# Patient Record
Sex: Male | Born: 1965 | Race: Black or African American | Hispanic: No | Marital: Married | Smoking: Never smoker
Health system: Southern US, Community
[De-identification: ages and names within clinical notes are randomized; demographics above are authoritative.]

## PROBLEM LIST (undated history)

## (undated) DIAGNOSIS — I10 Essential (primary) hypertension: Secondary | ICD-10-CM

## (undated) DIAGNOSIS — L700 Acne vulgaris: Secondary | ICD-10-CM

## (undated) DIAGNOSIS — G4730 Sleep apnea, unspecified: Secondary | ICD-10-CM

## (undated) DIAGNOSIS — K589 Irritable bowel syndrome without diarrhea: Secondary | ICD-10-CM

## (undated) DIAGNOSIS — K219 Gastro-esophageal reflux disease without esophagitis: Secondary | ICD-10-CM

## (undated) DIAGNOSIS — T7840XA Allergy, unspecified, initial encounter: Secondary | ICD-10-CM

## (undated) DIAGNOSIS — J069 Acute upper respiratory infection, unspecified: Secondary | ICD-10-CM

## (undated) DIAGNOSIS — J111 Influenza due to unidentified influenza virus with other respiratory manifestations: Secondary | ICD-10-CM

## (undated) HISTORY — PX: APPENDECTOMY: SHX54

## (undated) HISTORY — DX: Acute upper respiratory infection, unspecified: J06.9

## (undated) HISTORY — DX: Essential (primary) hypertension: I10

## (undated) HISTORY — DX: Irritable bowel syndrome, unspecified: K58.9

## (undated) HISTORY — PX: COLONOSCOPY: SHX174

## (undated) HISTORY — DX: Gastro-esophageal reflux disease without esophagitis: K21.9

## (undated) HISTORY — PX: OTHER SURGICAL HISTORY: SHX169

## (undated) HISTORY — DX: Sleep apnea, unspecified: G47.30

## (undated) HISTORY — DX: Allergy, unspecified, initial encounter: T78.40XA

## (undated) HISTORY — DX: Acne vulgaris: L70.0

## (undated) HISTORY — DX: Influenza due to unidentified influenza virus with other respiratory manifestations: J11.1

---

## 1999-02-16 ENCOUNTER — Encounter: Payer: Self-pay | Admitting: Emergency Medicine

## 1999-02-16 ENCOUNTER — Emergency Department (HOSPITAL_COMMUNITY): Admission: EM | Admit: 1999-02-16 | Discharge: 1999-02-16 | Payer: Self-pay | Admitting: Emergency Medicine

## 1999-07-26 ENCOUNTER — Emergency Department (HOSPITAL_COMMUNITY): Admission: EM | Admit: 1999-07-26 | Discharge: 1999-07-26 | Payer: Self-pay | Admitting: *Deleted

## 1999-11-12 ENCOUNTER — Encounter: Payer: Self-pay | Admitting: Gastroenterology

## 1999-11-12 ENCOUNTER — Emergency Department (HOSPITAL_COMMUNITY): Admission: RE | Admit: 1999-11-12 | Discharge: 1999-11-12 | Payer: Self-pay | Admitting: Gastroenterology

## 1999-12-10 ENCOUNTER — Inpatient Hospital Stay (HOSPITAL_COMMUNITY): Admission: RE | Admit: 1999-12-10 | Discharge: 1999-12-11 | Payer: Self-pay | Admitting: Surgery

## 1999-12-10 ENCOUNTER — Encounter (INDEPENDENT_AMBULATORY_CARE_PROVIDER_SITE_OTHER): Payer: Self-pay

## 2002-02-22 ENCOUNTER — Emergency Department (HOSPITAL_COMMUNITY): Admission: EM | Admit: 2002-02-22 | Discharge: 2002-02-22 | Payer: Self-pay | Admitting: Emergency Medicine

## 2003-07-11 ENCOUNTER — Ambulatory Visit (HOSPITAL_COMMUNITY): Admission: RE | Admit: 2003-07-11 | Discharge: 2003-07-11 | Payer: Self-pay | Admitting: Surgery

## 2003-07-11 ENCOUNTER — Encounter (INDEPENDENT_AMBULATORY_CARE_PROVIDER_SITE_OTHER): Payer: Self-pay | Admitting: *Deleted

## 2004-07-29 ENCOUNTER — Ambulatory Visit: Payer: Self-pay | Admitting: Internal Medicine

## 2004-09-22 ENCOUNTER — Ambulatory Visit: Payer: Self-pay | Admitting: Internal Medicine

## 2005-04-19 ENCOUNTER — Emergency Department (HOSPITAL_COMMUNITY): Admission: EM | Admit: 2005-04-19 | Discharge: 2005-04-19 | Payer: Self-pay | Admitting: Family Medicine

## 2005-05-11 ENCOUNTER — Ambulatory Visit: Payer: Self-pay | Admitting: Internal Medicine

## 2005-09-02 ENCOUNTER — Ambulatory Visit: Payer: Self-pay | Admitting: Internal Medicine

## 2005-09-06 ENCOUNTER — Ambulatory Visit: Payer: Self-pay | Admitting: Internal Medicine

## 2005-09-10 ENCOUNTER — Ambulatory Visit: Payer: Self-pay | Admitting: Internal Medicine

## 2006-05-06 ENCOUNTER — Ambulatory Visit: Payer: Self-pay | Admitting: Internal Medicine

## 2006-06-24 ENCOUNTER — Encounter: Payer: Self-pay | Admitting: Internal Medicine

## 2006-09-06 ENCOUNTER — Ambulatory Visit: Payer: Self-pay | Admitting: Internal Medicine

## 2006-09-15 ENCOUNTER — Ambulatory Visit: Payer: Self-pay | Admitting: Internal Medicine

## 2007-02-08 ENCOUNTER — Encounter: Payer: Self-pay | Admitting: *Deleted

## 2007-02-08 DIAGNOSIS — K219 Gastro-esophageal reflux disease without esophagitis: Secondary | ICD-10-CM

## 2007-02-08 DIAGNOSIS — K589 Irritable bowel syndrome without diarrhea: Secondary | ICD-10-CM | POA: Insufficient documentation

## 2007-06-13 ENCOUNTER — Ambulatory Visit: Payer: Self-pay | Admitting: Internal Medicine

## 2007-06-13 DIAGNOSIS — I10 Essential (primary) hypertension: Secondary | ICD-10-CM

## 2007-07-28 ENCOUNTER — Encounter: Payer: Self-pay | Admitting: Internal Medicine

## 2008-01-30 ENCOUNTER — Ambulatory Visit: Payer: Self-pay | Admitting: Internal Medicine

## 2008-01-30 DIAGNOSIS — L708 Other acne: Secondary | ICD-10-CM

## 2008-01-30 DIAGNOSIS — J069 Acute upper respiratory infection, unspecified: Secondary | ICD-10-CM | POA: Insufficient documentation

## 2008-02-05 ENCOUNTER — Encounter: Payer: Self-pay | Admitting: Internal Medicine

## 2008-02-07 ENCOUNTER — Telehealth: Payer: Self-pay | Admitting: Internal Medicine

## 2008-06-07 ENCOUNTER — Encounter: Payer: Self-pay | Admitting: Internal Medicine

## 2008-07-25 ENCOUNTER — Ambulatory Visit: Payer: Self-pay | Admitting: Internal Medicine

## 2008-07-25 LAB — CONVERTED CEMR LAB
ALT: 14 units/L (ref 0–53)
AST: 19 units/L (ref 0–37)
Albumin: 3.9 g/dL (ref 3.5–5.2)
Alkaline Phosphatase: 57 units/L (ref 39–117)
BUN: 12 mg/dL (ref 6–23)
Basophils Absolute: 0 10*3/uL (ref 0.0–0.1)
Basophils Relative: 0.5 % (ref 0.0–3.0)
Bilirubin Urine: NEGATIVE
Bilirubin, Direct: 0.1 mg/dL (ref 0.0–0.3)
CO2: 29 meq/L (ref 19–32)
Calcium: 9.3 mg/dL (ref 8.4–10.5)
Chloride: 107 meq/L (ref 96–112)
Cholesterol: 163 mg/dL (ref 0–200)
Creatinine, Ser: 1.2 mg/dL (ref 0.4–1.5)
Crystals: NEGATIVE
Eosinophils Absolute: 0.1 10*3/uL (ref 0.0–0.7)
Eosinophils Relative: 2.8 % (ref 0.0–5.0)
GFR calc Af Amer: 85 mL/min
GFR calc non Af Amer: 71 mL/min
Glucose, Bld: 105 mg/dL — ABNORMAL HIGH (ref 70–99)
HCT: 44.2 % (ref 39.0–52.0)
HDL: 37.1 mg/dL — ABNORMAL LOW (ref >39.0)
Hemoglobin, Urine: NEGATIVE
Hemoglobin: 15.2 g/dL (ref 13.0–17.0)
Ketones, ur: NEGATIVE mg/dL
LDL Cholesterol: 109 mg/dL — ABNORMAL HIGH (ref 0–99)
Lymphocytes Relative: 35.4 % (ref 12.0–46.0)
MCHC: 34.4 g/dL (ref 30.0–36.0)
MCV: 88.3 fL (ref 78.0–100.0)
Monocytes Absolute: 0.6 10*3/uL (ref 0.1–1.0)
Monocytes Relative: 13.7 % — ABNORMAL HIGH (ref 3.0–12.0)
Neutro Abs: 1.9 10*3/uL (ref 1.4–7.7)
Neutrophils Relative %: 47.6 % (ref 43.0–77.0)
Nitrite: NEGATIVE
Platelets: 267 10*3/uL (ref 150–400)
Potassium: 3.5 meq/L (ref 3.5–5.1)
RBC: 5 M/uL (ref 4.22–5.81)
RDW: 12.8 % (ref 11.5–14.6)
Sodium: 142 meq/L (ref 135–145)
Specific Gravity, Urine: 1.025 (ref 1.000–1.035)
Squamous Epithelial / HPF: NEGATIVE /lpf
TSH: 1.64 microintl units/mL (ref 0.35–5.50)
Total Bilirubin: 0.8 mg/dL (ref 0.3–1.2)
Total CHOL/HDL Ratio: 4.4
Total Protein, Urine: NEGATIVE mg/dL
Total Protein: 7.2 g/dL (ref 6.0–8.3)
Triglycerides: 86 mg/dL (ref 0–149)
Urine Glucose: NEGATIVE mg/dL
Urobilinogen, UA: 0.2 (ref 0.0–1.0)
VLDL: 17 mg/dL (ref 0–40)
WBC: 4.1 10*3/uL — ABNORMAL LOW (ref 4.5–10.5)
pH: 5.5 (ref 5.0–8.0)

## 2008-08-20 ENCOUNTER — Ambulatory Visit: Payer: Self-pay | Admitting: Internal Medicine

## 2008-08-28 ENCOUNTER — Encounter: Payer: Self-pay | Admitting: Internal Medicine

## 2008-08-29 ENCOUNTER — Telehealth: Payer: Self-pay | Admitting: Internal Medicine

## 2008-09-19 ENCOUNTER — Telehealth: Payer: Self-pay | Admitting: Internal Medicine

## 2008-10-01 ENCOUNTER — Encounter: Payer: Self-pay | Admitting: Internal Medicine

## 2008-10-02 ENCOUNTER — Telehealth: Payer: Self-pay | Admitting: Internal Medicine

## 2008-10-27 ENCOUNTER — Emergency Department (HOSPITAL_COMMUNITY): Admission: EM | Admit: 2008-10-27 | Discharge: 2008-10-27 | Payer: Self-pay | Admitting: Family Medicine

## 2008-10-27 ENCOUNTER — Emergency Department (HOSPITAL_COMMUNITY): Admission: EM | Admit: 2008-10-27 | Discharge: 2008-10-27 | Payer: Self-pay | Admitting: Emergency Medicine

## 2009-04-22 ENCOUNTER — Telehealth: Payer: Self-pay | Admitting: Internal Medicine

## 2009-04-25 ENCOUNTER — Ambulatory Visit: Payer: Self-pay | Admitting: Internal Medicine

## 2009-06-24 ENCOUNTER — Telehealth (INDEPENDENT_AMBULATORY_CARE_PROVIDER_SITE_OTHER): Payer: Self-pay | Admitting: *Deleted

## 2009-06-24 ENCOUNTER — Ambulatory Visit: Payer: Self-pay | Admitting: Internal Medicine

## 2009-06-24 DIAGNOSIS — M25519 Pain in unspecified shoulder: Secondary | ICD-10-CM

## 2009-06-25 ENCOUNTER — Telehealth: Payer: Self-pay | Admitting: Internal Medicine

## 2009-07-09 ENCOUNTER — Telehealth: Payer: Self-pay | Admitting: Internal Medicine

## 2009-08-15 ENCOUNTER — Encounter: Payer: Self-pay | Admitting: Internal Medicine

## 2009-08-18 ENCOUNTER — Telehealth: Payer: Self-pay | Admitting: Internal Medicine

## 2009-08-26 ENCOUNTER — Ambulatory Visit: Payer: Self-pay | Admitting: Internal Medicine

## 2009-08-26 DIAGNOSIS — E559 Vitamin D deficiency, unspecified: Secondary | ICD-10-CM

## 2009-08-26 LAB — CONVERTED CEMR LAB
BUN: 12 mg/dL (ref 6–23)
CO2: 28 meq/L (ref 19–32)
Calcium: 9.2 mg/dL (ref 8.4–10.5)
Chloride: 104 meq/L (ref 96–112)
Creatinine, Ser: 1 mg/dL (ref 0.4–1.5)
GFR calc non Af Amer: 104.39 mL/min (ref >60)
Glucose, Bld: 94 mg/dL (ref 70–99)
Potassium: 3.9 meq/L (ref 3.5–5.1)
Sodium: 138 meq/L (ref 135–145)

## 2009-08-27 DIAGNOSIS — G4733 Obstructive sleep apnea (adult) (pediatric): Secondary | ICD-10-CM

## 2009-08-27 DIAGNOSIS — N529 Male erectile dysfunction, unspecified: Secondary | ICD-10-CM

## 2009-11-14 ENCOUNTER — Telehealth: Payer: Self-pay | Admitting: Internal Medicine

## 2010-01-30 ENCOUNTER — Telehealth: Payer: Self-pay | Admitting: Internal Medicine

## 2010-05-08 ENCOUNTER — Telehealth: Payer: Self-pay | Admitting: Internal Medicine

## 2010-06-23 NOTE — Progress Notes (Signed)
  Phone Note Refill Request Message from:  Fax from Pharmacy  Refills Requested: Medication #1:  BYSTOLIC 5 MG TABS 1 by mouth once daily   Last Refilled: 12/06/2009 Initial call taken by: Ami Bullins CMA,  January 30, 2010 11:35 AM  Follow-up for Phone Call        ok for as needed refills Follow-up by: Jacques Navy MD,  January 30, 2010 1:07 PM    Prescriptions: BYSTOLIC 5 MG TABS (NEBIVOLOL HCL) 1 by mouth once daily  #30 x 7   Entered by:   Ami Bullins CMA   Authorized by:   Jacques Navy MD   Signed by:   Bill Salinas CMA on 01/30/2010   Method used:   [facility] [address] [id number]       [address]       [place], [place]  [address]       Ph: [phone]       Fax: [phone]   RxID:   [id number]

## 2010-06-23 NOTE — Progress Notes (Signed)
Summary: BP med  Phone Note Call from Patient   Summary of Call: I spoke with patient in regards to his BP meds. Patient misunderstood MD yesterday and thought that he should d/c his Benicar. I advised the patient that office visit note says to add Bystolic to daily regimen and report back with BP readings. Patient is now aware to take both Benicar and Bystolic. Initial call taken by: Lucious Groves,  June 25, 2009 9:01 AM

## 2010-06-23 NOTE — Progress Notes (Signed)
Summary: RX request  Phone Note Call from Patient Call back at Home Phone [phone] Call back at [phone]   Summary of Call: Patient called for prescriptions of Bystolic and Nasonex. Patient aware we will send to pharmacy. Please advise regarding Nasonex. Initial call taken by: Lucious Groves,  July 09, 2009 2:11 PM  Follow-up for Phone Call        I don't find nasonex on med list nor mentioned in my last office note. When was this started? Does he carry the diagnosis of allergic rhinnitis. OK to send in bystolic now and will need more information re: nasonex Follow-up by: Jacques Navy MD,  July 09, 2009 3:31 PM  Additional Follow-up for Phone Call Additional follow up Details #1::        When I spoke with the patient earlier he insisted that he was placed on this med previously but did not need it for an extended period of time. Left message on machine to call back to office. Additional Follow-up by: Lucious Groves,  July 09, 2009 4:09 PM    Additional Follow-up for Phone Call Additional follow up Details #2::    1.Spoke with pt: He is c/o problems with urine flow. Trouble starting and sometimes flow stops & starts. These issues started after taking bystolic. Are they a side effect?  2. Pt c/o seasonal allergies and uses flonase during spring. Ok for rx?   .......................Marland KitchenLamar Sprinkles, CMA  July 11, 2009 1:37 PM   Additional Follow-up for Phone Call Additional follow up Details #3:: Details for Additional Follow-up Action Taken: 1. No symptoms of bystolic 2. OK for flonase  tried calling - left message on cell phone Additional Follow-up by: Jacques Navy MD,  July 11, 2009 6:34 PM  New/Updated Medications: FLONASE 50 MCG/ACT SUSP (FLUTICASONE PROPIONATE) 2 sprays to each nostril daily Prescriptions: FLONASE 50 MCG/ACT SUSP (FLUTICASONE PROPIONATE) 2 sprays to each nostril daily  #1 x 12   Entered and Authorized by:   Jacques Navy MD  Signed by:   Jacques Navy MD on 07/11/2009   Method used:   [facility]        [facility] [address] [phone][facility] ([facility]ail[facility]       [address]       [place], [place]  [address]       Ph: [phone]       Fax: [phone]   RxID:   [id number] BYSTOLIC 5 MG TABS (NEBIVOLOL HCL) 1 by mouth once daily  #30 x 3   Entered by:   Lucious Groves   Authorized by:   Jacques Navy MD   Signed by:   Lucious Groves on 07/09/2009   Method used:   [facility]        [facility] [facility] [address] [phone][facility] (retail[facility]       [address]       [place], [place]  [address]       Ph: [phone]       Fax: [phone]   RxID:   [id number]

## 2010-06-23 NOTE — Letter (Signed)
Summary: [facility]  [facility]   Imported By: Maryln Gottron 08/22/2009 09:56:27  _____________________________________________________________________  External Attachment:    Type:   Image     Comment:   External Document

## 2010-06-23 NOTE — Assessment & Plan Note (Signed)
Summary: PHYSICAL--STC   Vital Signs:  Patient profile:   45 year old male Height:      69 inches Weight:      209.75 pounds BMI:     31.09 O2 Sat:      94 % on Room air Temp:     97.0 degrees F oral Pulse rate:   77 / minute Pulse rhythm:   regular BP sitting:   128 / 84 Cuff size:   large  Vitals Entered By: Brenton Grills (August 26, 2009 1:19 PM)  O2 Flow:  Room air CC: pt here for cpx/last eye exam was in 2010/aj  Vision Screening:      Vision Comments: last eye exam was in 2010   Primary Care Provider:  Katricia Prehn  CC:  pt here for cpx/last eye exam was in 2010/aj.  History of Present Illness: Patient presents for routine medical follow-up. He has seen Dr. Patsi Sears - he is followed for BPH and ED. He has been trained to do PEP cavernous injection. His BP has been variable but controlled today. He has had left rotator cuff surgery but is still having pain and limitation in ROM. He continues to do physical therapy.   He has had a sleep study. Question if he needs CPAP.    Current Medications (verified): 1)  Excedrin Extra Strength 250-250-65 Mg  Tabs (Aspirin-Acetaminophen-Caffeine) .... As Needed 2)  Claritin-D 12 Hour 5-120 Mg Xr12h-Tab (Loratadine-Pseudoephedrine) .... Take 1 Tablet By Mouth Once A Day 3)  Benicar 20 Mg Tabs (Olmesartan Medoxomil) .[name].. 1 Once Daily 4)  Vicodin 5-500 Mg Tabs (Hydrocodone-Acetaminophen) .... As Needed 5)  Oxycodone-Acetaminophen 5-325 Mg Tabs (Oxycodone-Acetaminophen) .[name].. 1 To 2 Tabs Q 4 Hrs As Needed 6)  Methocarbamol 500 Mg Tabs (Methocarbamol) .[name].. 1 Tab By Mouth Every 8 Hours 7)  Bystolic 5 Mg Tabs (Nebivolol Hcl) .[name].. 1 By Mouth Once Daily 8)  Maxalt 10 Mg Tabs (Rizatriptan Benzoate) .[name].. 1 By Mouth At On-Set of Headach 9)  Flonase 50 Mcg/act Susp (Fluticasone Propionate) .... 2 Sprays To Each Nostril Daily 10)  Vitamin D2 1.5 Mg (50000 Units) .... Take 1 Cap Weekly  Allergies (verified): No Known Drug Allergies  Past  History:  Past Medical History: Last updated: 08/20/2008 CYSTIC ACNE (ICD-706.1) URI (ICD-465.9) UNSPECIFIED ESSENTIAL HYPERTENSION (ICD-401.9) IBS (ICD-564.1) GERD (ICD-530.81)  Past Surgical History: Last updated: 06/24/2009 Appendectomy Arthroscopic surgery left shoulder for bone spurs Jan '11  Family History: Last updated: 01/30/2008 father - 72; HTN mother - deceased @54 : breast cancer Sister - HTN Neg- colon or prostate cancer; DM, CAD  Social History: Last updated: 01/30/2008 HSG, Adah Perl STate - [employer] business married -'94 1 daughter - '2002 work: Corporate treasurer Engineer, production)  Risk Factors: Alcohol Use: 0 (08/20/2008) Diet: follows a pretty good diet (08/20/2008) Exercise: yes (08/20/2008)  Risk Factors: Smoking Status: quit (08/20/2008)  Review of Systems  The patient denies anorexia, fever, weight loss, weight gain, hoarseness, chest pain, syncope, dyspnea on exertion, prolonged cough, hemoptysis, abdominal pain, hematochezia, incontinence, muscle weakness, suspicious skin lesions, difficulty walking, unusual weight change, enlarged lymph nodes, and angioedema.    Physical Exam  General:  Well-developed,well-nourished,in no acute distress; alert,appropriate and cooperative throughout examination. Atheletic Head:  Normocephalic and atraumatic without obvious abnormalities. No apparent alopecia or balding. Eyes:  No corneal or conjunctival inflammation noted. EOMI. Perrla. Funduscopic exam benign, without hemorrhages, exudates or papilledema. Vision grossly normal. Ears:  R ear normal and L ear normal.   Nose:  no external  deformity and no external erythema.   Mouth:  Oral mucosa and oropharynx without lesions or exudates.  Teeth in good repair. Neck:  supple and full ROM.   Chest Wall:  no deformities and no tenderness.   Lungs:  Normal respiratory effort, chest expands symmetrically. Lungs are clear to auscultation, no crackles or wheezes. Heart:   Normal rate and regular rhythm. S1 and S2 normal without gallop, murmur, click, rub or other extra sounds. Abdomen:  soft, non-tender, normal bowel sounds, no guarding, and no rigidity.   Msk:  decreased ROM left shoulder otherwise normal exam Pulses:  2+ radial Extremities:  No clubbing, cyanosis, edema, or deformity noted with normal full range of motion of all joints.   Neurologic:  alert & oriented X3, cranial nerves II-XII intact, strength normal in all extremities, sensation intact to light touch, gait normal, and DTRs symmetrical and normal.   Skin:  turgor normal, color normal, no rashes, no petechiae, and no ulcerations.   Cervical Nodes:  No lymphadenopathy noted Psych:  Oriented X3, memory intact for recent and remote, normally interactive, and good eye contact.     Impression & Recommendations:  Problem # 1:  SHOULDER PAIN, LEFT (ICD-719.41) On-going problem with residual reduced function. He is still unable to return to any activity that requires normal strength or ROM. He does continue with PT.  His updated medication list for this problem includes:    Excedrin Extra Strength 250-250-65 Mg Tabs (Aspirin-acetaminophen-caffeine) .[name]... As needed    Vicodin 5-500 Mg Tabs (Hydrocodone-acetaminophen) .[name]... As needed    Oxycodone-acetaminophen 5-325 Mg Tabs (Oxycodone-acetaminophen) .[name]... 1 to 2 tabs q 4 hrs as needed    Methocarbamol 500 Mg Tabs (Methocarbamol) .[name]... 1 tab by mouth every 8 hours  Problem # 2:  UNSPECIFIED ESSENTIAL HYPERTENSION (ICD-401.9)  His updated medication list for this problem includes:    Benicar 20 Mg Tabs (Olmesartan medoxomil) .[name]... 1 once daily    Bystolic 5 Mg Tabs (Nebivolol hcl) .[name]... 1 by mouth once daily  Orders: TLB-BMP (Basic Metabolic Panel-BMET) (80048-METABOL)  BP today: 128/84 Prior BP: 148/94 (06/24/2009)  Good control on present medications.  Problem # 3:  IBS (ICD-564.1) Stable with no active compliants, on no  medications.  Problem # 4:  ERECTILE DYSFUNCTION, ORGANIC (ICD-607.84) Dr. Cyndie Mull is managing this problem. Patient has been taught to self-administer PEP cavernous injections. He did have a PSA checked which was in normal range.  Problem # 5:  SLEEP APNEA, OBSTRUCTIVE (ICD-327.23) Previous sleep study done at [facility] received and reviewed. He does have mild-moderate OSA with 16 events/hr. He never followed up with Dr. Marylou Flesher for possible treatment.  Plan - sleep consult with either Dr. Marylou Flesher or [facility] for possible CPAP trial  Problem # 6:  VITAMIN D DEFICIENCY (ICD-268.9) Level checked at Urology = <10.  Plan - ergocalciferol 50,000 weekly as prescribed by Dr. Patsi Sears x 16 weeks  Problem # 7:  Preventive Health Care (ICD-V70.0) Normal limited exam. Normal GU exam including prostate and normal PSA. Current for general health care. He will be resuming stationary bike and does work on [employer].  IN summary - a very nice man who is generally medically stable. He will return as needed. He will let us know if he needs assistance scheduling sleep consult follow-up.  Complete Medication List: 1)  Excedrin Extra Strength 250-250-65 Mg Tabs (Aspirin-acetaminophen-caffeine) .... As needed 2)  Claritin-d 12 Hour 5-120 Mg Xr12h-tab (Loratadine-pseudoephedrine) .... Take 1 tablet by mouth once  a day 3)  Benicar 20 Mg Tabs (Olmesartan medoxomil) .[name].. 1 once daily 4)  Vicodin 5-500 Mg Tabs (Hydrocodone-acetaminophen) .... As needed 5)  Oxycodone-acetaminophen 5-325 Mg Tabs (Oxycodone-acetaminophen) .[name].. 1 to 2 tabs q 4 hrs as needed 6)  Methocarbamol 500 Mg Tabs (Methocarbamol) .[name].. 1 tab by mouth every 8 hours 7)  Bystolic 5 Mg Tabs (Nebivolol hcl) .[name].. 1 by mouth once daily 8)  Maxalt 10 Mg Tabs (Rizatriptan benzoate) .[name].. 1 by mouth at on-set of headach 9)  Flonase 50 Mcg/act Susp (Fluticasone propionate) .... 2 sprays to each nostril daily 10)  Vitamin D2 1.5  Mg (50000 Units)  .... Take 1 cap weekly  Patient: Corey Oliver Note: All result statuses are Final unless otherwise noted.  Tests: (1) BMP (METABOL)   Sodium                    138 mEq/L                   135-145   Potassium                 3.9 mEq/L                   3.5-5.1   Chloride                  104 mEq/L                   96-112   Carbon Dioxide            28 mEq/L                    19-32   Glucose                   94 mg/dL                    09-81   BUN                       12 mg/dL                    1-91   Creatinine                1.0 mg/dL                   4.7-8.2   Calcium                   9.2 mg/dL                   9.5-62.1   GFR                       104.39 mL/min               >60Prescriptions: MAXALT 10 MG TABS (RIZATRIPTAN BENZOATE) 1 by mouth at on-set of headach  #12 x 12   Entered and Authorized by:   Jacques Navy MD   Signed by:   Jacques Navy MD on 08/26/2009   Method used:   [facility]        [facility]  [address] [phone][facility] ([facility]ail)       [address]       [place], [place]  [address]  Ph: [phone]       Fax: [phone]   RxID:   [id number]

## 2010-06-23 NOTE — Assessment & Plan Note (Signed)
Summary: xray   Vital Signs:  Patient profile:   45 year old male Height:      69 inches Weight:      208 pounds BMI:     30.83 O2 Sat:      97 % on Room air Temp:     97.9 degrees F oral Pulse rate:   94 / minute BP sitting:   148 / 94  (left arm) Cuff size:   large  Vitals Entered By: Bill Salinas CMA (June 24, 2009 11:18 AM)  O2 Flow:  Room air CC: pt here for evaluation of elevated BP, he states his BP has been up since shoulder surg 2 weeks ago. Pt has also been having migraines with one instance of vomitting/ ab   Primary Care Provider:  Kristell Wooding  CC:  pt here for evaluation of elevated BP and he states his BP has been up since shoulder surg 2 weeks ago. Pt has also been having migraines with one instance of vomitting/ ab.  History of Present Illness: Recently had arthroscopic left shoulder surgery. At that time he had breathing trouble and elevated blood pressure. He has had several severe headaches: with nausea and emesis. He stopped taking excedrin as a possible driver of blood pressure.   Patient does share that he had a positive sleep study but never followed-up for CPAP assessment. Patient will get release.  He  does have a h/o migraine but reports that he did not have a good response to imitrex.    Current Medications (verified): 1)  Excedrin Extra Strength 250-250-65 Mg  Tabs (Aspirin-Acetaminophen-Caffeine) .... As Needed 2)  Claritin-D 12 Hour 5-120 Mg Xr12h-Tab (Loratadine-Pseudoephedrine) .... Take 1 Tablet By Mouth Once A Day 3)  Benicar 20 Mg Tabs (Olmesartan Medoxomil) .[name].. 1 Once Daily 4)  Vicodin 5-500 Mg Tabs (Hydrocodone-Acetaminophen) .... As Needed 5)  Oxycodone-Acetaminophen 5-325 Mg Tabs (Oxycodone-Acetaminophen) .[name].. 1 To 2 Tabs Q 4 Hrs As Needed 6)  Methocarbamol 500 Mg Tabs (Methocarbamol) .[name].. 1 Tab By Mouth Every 8 Hours  Allergies (verified): No Known Drug Allergies  Past History:  Past Medical History: Last updated:  08/20/2008 CYSTIC ACNE (ICD-706.1) URI (ICD-465.9) UNSPECIFIED ESSENTIAL HYPERTENSION (ICD-401.9) IBS (ICD-564.1) GERD (ICD-530.81)  Past Surgical History: Appendectomy Arthroscopic surgery left shoulder for bone spurs Jan '11  Review of Systems       The patient complains of weight gain and headaches.  The patient denies anorexia, fever, decreased hearing, hoarseness, chest pain, syncope, dyspnea on exertion, prolonged cough, abdominal pain, severe indigestion/heartburn, muscle weakness, suspicious skin lesions, difficulty walking, unusual weight change, and enlarged lymph nodes.    Physical Exam  General:  WNWD atheletic appearing AA male in no distress Head:  normocephalic and atraumatic.   Eyes:  pupils equal and pupils round.   Neck:  supple and full ROM.   Lungs:  normal respiratory effort and normal breath sounds.   Heart:  normal rate and regular rhythm.   Msk:  decdreased ROM left shoulder Pulses:  2+ radial Neurologic:  alert & oriented X3, cranial nerves II-XII intact, and gait normal.   Skin:  turgor normal, color normal, and no rashes.   Cervical Nodes:  no anterior cervical adenopathy and no posterior cervical adenopathy.   Psych:  Oriented X3, memory intact for recent and remote, normally interactive, and good eye contact.     Impression & Recommendations:  Problem # 1:  UNSPECIFIED ESSENTIAL HYPERTENSION (ICD-401.9)  His updated medication list for this problem includes:  Benicar 20 Mg Tabs (Olmesartan medoxomil) .[name]... 1 once daily    Bystolic 5 Mg Tabs (Nebivolol hcl) .[name]... 1 by mouth once daily  BP today: 148/94 Prior BP: 138/98 (04/25/2009)  Labs Reviewed: K+: 3.5 (07/25/2008) Creat: : 1.2 (07/25/2008)   Chol: 163 (07/25/2008)   HDL: 37.1 (07/25/2008)   LDL: 109 (07/25/2008)   TG: 86 (07/25/2008)  Suboptimal control  Plan - add bystolic 5mg  once daily to regimen. Report back BP readings  Problem # 2:  SHOULDER PAIN, LEFT (ICD-719.41) s/p  surgery. To start PT Monday Feb 7th  His updated medication list for this problem includes:    Excedrin Extra Strength 250-250-65 Mg Tabs (Aspirin-acetaminophen-caffeine) .[name]... As needed    Vicodin 5-500 Mg Tabs (Hydrocodone-acetaminophen) .[name]... As needed    Oxycodone-acetaminophen 5-325 Mg Tabs (Oxycodone-acetaminophen) .[name]... 1 to 2 tabs q 4 hrs as needed    Methocarbamol 500 Mg Tabs (Methocarbamol) .[name]... 1 tab by mouth every 8 hours  Complete Medication List: 1)  Excedrin Extra Strength 250-250-65 Mg Tabs (Aspirin-acetaminophen-caffeine) .... As needed 2)  Claritin-d 12 Hour 5-120 Mg Xr12h-tab (Loratadine-pseudoephedrine) .... Take 1 tablet by mouth once a day 3)  Benicar 20 Mg Tabs (Olmesartan medoxomil) .[name].. 1 once daily 4)  Vicodin 5-500 Mg Tabs (Hydrocodone-acetaminophen) .... As needed 5)  Oxycodone-acetaminophen 5-325 Mg Tabs (Oxycodone-acetaminophen) .[name].. 1 to 2 tabs q 4 hrs as needed 6)  Methocarbamol 500 Mg Tabs (Methocarbamol) .[name].. 1 tab by mouth every 8 hours 7)  Bystolic 5 Mg Tabs (Nebivolol hcl) .[name].. 1 by mouth once daily 8)  Maxalt 10 Mg Tabs (Rizatriptan benzoate) .[name].. 1 by mouth at on-set of headach

## 2010-06-23 NOTE — Progress Notes (Signed)
Summary: Ibuprofen refill  Phone Note Call from Patient Call back at Home Phone [phone] Call back at [phone]   Summary of Call: Patient requests refill of Ibuprofen 800mg . This is currently not on med list. Please advise. Initial call taken by: Lucious Groves,  November 14, 2009 3:43 PM  Follow-up for Phone Call        OK for ibuprofen 800mg  #100, sig 1 by mouth three times a day, refill as needed  Follow-up by: Jacques Navy MD,  November 14, 2009 4:10 PM  Additional Follow-up for Phone Call Additional follow up Details #1::        Did not reach patient at home, tried cell, left message on voicemail notifying. Additional Follow-up by: Lucious Groves,  November 14, 2009 4:14 PM    New/Updated Medications: IBUPROFEN 800 MG TABS (IBUPROFEN) 1 by mouth three times a day Prescriptions: IBUPROFEN 800 MG TABS (IBUPROFEN) 1 by mouth three times a day  #100 x 11   Entered by:   Lucious Groves   Authorized by:   Jacques Navy MD   Signed by:   Lucious Groves on 11/14/2009   Method used:   [facility]        [facility]  [address] [phone]       [address]       [place], [place]  [address]       Ph: [phone]       Fax: [phone]   RxID:   [id number]

## 2010-06-23 NOTE — Progress Notes (Signed)
Summary: BP  Phone Note Call from Patient   Summary of Call: Pt took his last Bp pills today. Says at urologist he was told that bp was "high". He has machine at home and will bring in or call office with readings. He does not want to fill rx's if they will possibly be changed. Provided pt with 3 wks of samples.  Initial call taken by: Lamar Sprinkles, CMA,  August 18, 2009 5:06 PM  Follow-up for Phone Call        Patient on bystolic 5, benicar 20 and had been doing OK. Will look to see home readings and make appropriate adjustments Follow-up by: Jacques Navy MD,  August 18, 2009 6:22 PM

## 2010-06-23 NOTE — Progress Notes (Signed)
Summary: Medical release completed  Medical release completed and faxed to [facility] at [phone]. Dena Chavis  June 24, 2009 12:11 PM

## 2010-06-25 NOTE — Progress Notes (Signed)
Summary: Referral ?  Phone Note Call from Patient Call back at Home Phone [phone]   Summary of Call: Pt has talked to MD regarding blood in his stool. He wants to know if MD thinks he should have colonscopy.  Initial call taken by: Lamar Sprinkles, CMA,  May 08, 2010 4:54 PM  Follow-up for Phone Call        patient seen for hematochezia Apr 25, 2009. No report in subsequent two visits of recurrent blood in the stool.  Recent blood in stool? If yes can schedule diagnostic colonoscopy. Follow-up by: Jacques Navy MD,  May 08, 2010 5:55 PM  Additional Follow-up for Phone Call Additional follow up Details #1::        no answer...............Marland KitchenLamar Sprinkles, CMA  May 11, 2010 5:31 PM   Spoke w/pt. He had one episode of bright red blood during BM which was hard dry stool. No further bleeding. Patient is requesting rx for suppositiores & to know if MD thinks he needs colonoscopy?  Additional Follow-up by: Lamar Sprinkles, CMA,  May 12, 2010 12:46 PM    Additional Follow-up for Phone Call Additional follow up Details #2::    bright red blood on the stool after bowel movement is usually hemorrhoid or tear. If he wants colonoscopy this can meet criteria for diagnositc study. Will refer to GI if he wishes to proceed.  Anusol HC 25mg  suppositories #12, 1 pr two times a day x 6, refill x 2 Follow-up by: Jacques Navy MD,  May 12, 2010 1:10 PM  Additional Follow-up for Phone Call Additional follow up Details #3:: Details for Additional Follow-up Action Taken: Pt informed, he will wait on colon & try med Additional Follow-up by: Lamar Sprinkles, CMA,  May 12, 2010 3:02 PM  New/Updated Medications: ANUSOL-HC 25 MG SUPP (HYDROCORTISONE ACETATE) once per rectum two times a day x 6 days Prescriptions: ANUSOL-HC 25 MG SUPP (HYDROCORTISONE ACETATE) once per rectum two times a day x 6 days  #12 x 2   Entered by:   Lamar Sprinkles, CMA   Authorized by:   Jacques Navy MD   Signed by:   Lamar Sprinkles, CMA on 05/12/2010   Method used:   [facility]        [facility]  [address] [phone][facility] ([facility]ail[facility]       [address]       [place], [place]  [address]       Ph: [phone]       Fax: [phone]   RxID:   [id number]

## 2010-08-03 ENCOUNTER — Telehealth: Payer: Self-pay | Admitting: Internal Medicine

## 2010-08-07 ENCOUNTER — Telehealth: Payer: Self-pay | Admitting: Internal Medicine

## 2010-08-11 NOTE — Progress Notes (Signed)
  Phone Note Refill Request Message from:  Fax from Pharmacy on August 07, 2010 8:54 AM  Refills Requested: Medication #1:  FLONASE 50 MCG/ACT SUSP 2 sprays to each nostril daily Initial call taken by: Ami Bullins CMA,  August 07, 2010 8:54 AM    Prescriptions: FLONASE 50 MCG/ACT SUSP (FLUTICASONE PROPIONATE) 2 sprays to each nostril daily  #1 x 12   Entered by:   Ami Bullins CMA   Authorized by:   Jacques Navy MD   Signed by:   Bill Salinas CMA on 08/07/2010   Method used:   [facility]        [facility]  [address] [id number]       [address]       [place], [place]  [address]       Ph: [phone]       Fax: [phone]   RxID:   [id number]

## 2010-08-11 NOTE — Progress Notes (Signed)
Summary: Rx refill  Phone Note Call from Patient Call back at Home Phone [phone]   Caller: [phone] or [phone] Call For: Jacques Navy MD Summary of Call: Pt sched appt for his yearly Cpx in May. Pt is running out of Bp meds. Can pt get a refill [facility] to [facility] to hold him over to appt please? Initial call taken by: Verdell Face,  August 03, 2010 2:35 PM    Prescriptions: BENICAR 20 MG TABS (OLMESARTAN MEDOXOMIL) 1 once daily  #30 x 1   Entered by:   Margaret Pyle, CMA   Authorized by:   Jacques Navy MD   Signed by:   Margaret Pyle, CMA on 08/03/2010   Method used:   Electronically to        [facility]  [address] [phone]* (retail)       [address]       [place], [place]  [address]       Ph: [phone]       Fax: [phone]   RxID:   [id number] BYSTOLIC 5 MG TABS (NEBIVOLOL HCL) 1 by mouth once daily  #30 x 1   Entered by:   Margaret Pyle, CMA   Authorized by:   Jacques Navy MD   Signed by:   Margaret Pyle, CMA on 08/03/2010   Method used:   [facility]ically [facility]        [facility]  [address] [phone]* (retail)       [address]       [place], [place]  [address]       Ph: [phone]       Fax: [phone]   RxID:   [id number]

## 2010-09-15 ENCOUNTER — Other Ambulatory Visit (INDEPENDENT_AMBULATORY_CARE_PROVIDER_SITE_OTHER): Payer: 59

## 2010-09-15 DIAGNOSIS — Z0389 Encounter for observation for other suspected diseases and conditions ruled out: Secondary | ICD-10-CM

## 2010-09-15 DIAGNOSIS — Z0000 Encounter for general adult medical examination without abnormal findings: Secondary | ICD-10-CM

## 2010-09-15 LAB — CBC WITH DIFFERENTIAL/PLATELET
Basophils Relative: 1 % (ref 0.0–3.0)
HCT: 44.1 % (ref 39.0–52.0)
Hemoglobin: 15.1 g/dL (ref 13.0–17.0)
Lymphocytes Relative: 31.5 % (ref 12.0–46.0)
Lymphs Abs: 1.5 10*3/uL (ref 0.7–4.0)
MCHC: 34.2 g/dL (ref 30.0–36.0)
Monocytes Relative: 10.1 % (ref 3.0–12.0)
Neutro Abs: 2.7 10*3/uL (ref 1.4–7.7)
RBC: 4.94 Mil/uL (ref 4.22–5.81)
RDW: 13.3 % (ref 11.5–14.6)

## 2010-09-15 LAB — HEPATIC FUNCTION PANEL
ALT: 15 U/L (ref 0–53)
AST: 22 U/L (ref 0–37)
Albumin: 4.2 g/dL (ref 3.5–5.2)
Alkaline Phosphatase: 60 U/L (ref 39–117)

## 2010-09-15 LAB — BASIC METABOLIC PANEL
BUN: 16 mg/dL (ref 6–23)
Chloride: 106 mEq/L (ref 96–112)
GFR: 95.06 mL/min (ref >60.00)
Glucose, Bld: 96 mg/dL (ref 70–99)
Potassium: 4.4 mEq/L (ref 3.5–5.1)
Sodium: 140 mEq/L (ref 135–145)

## 2010-09-15 LAB — URINALYSIS
Bilirubin Urine: NEGATIVE
Hgb urine dipstick: NEGATIVE
Ketones, ur: NEGATIVE
Total Protein, Urine: NEGATIVE
Urine Glucose: NEGATIVE
pH: 6 (ref 5.0–8.0)

## 2010-09-15 LAB — LIPID PANEL
Cholesterol: 180 mg/dL (ref 0–200)
VLDL: 8.4 mg/dL (ref 0.0–40.0)

## 2010-09-15 LAB — PSA: PSA: 1.21 ng/mL (ref 0.10–4.00)

## 2010-09-23 ENCOUNTER — Ambulatory Visit (INDEPENDENT_AMBULATORY_CARE_PROVIDER_SITE_OTHER): Payer: 59 | Admitting: Internal Medicine

## 2010-09-23 DIAGNOSIS — Z0000 Encounter for general adult medical examination without abnormal findings: Secondary | ICD-10-CM

## 2010-09-23 DIAGNOSIS — N529 Male erectile dysfunction, unspecified: Secondary | ICD-10-CM

## 2010-09-23 DIAGNOSIS — I10 Essential (primary) hypertension: Secondary | ICD-10-CM

## 2010-09-23 MED ORDER — NEBIVOLOL HCL 5 MG PO TABS: 5.0000 mg | ORAL_TABLET | Freq: Every day | ORAL | Status: DC

## 2010-09-23 MED ORDER — TESTOSTERONE 5 MG/24HR TD PT24: 1.0000 | MEDICATED_PATCH | Freq: Every day | TRANSDERMAL | Status: DC

## 2010-09-23 MED ORDER — RIZATRIPTAN BENZOATE 10 MG PO TABS: 10.0000 mg | ORAL_TABLET | ORAL | Status: DC | PRN

## 2010-09-23 NOTE — Progress Notes (Signed)
Subjective:    Patient ID: Corey Oliver, male    DOB: 1965-10-28, 45 y.o.   MRN: [id number]  HPI Corey Oliver presents for general medical follow-up. He has a h/o left shoulder injury for which he had arthroscopic rotator cuff repair. He did return to work in July but had to go back out of work in March. He is back into physical therapy. Surgery is not being proposed at this point. He has declined steroid injections. His limitation is that he can not hold his arm up or lift more than 10 lbs.   He does compliatn that he is low on energy in general. He has been taking his medications without side effect but he is not at goal in regard to control. He has been seen by Dr. Patsi Sears in the past: erectile dysfunction. He had a low Vitamin D level, he had low normal range testosterone. Corey Oliver is concerned that his decreased energy level is related to hypogonadism.  Past Medical History  Diagnosis Date  . Cystic acne   . URI (upper respiratory infection)   . Hypertension   . IBS (irritable bowel syndrome)   . GERD (gastroesophageal reflux disease)    Past Surgical History  Procedure Date  . Appendectomy   . Arthroscopic surgery left shoulder     for bone spurs jan '11   No family history on file. History   Social History  . Marital Status: Married    Spouse Name: N/A    Number of Children: 1  . Years of Education: 16   Occupational History  .  [employer]    also has several group homes   Social History Main Topics  . Smoking status: Never Smoker   . Smokeless tobacco: Not on file  . Alcohol Use: No  . Drug Use: No  . Sexually Active: Yes -- Male partner(s)   Other Topics Concern  . Not on file   Social History Narrative   HSG, [employer]- BA business. Married '94. 1 daughter-'2002.  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        Review of Systems Review of Systems  Constitutional:  Negative for fever, chills,  activity change and unexpected weight change.  HENT:  Negative for hearing loss, ear pain, congestion, neck stiffness and postnasal drip.   Eyes: Negative for pain, discharge and visual disturbance.  Respiratory: Negative for chest tightness and wheezing.   Cardiovascular: Negative for chest pain and palpitations.       [No decreased exercise tolerance Gastrointestinal: [No change in bowel habit. No bloating or gas. No reflux or indigestion Genitourinary: Negative for urgency, frequency, flank pain and difficulty urinating.  Musculoskeletal: Negative for myalgias, back pain, arthralgias and gait problem.  Neurological: Negative for dizziness, tremors, weakness and headaches.  Hematological: Negative for adenopathy.  Psychiatric/Behavioral: Negative for behavioral problems and dysphoric mood.       Objective:   Physical Exam Constitutional: He is oriented to person, place, and time. He appears well-developed and well-nourished.       Healthy appearing AA male in no acute distress  HENT:  Head: Normocephalic and atraumatic.  Right Ear: External ear normal. EAC/TM nl. MIld amount of cerumen Left Ear: External ear normal.  EAC/TM nl. Mild amount of cerumen with obstructive of  Canal Nose: Nose normal.  Mouth/Throat: Oropharynx is clear and moist.  Eyes: Conjunctivae and EOM are normal. Pupils are equal, round, and reactive to light. Right  eye exhibits no discharge. Left eye exhibits no discharge. No scleral icterus.  Neck: Normal range of motion. Neck supple. No JVD present. No tracheal deviation present. No thyromegaly present.  Cardiovascular: Normal rate, regular rhythm and normal heart sounds.  Exam reveals no gallop and no friction rub.   No murmur heard.      Quiet precordium. 2+ radial and DP pulses  Pulmonary/Chest: Effort normal. No respiratory distress. He has no wheezes. He has no rales. He exhibits no tenderness.       No chest wall deformity  Abdominal: Soft. Bowel sounds are  normal. He exhibits no distension. There is no tenderness. There is no rebound and no guarding.       No heptosplenomegaly  Genitourinary: Rectal exam - Normal sphincter tone, prostate is mildly enlarged, 3+, with no nodules or other changes. Musculoskeletal: Normal range of motion. He exhibits no edema and no tenderness.       Small and large joints without redness, synovial thickening or deformity. Full range of motion preserved about all small, median and large joints.  Lymphadenopathy:    He has no cervical adenopathy.  Neurological: He is alert and oriented to person, place, and time. He has normal reflexes. No cranial nerve deficit. Coordination normal.  Skin: Skin is warm and dry. No rash noted. No erythema.  Psychiatric: He has a normal mood and affect. His behavior is normal. Thought content normal.    Lab Results  Component Value Date   WBC 4.9 09/15/2010   HGB 15.1 09/15/2010   HCT 44.1 09/15/2010   PLT 290.0 09/15/2010   CHOL 180 09/15/2010   TRIG 42.0 09/15/2010   HDL 46.20 09/15/2010   ALT 15 09/15/2010   AST 22 09/15/2010   NA 140 09/15/2010   K 4.4 09/15/2010   CL 106 09/15/2010   CREATININE 1.1 09/15/2010   BUN 16 09/15/2010   CO2 28 09/15/2010   TSH 1.96 09/15/2010   PSA 1.21 09/15/2010   Lab Results  Component Value Date   LDLCALC 125* 09/15/2010           Assessment & Plan:  1. Hypogonadism - reviewed Dr. Imelda Pillow correspondence - he had low normal range testosterone in the past.   Plan - trial of androderm patch 5 mg applied daily  2. Hypertension - suboptimal control. He reports that the reading in the office is similar to home readings.  Plan - continue bystolic           Change from benicar to Azor (benicar 20/amlodipine 5) once a day           Rx for BP cuff           Report back BP readings in 10 days - if controlled - will call in Rx.  3. Left shoulder injury/impairment - patient with persistent limitations in ROM.  Plan - per orthopedics  4.  Health maintenance - history indicates a benign course medically. Physical exam is normal. Lab results are in normal limits. Normal prostate exam and PSA. Immunizations are up to date. Mild cerumen accumulation is irrigated to clear.  In summary- a pleasant gentleman who is medically stable. He will return as needed or in 1 year.

## 2010-09-24 ENCOUNTER — Encounter: Payer: Self-pay | Admitting: Internal Medicine

## 2010-10-06 ENCOUNTER — Telehealth: Payer: Self-pay | Admitting: *Deleted

## 2010-10-06 NOTE — Telephone Encounter (Signed)
1. Pt req RX for Azor, was given samples by MD 2. Pt was given rx for Androderm Patch but would like rx for Testosterone cream.

## 2010-10-06 NOTE — Telephone Encounter (Signed)
1. Ok for azor 20/5 # 30 refill prn. Does he have some BP readings to share?  2. Topical testosterone gel: two products Androgel apply 50mg = = 4 pump actuations daily or or 1 5g pkt  Either 1 dispense or 30 5g pkts                                                                   testim apply 50 mg = 1 5g tube daily 30 5g tubes.

## 2010-10-07 MED ORDER — TESTOSTERONE 50 MG/5GM (1%) TD GEL: 5.0000 g | Freq: Every day | TRANSDERMAL | Status: DC

## 2010-10-07 MED ORDER — AMLODIPINE-OLMESARTAN 5-20 MG PO TABS: 1.0000 | ORAL_TABLET | Freq: Every day | ORAL | Status: DC

## 2010-10-07 NOTE — Telephone Encounter (Signed)
Spoke with patient who states that he has misplaced record of BP readings but he know that the "bottom number is lower". Per patient, he will start over and document his readings over again

## 2010-10-07 NOTE — Telephone Encounter (Signed)
Left mess to call office back.   

## 2010-10-09 NOTE — Op Note (Signed)
[facility]  Patient:    Corey Oliver, Corey Oliver                        MRN: [id number] Proc. Date: 12/10/99 Adm. Date:  [id number] Disc. Date: [id number] Attending:  Charlton Haws CC:         Ulyess Mort, M.D. [facility]                           Operative Report  CCS#:  [id number]  PREOPERATIVE DIAGNOSIS:  Chronic appendicitis.  POSTOPERATIVE DIAGNOSIS:  Chronic appendicitis.  OPERATION PERFORMED:  Laparoscopic appendectomy.  SURGEON:  Dr. Jamey Ripa.  ASSISTANT:  Dr. Earlene Plater.  ANESTHESIA:  General endotracheal.  CLINICAL HISTORY:  This patient is a 45 year old who has presented numerous times to the emergency room with one overnight observation for right lower abdominal pain, and on a recent evaluation was found to have what looked like a thickened appendix on CT scan. He was, however, at that point in time asymptomatic and elective laparoscopic appendectomy was scheduled for chronic recurrent appendicitis.  DESCRIPTION OF PROCEDURE:  The patient was brought to the operating room and after satisfactory general endotracheal anesthesia had been obtained, the abdomen was prepped and draped. I used 0.25% Marcaine at each incision site. An umbilical incision was made, the fascia opened. The Hasson was introduced and the abdomen insufflated to 15. The patient was placed in Trendelenburg position. A 5 mm trocar was placed in the right upper quadrant and an 11 in the left lower quadrant, both under direct vision.  The appendix was visualized and was thickened and looked to have some chronic inflammatory changes but not acute appendicitis. There are no other gross abnormalities in the pelvic area or the colon. The appendix was grasped and some adhesions taken down laterally. I had the appendix fairly well freed up and could see the base of the appendix. I made a small window in the mesentery and inserted the EndoGIA. The appendix was then much more freed up and  there was still a small bit of mesoappendix which I made a window through and clipped. The appendix was then divided with the EndoGIA at the base. It was brought out through the umbilical port. The umbilical port was replaced and we irrigated it to make sure everything was dry and that appeared intact.  The camera was placed in the umbilical port and we watched the left lower quadrant port come out and there was no bleeding. The right upper quadrant port was then removed and it was dried. The abdomen was deflated through the umbilical port and the pursestring tied down.  The skin incision was closed with 4-0 monocryl subcuticular Steri-Strips. Sterile dressings were applied. The patient tolerated the procedure well. There were no obvious complications. All counts were correct. DD:  12/10/99 TD:  12/11/99 Job: [id number]

## 2010-10-09 NOTE — Op Note (Signed)
Corey Oliver, Corey Oliver                           ACCOUNT NO.:  [id number]   MEDICAL RECORD NO.:  [id number]                   PATIENT TYPE:  AMB   LOCATION:  DAY                                  FACILITY:  [facility]   PHYSICIAN:  Currie Paris, M.D.           DATE OF BIRTH:  12/14/1965   DATE OF PROCEDURE:  07/11/2003  DATE OF DISCHARGE:                                 OPERATIVE REPORT   PREOPERATIVE DIAGNOSIS:  Lipoma, chin.   POSTOPERATIVE DIAGNOSIS:  Lipoma, chin.   OPERATION:  Excision lipoma of the chin (2 cm).   SURGEON:  Currie Paris, M.D.   ANESTHESIA:  MAC.   CLINICAL HISTORY:  This patient has a mass on the chin.  He had originally  seen Dr. Earlene Plater and scheduled to have that removed, but Dr. Earlene Plater was unable  to work his schedule, so the patient saw me for rescheduling and excision.   DESCRIPTION OF PROCEDURE:  The patient was seen in the holding area, had no  further questions.  He was taken to the operating room and given a little  bit of IV sedation.  The area was prepped with Betadine and anesthetized  with 0.5% Marcaine with epinephrine mixed equally with 1% plain Xylocaine.  A small incision was made over the mass which was on the tip of the chin.  The patient had already shaved his beard.  The mass was clearly a lipoma and  using some gentle hemostat dissection, I was able to free this up from the  surrounding tissues.  A few bleeding points were coagulated.   Once this was out, and it appeared to come out intact, I checked for  hemostasis and when everything was dry, closed in layers with 4-0 Vicryl  followed by 4-0 Monocryl subcuticular and Dermabond.   The patient tolerated the procedure well.                                               Currie Paris, M.D.    CJS/MEDQ  D:  07/11/2003  T:  07/11/2003  Job:  [id number]

## 2010-11-21 ENCOUNTER — Other Ambulatory Visit: Payer: Self-pay | Admitting: Internal Medicine

## 2010-11-23 NOTE — Telephone Encounter (Signed)
Please Advise refills 

## 2010-11-24 NOTE — Telephone Encounter (Signed)
Ok for refill prn 

## 2011-04-06 ENCOUNTER — Telehealth: Payer: Self-pay

## 2011-04-06 NOTE — Telephone Encounter (Signed)
Patient called to set up appt

## 2011-04-20 ENCOUNTER — Ambulatory Visit (INDEPENDENT_AMBULATORY_CARE_PROVIDER_SITE_OTHER): Payer: 59 | Admitting: Internal Medicine

## 2011-04-20 DIAGNOSIS — K921 Melena: Secondary | ICD-10-CM

## 2011-04-20 DIAGNOSIS — I10 Essential (primary) hypertension: Secondary | ICD-10-CM

## 2011-04-20 MED ORDER — HYDROCHLOROTHIAZIDE 25 MG PO TABS: 25.0000 mg | ORAL_TABLET | Freq: Every day | ORAL | Status: DC

## 2011-04-20 MED ORDER — HYDROCORTISONE ACETATE 25 MG RE SUPP: 25.0000 mg | Freq: Two times a day (BID) | RECTAL | Status: DC

## 2011-04-20 NOTE — Patient Instructions (Signed)
Bright red blood at times with bowel movement most likely hemrrhoidal. Plan - use anusol HC suppository twice a day for 6 days. If this doesn't 'clear up the problem will need to return for Anoscopy to look for the cause of bleeding.   Blood pressure - running a little high with systolic pressures in the 140's. Plan - continue Azor, Bystolic and add hydrochlorothiazide 25 mg daily.

## 2011-04-21 NOTE — Progress Notes (Signed)
  Subjective:    Patient ID: Corey Oliver, male    DOB: 1966-03-02, 45 y.o.   MRN: [id number]  HPI Mr.Ziomek presents to report that the is having a small amount of bright red blood on the tissue and in th ebowel associated with BM. This is intermittent, painless and similar to previous episodes related to hemorrhoids.  His blood pressure has been consistently elevated into the mid 140's to 150's on bystolic and azor. He has been asymptomatic.  He continues to have shoulder pain but has been able to return to work.   I have reviewed the patient's medical history in detail and updated the computerized patient record.    Review of Systems System review is negative for any constitutional, cardiac, pulmonary, GI or neuro symptoms or complaints other than as described in the HPI.     Objective:   Physical Exam Vital reveiwed - BP mildly elevated at 144/86 Gen'l - WNWD athletic appearing AA man in no distress HEENT C&S clear Pulm - normal respirations Cor 2+ radial pulse, RRR       Assessment & Plan:  Hematochezia - limited and most likely related to internal hemorrhoids  Plan - Anusol HC 25mg  supp           If he has persistent trouble will need anoscopy

## 2011-04-21 NOTE — Assessment & Plan Note (Signed)
BP Readings from Last 3 Encounters:  04/20/11 144/86  09/23/10 142/96  08/26/09 128/84   Plan- will continue Azor and bystolic at present dosing          Will add HCTZ 25mg  qd

## 2011-06-11 ENCOUNTER — Telehealth: Payer: Self-pay | Admitting: *Deleted

## 2011-06-11 DIAGNOSIS — K625 Hemorrhage of anus and rectum: Secondary | ICD-10-CM

## 2011-06-11 NOTE — Telephone Encounter (Signed)
Pt called stating this his BP has been around 100/70 & 100/60. Pt states that he feels great but would like your opinion if you think that is too low. Please advise.

## 2011-06-14 NOTE — Telephone Encounter (Signed)
BP's do seem a bit low and we had jsut aded HCTZ. Plan - stop HCTZ and monitor BP

## 2011-06-14 NOTE — Telephone Encounter (Signed)
Left msg to return call

## 2011-06-15 NOTE — Telephone Encounter (Signed)
Blood pressure control: resume HCTZ; stop Azor; start benicar 20 mg once a day (may have samples if we have them)  Please up-date med rec.  For continue rectal bleeding will need GI consult - order entered.

## 2011-06-15 NOTE — Telephone Encounter (Signed)
Pt. States he stoped his HCTZ & his BP was 138/90 & 140/92. Pt also states he used suppositories & is continuing to have rectal bleeding off & on. Please advise.

## 2011-06-16 ENCOUNTER — Other Ambulatory Visit: Payer: Self-pay | Admitting: *Deleted

## 2011-06-16 NOTE — Telephone Encounter (Signed)
Notified pt to resume HCTZ, stop Azor, & start benicar 20mg .  Updated meds & an order has been entered for a GI consult.

## 2011-07-05 ENCOUNTER — Ambulatory Visit (INDEPENDENT_AMBULATORY_CARE_PROVIDER_SITE_OTHER): Payer: 59 | Admitting: Gastroenterology

## 2011-07-05 ENCOUNTER — Encounter: Payer: Self-pay | Admitting: Gastroenterology

## 2011-07-05 ENCOUNTER — Ambulatory Visit: Payer: 59 | Admitting: Gastroenterology

## 2011-07-05 VITALS — BP 146/82 | HR 72 | Ht 66.0 in | Wt 212.0 lb

## 2011-07-05 DIAGNOSIS — K625 Hemorrhage of anus and rectum: Secondary | ICD-10-CM

## 2011-07-05 MED ORDER — PEG-KCL-NACL-NASULF-NA ASC-C 100 G PO SOLR: 1.0000 | ORAL | Status: DC

## 2011-07-05 NOTE — Progress Notes (Signed)
HPI: This is a  very pleasant 46 year old man whom I am meeting for the first time today.  He sees intermittent red rectal bleeding on TP and dripping into the toillette.  He first noticed it about 6 months ago.  He does not have to push or strain a lot.  Overall his weight is down a bit (intentionally).  No FH of Colon cancer.  His mother had ovarian cancer.  No real bowel changes lately.  Goes pretty regularly, about every day.  He has no anal discomfort.   Review of systems: Pertinent positive and negative review of systems were noted in the above HPI section. Complete review of systems was performed and was otherwise normal.    Past Medical History  Diagnosis Date  . Cystic acne   . URI (upper respiratory infection)   . Hypertension   . IBS (irritable bowel syndrome)   . GERD (gastroesophageal reflux disease)   . Hemorrhoids     Past Surgical History  Procedure Date  . Appendectomy   . Arthroscopic surgery left shoulder     for bone spurs jan '11    Current Outpatient Prescriptions  Medication Sig Dispense Refill  . amLODipine-olmesartan (AZOR) 5-20 MG per tablet Take 1 tablet by mouth daily.  90 tablet  3  . fluticasone (FLONASE) 50 MCG/ACT nasal spray 2 sprays by Nasal route daily.        . hydrochlorothiazide (HYDRODIURIL) 25 MG tablet Take 1 tablet (25 mg total) by mouth daily.  30 tablet  11  . HYDROcodone-acetaminophen (VICODIN) 5-500 MG per tablet Take 1 tablet by mouth every 6 (six) hours as needed.        [name] ibuprofen (ADVIL,MOTRIN) 800 MG tablet Take by mouth as needed.       . nebivolol (BYSTOLIC) 5 MG tablet Take 1 tablet (5 mg total) by mouth daily.  90 tablet  3  . oxyCODONE-acetaminophen (PERCOCET) 5-325 MG per tablet Take 1 tablet by mouth every 4 (four) hours as needed.        . rizatriptan (MAXALT) 10 MG tablet Take 1 tablet (10 mg total) by mouth as needed for migraine. May repeat in 2 hours if needed  10 tablet  5  . hydrocortisone (ANUSOL-HC) 25 MG  suppository Place 1 suppository (25 mg total) rectally every 12 (twelve) hours.  12 suppository  1    Allergies as of 07/05/2011  . (No Known Allergies)    Family History  Problem Relation Age of Onset  . Colon cancer Neg Hx     History   Social History  . Marital Status: Married    Spouse Name: N/A    Number of Children: 1  . Years of Education: 16   Occupational History  .  [employer]    also has several group homes   Social History Main Topics  . Smoking status: Never Smoker   . Smokeless tobacco: Never Used  . Alcohol Use: No  . Drug Use: No  . Sexually Active: Yes -- Male partner(s)   Other Topics Concern  . Not on file   Social History Narrative   HS[employer], [employer]- BA business. Married '94. 1 daughter-'2002.  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        Physical Exam: BP 146/82  Pulse 72  Ht 5\' 6"  (1.676 m)  Wt 212 lb (96.163 kg)  BMI 34.22 kg/m2 Constitutional: generally well-appearing Psychiatric: alert and oriented x3 Eyes:  extraocular movements intact Mouth: oral pharynx moist, no lesions Neck: supple no lymphadenopathy Cardiovascular: heart regular rate and rhythm Lungs: clear to auscultation bilaterally Abdomen: soft, nontender, nondistended, no obvious ascites, no peritoneal signs, normal bowel sounds Extremities: no lower extremity edema bilaterally Skin: no lesions on visible extremities Rectal examination deferred for upcoming colonoscopy   Assessment and plan: 46 y.o. male with  minor, intermittent rectal bleeding  We will proceed with colonoscopy at his soonest convenience. I suspect he has minor anorectal process such as hemorrhoids internal or external.  rectal exam was deferred for full colonoscopy upcoming.

## 2011-07-05 NOTE — Patient Instructions (Signed)
You will be set up for a colonoscopy. A copy of this information will be made available to Dr. Debby Bud.

## 2011-07-12 ENCOUNTER — Ambulatory Visit (AMBULATORY_SURGERY_CENTER): Payer: 59 | Admitting: Gastroenterology

## 2011-07-12 ENCOUNTER — Encounter: Payer: Self-pay | Admitting: Gastroenterology

## 2011-07-12 VITALS — BP 101/73 | HR 78 | Temp 97.5°F | Resp 16 | Ht 66.0 in | Wt 212.0 lb

## 2011-07-12 DIAGNOSIS — D126 Benign neoplasm of colon, unspecified: Secondary | ICD-10-CM

## 2011-07-12 DIAGNOSIS — K644 Residual hemorrhoidal skin tags: Secondary | ICD-10-CM

## 2011-07-12 DIAGNOSIS — K625 Hemorrhage of anus and rectum: Secondary | ICD-10-CM

## 2011-07-12 MED ORDER — SODIUM CHLORIDE 0.9 % IV SOLN: 500.0000 mL | INTRAVENOUS | Status: DC

## 2011-07-12 NOTE — Op Note (Signed)
[facility] [address] [place], [place]  [address]  COLONOSCOPY PROCEDURE REPORT  PATIENT:  Corey Oliver, Corey Oliver  MR#:  [id number] BIRTHDATE:  1965/09/12, 45 yrs. old  GENDER:  male ENDOSCOPIST:  Rachael Fee, MD REF. BY:  Rosalyn Gess. Norins, M.D. PROCEDURE DATE:  07/12/2011 PROCEDURE:  Colonoscopy [id number] ASA CLASS:  Class II INDICATIONS:  minor rectal bleeding MEDICATIONS:   Fentanyl 50 mcg IV, There was residual sedation effect present from prior procedure., Versed 6 mg IV  DESCRIPTION OF PROCEDURE:   After the risks benefits and alternatives of the procedure were thoroughly explained, informed consent was obtained.  Digital rectal exam was performed and revealed no rectal masses.   The LB 180AL [id number] endoscope was introduced through the anus and advanced to the cecum, which was identified by both the appendix and ileocecal valve, without limitations.  The quality of the prep was good..  The instrument was then slowly withdrawn as the colon was fully examined. <<PROCEDUREIMAGES>> FINDINGS:  A diminutive polyp was found in the descending colon. This was removed with cold snare and sent to pathology (jar 1) (see image3).  External Hemorrhoids were found.  This was otherwise a normal examination of the colon (see image1, image2, and image4).   Retroflexed views in the rectum revealed no abnormalities. COMPLICATIONS:  None  ENDOSCOPIC IMPRESSION: 1) Diminutive polyp in the descending colon; this was removed and sent to pathology 2) External hemorrhoids; these are likely the source of your minor rectal bleeding 3) Otherwise normal examination  RECOMMENDATIONS: 1) If the polyp(s) removed today are proven to be adenomatous (pre-cancerous) polyps, you will need a repeat colonoscopy in 5 years. Otherwise you should continue to follow colorectal cancer screening guidelines for "routine risk" patients with colonoscopy in 10 years. 2) You will receive a letter within 1-2  weeks with the results of your biopsy as well as final recommendations. Please call my office if you have not received a letter after 3 weeks. 3) Try OTC topical treatments such as Preparation H as needed for hemorrhoid bleeding.  ______________________________ Rachael Fee, MD  n. eSIGNED:   Rachael Fee at 07/12/2011 03:53 PM  Crosby Oyster, [phone]

## 2011-07-12 NOTE — Progress Notes (Signed)
The pt was very anxious about being put to sleep.  I explained what conscious sedation was and what he could expect.  It did seems to relax the pt some. Maw  The pt tolerated the colonoscopy very well. Maw

## 2011-07-12 NOTE — Patient Instructions (Addendum)
YOU HAD AN ENDOSCOPIC PROCEDURE TODAY AT THE [facility]: Refer to the procedure report that was given to you for any specific questions about what was found during the examination.  If the procedure report does not answer your questions, please call your gastroenterologist to clarify.  If you requested that your care partner not be given the details of your procedure findings, then the procedure report has been included in a sealed envelope for you to review at your convenience later.  YOU SHOULD EXPECT: Some feelings of bloating in the abdomen. Passage of more gas than usual.  Walking can help get rid of the air that was put into your GI tract during the procedure and reduce the bloating. If you had a lower endoscopy (such as a colonoscopy or flexible sigmoidoscopy) you may notice spotting of blood in your stool or on the toilet paper. If you underwent a bowel prep for your procedure, then you may not have a normal bowel movement for a few days.  DIET: Your first meal following the procedure should be a light meal and then it is ok to progress to your normal diet.  A half-sandwich or bowl of soup is an example of a good first meal.  Heavy or fried foods are harder to digest and may make you feel nauseous or bloated.  Likewise meals heavy in dairy and vegetables can cause extra gas to form and this can also increase the bloating.  Drink plenty of fluids but you should avoid alcoholic beverages for 24 hours.  ACTIVITY: Your care partner should take you home directly after the procedure.  You should plan to take it easy, moving slowly for the rest of the day.  You can resume normal activity the day after the procedure however you should NOT DRIVE or use heavy machinery for 24 hours (because of the sedation medicines used during the test).    SYMPTOMS TO REPORT IMMEDIATELY: A gastroenterologist can be reached at any hour.  During normal business hours, 8:30 AM to 5:00 PM Monday through Friday,  call [phone].  After hours and on weekends, please call the GI answering service at [phone] who will take a message and have the physician on call contact you.   Following lower endoscopy (colonoscopy or flexible sigmoidoscopy):  Excessive amounts of blood in the stool  Significant tenderness or worsening of abdominal pains  Swelling of the abdomen that is new, acute  Fever of 100F or higher    FOLLOW UP: If any biopsies were taken you will be contacted by phone or by letter within the next 1-3 weeks.  Call your gastroenterologist if you have not heard about the biopsies in 3 weeks.  Our staff will call the home number listed on your records the next business day following your procedure to check on you and address any questions or concerns that you may have at that time regarding the information given to you following your procedure. This is a courtesy call and so if there is no answer at the home number and we have not heard from you through the emergency physician on call, we will assume that you have returned to your regular daily activities without incident.  SIGNATURES/CONFIDENTIALITY: You and/or your care partner have signed paperwork which will be entered into your electronic medical record.  These signatures attest to the fact that that the information above on your After Visit Summary has been reviewed and is understood.  Full responsibility of the confidentiality   of this discharge information lies with you and/or your care-partner.     

## 2011-07-12 NOTE — Progress Notes (Signed)
Patient did not experience any of the following events: a burn prior to discharge; a fall within the facility; wrong site/side/patient/procedure/implant event; or a hospital transfer or hospital admission upon discharge from the facility. (G8907) Patient did not have preoperative order for IV antibiotic SSI prophylaxis. (G8918)  

## 2011-07-13 ENCOUNTER — Telehealth: Payer: Self-pay | Admitting: *Deleted

## 2011-07-13 NOTE — Telephone Encounter (Signed)
  Follow up Call-  Call back number 07/12/2011  Post procedure Call Back phone  # [phone]  Permission to leave phone message Yes     Patient questions:  Do you have a fever, pain , or abdominal swelling? no Pain Score  0 *  Have you tolerated food without any problems? yes  Have you been able to return to your normal activities? yes  Do you have any questions about your discharge instructions: Diet   no Medications  no Follow up visit  no  Do you have questions or concerns about your Care? no  Actions: * If pain score is 4 or above: No action needed, pain <4.

## 2011-07-20 ENCOUNTER — Encounter: Payer: Self-pay | Admitting: Gastroenterology

## 2011-10-28 ENCOUNTER — Telehealth: Payer: Self-pay

## 2011-10-28 NOTE — Telephone Encounter (Signed)
Pt called requesting Rx for Aciphex DR 20 mg 1 po qd to treat GERD. Pt says that he has tried his brother's tablets and it worked very well. Please advise, okay to send?

## 2011-10-28 NOTE — Telephone Encounter (Signed)
Ok for aciphex DR 20 mg once a day. May not get by insurance

## 2011-10-29 MED ORDER — RABEPRAZOLE SODIUM 20 MG PO TBEC: 20.0000 mg | DELAYED_RELEASE_TABLET | Freq: Every day | ORAL | Status: DC

## 2011-10-29 NOTE — Telephone Encounter (Signed)
Pt advised via secure VM 

## 2011-12-25 ENCOUNTER — Other Ambulatory Visit: Payer: Self-pay | Admitting: Internal Medicine

## 2012-01-20 ENCOUNTER — Telehealth: Payer: Self-pay

## 2012-01-20 MED ORDER — VARDENAFIL HCL 20 MG PO TABS: 20.0000 mg | ORAL_TABLET | Freq: Every day | ORAL | Status: DC | PRN

## 2012-01-20 NOTE — Telephone Encounter (Signed)
Rx changed, pt advised of same.

## 2012-01-20 NOTE — Telephone Encounter (Signed)
Pt called requesting to change from Cialis to Levitra. Pt is requesting Rx to [facility], please advise.

## 2012-01-20 NOTE — Telephone Encounter (Signed)
Ok for levitra 20 mg 1/2 or 1 prn #6 refill prn

## 2012-02-29 ENCOUNTER — Other Ambulatory Visit (INDEPENDENT_AMBULATORY_CARE_PROVIDER_SITE_OTHER): Payer: 59

## 2012-02-29 ENCOUNTER — Encounter: Payer: Self-pay | Admitting: Internal Medicine

## 2012-02-29 ENCOUNTER — Ambulatory Visit (INDEPENDENT_AMBULATORY_CARE_PROVIDER_SITE_OTHER): Payer: 59 | Admitting: Internal Medicine

## 2012-02-29 VITALS — BP 122/90 | HR 75 | Temp 97.9°F | Resp 16 | Wt 215.0 lb

## 2012-02-29 DIAGNOSIS — N529 Male erectile dysfunction, unspecified: Secondary | ICD-10-CM

## 2012-02-29 DIAGNOSIS — Z0000 Encounter for general adult medical examination without abnormal findings: Secondary | ICD-10-CM | POA: Insufficient documentation

## 2012-02-29 DIAGNOSIS — M25519 Pain in unspecified shoulder: Secondary | ICD-10-CM

## 2012-02-29 DIAGNOSIS — I10 Essential (primary) hypertension: Secondary | ICD-10-CM

## 2012-02-29 LAB — COMPREHENSIVE METABOLIC PANEL
AST: 18 U/L (ref 0–37)
BUN: 13 mg/dL (ref 6–23)
Calcium: 9.7 mg/dL (ref 8.4–10.5)
Chloride: 103 mEq/L (ref 96–112)
Creatinine, Ser: 1.3 mg/dL (ref 0.4–1.5)
GFR: 78.34 mL/min (ref >60.00)
Total Bilirubin: 0.9 mg/dL (ref 0.3–1.2)

## 2012-02-29 MED ORDER — HYDROCODONE-ACETAMINOPHEN 5-500 MG PO TABS: 1.0000 | ORAL_TABLET | Freq: Four times a day (QID) | ORAL | Status: DC | PRN

## 2012-02-29 MED ORDER — IBUPROFEN 800 MG PO TABS: 800.0000 mg | ORAL_TABLET | Freq: Four times a day (QID) | ORAL | Status: DC | PRN

## 2012-02-29 MED ORDER — OXYCODONE-ACETAMINOPHEN 5-325 MG PO TABS: 1.0000 | ORAL_TABLET | ORAL | Status: DC | PRN

## 2012-02-29 MED ORDER — HYDROCORTISONE ACETATE 25 MG RE SUPP: 25.0000 mg | Freq: Two times a day (BID) | RECTAL | Status: DC

## 2012-02-29 MED ORDER — RABEPRAZOLE SODIUM 20 MG PO TBEC: 20.0000 mg | DELAYED_RELEASE_TABLET | Freq: Every day | ORAL | Status: DC

## 2012-02-29 MED ORDER — FLUTICASONE PROPIONATE 50 MCG/ACT NA SUSP: 2.0000 | Freq: Every day | NASAL | Status: DC

## 2012-02-29 MED ORDER — HYDROCHLOROTHIAZIDE 25 MG PO TABS: 25.0000 mg | ORAL_TABLET | Freq: Every day | ORAL | Status: DC

## 2012-02-29 NOTE — Progress Notes (Signed)
Subjective:    Patient ID: Corey Oliver, male    DOB: 1966-03-02, 46 y.o.   MRN: [id number]  HPI Corey Oliver presents for an annual wellness exam. In the  Interval since his last visit he has been doing well. He has been released by his orthopedist and completed PT. He is back to work full time, but will still have occasional pain in the shoulder for which he takes either vicodin or percocet. He reports that he hasn't take much medication lately but does want refills this one time. He reports that his response to levitra is suboptimal. Otherwise he is doing well with no medical complaints.  Past Medical History  Diagnosis Date  . Cystic acne   . URI (upper respiratory infection)   . Hypertension   . IBS (irritable bowel syndrome)   . GERD (gastroesophageal reflux disease)   . Hemorrhoids    Past Surgical History  Procedure Date  . Appendectomy   . Arthroscopic surgery left shoulder     for bone spurs jan '11   Family History  Problem Relation Age of Onset  . Colon cancer Neg Hx   . Ovarian cancer Mother    History   Social History  . Marital Status: Married    Spouse Name: N/A    Number of Children: 1  . Years of Education: 16   Occupational History  .  [employer]    also has several group homes   Social History Main Topics  . Smoking status: Never Smoker   . Smokeless tobacco: Never Used  . Alcohol Use: No  . Drug Use: No  . Sexually Active: Yes -- Male partner(s)   Other Topics Concern  . Not on file   Social History Narrative   HSG, [employer]- BA business. Married '94. 1 daughter-'2002.  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     Current Outpatient Prescriptions on File Prior to Visit  Medication Sig Dispense Refill  . AZOR 5-20 MG per tablet TAKE 1 TABLET EVERY DAY  90 tablet  2  . BYSTOLIC 5 MG tablet TAKE 1 TABLET BY MOUTH EVERY DAY  90 tablet  2  . Phentolamine Mesylate POWD       . rizatriptan  (MAXALT) 10 MG tablet Take 1 tablet (10 mg total) by mouth as needed for migraine. May repeat in 2 hours if needed  10 tablet  5  . vardenafil (LEVITRA) 20 MG tablet Take 1 tablet (20 mg total) by mouth daily as needed for erectile dysfunction.  6 tablet  11  . DISCONTD: fluticasone (FLONASE) 50 MCG/ACT nasal spray 2 sprays by Nasal route daily.        [name] DISCONTD: hydrochlorothiazide (HYDRODIURIL) 25 MG tablet Take 1 tablet (25 mg total) by mouth daily.  30 tablet  11  . DISCONTD: RABEprazole (ACIPHEX) 20 MG tablet Take 1 tablet (20 mg total) by mouth daily.  30 tablet  5      Review of Systems Constitutional:  Negative for fever, chills, activity change and unexpected weight change.  HEENT:  Negative for hearing loss, ear pain, congestion, neck stiffness and postnasal drip. Negative for sore throat or swallowing problems. Negative for dental complaints.   Eyes: Negative for vision loss or change in visual acuity.  Respiratory: Negative for chest tightness and wheezing. Negative for DOE.   Cardiovascular: Negative for chest pain or palpitations. No decreased exercise tolerance Gastrointestinal: No change in bowel habit.  No bloating or gas. No reflux or indigestion Genitourinary: Negative for urgency, frequency, flank pain and difficulty urinating.  Musculoskeletal: Negative for myalgias, back pain, arthralgias and gait problem.  Neurological: Negative for dizziness, tremors, weakness and headaches.  Hematological: Negative for adenopathy.  Psychiatric/Behavioral: Negative for behavioral problems and dysphoric mood.       Objective:   Physical Exam Filed Vitals:   02/29/12 1347  BP: 122/90  Pulse: 75  Temp: 97.9 F (36.6 C)  Resp: 16   Wt Readings from Last 3 Encounters:  02/29/12 215 lb (97.523 kg)  07/12/11 212 lb (96.163 kg)  07/05/11 212 lb (96.163 kg)   Gen'l: Well nourished well developed AA male in no acute distress  HEENT: Head: Normocephalic and atraumatic. Right Ear:  External ear normal. EAC-w/ cerumen/TM nl. Left Ear: External ear normal.  EAC-with cerumen impaction/TM not visualized. Nose: Nose normal. Mouth/Throat: Oropharynx is clear and moist. Dentition - native, in good repair except premolar right mandible where cap has come off. No buccal or palatal lesions. Posterior pharynx clear. Eyes: Conjunctivae and sclera clear. EOM intact. Pupils are equal, round, and reactive to light. Right eye exhibits no discharge. Left eye exhibits no discharge. Neck: Normal range of motion. Neck supple. No JVD present. No tracheal deviation present. No thyromegaly present.  Cardiovascular: Normal rate, regular rhythm, no gallop, no friction rub, no murmur heard.      Quiet precordium. 2+ radial and DP pulses . No carotid bruits Pulmonary/Chest: Effort normal. No respiratory distress or increased WOB, no wheezes, no rales. No chest wall deformity or CVAT. Abdomen: Soft. Bowel sounds are normal in all quadrants. He exhibits no distension, no tenderness, no rebound or guarding, No heptosplenomegaly  Genitourinary:  deferred Musculoskeletal: Normal range of motion. He exhibits no edema and no tenderness.       Small and large joints without redness, synovial thickening or deformity.  Lymphadenopathy:    He has no cervical or supraclavicular adenopathy.  Neurological: He is alert and oriented to person, place, and time. CN II-XII intact. DTRs 2+ and symmetrical biceps, radial and patellar tendons. Cerebellar function normal with no tremor, rigidity, normal gait and station.  Skin: Skin is warm and dry. No rash noted. No erythema.  Psychiatric: He has a normal mood and affect. His behavior is normal. Thought content normal.         Assessment & Plan:

## 2012-02-29 NOTE — Assessment & Plan Note (Signed)
Patient has completed recovery from surgery and PT. He still has occasional pain. Discussed use of narcotics for pain and the potential for addiction. He reports that he has infrequent use.  Plan  RX vicodin #120, percocet #180 with no plan for additional refills  May resume training with caution

## 2012-02-29 NOTE — Patient Instructions (Addendum)
Good exam. Check on tetanus shot status. continue all your present medications.,

## 2012-02-29 NOTE — Assessment & Plan Note (Signed)
Provided free trial card for viagra 100 mg #3

## 2012-02-29 NOTE — Assessment & Plan Note (Signed)
Interval history w/o new problems and good recovery re: left shoulder. Physical exam sans prostate is normal. Labs pending - Bmet. Lipid panel, LFTs, PSA, CBC '12 were reviewed and are normal. He is current w/ colorectal cancer screening and he is current per ACU 4/'13 guidelines.  Moderate cerumen impaction bilaterally: irrigated ears w/o difficulty and good results. He is advised to not use Q-tips to clean his ears.   In summary - a very nice guy who is medically stable. He will return in 1 year or prn.

## 2012-02-29 NOTE — Assessment & Plan Note (Signed)
BP Readings from Last 3 Encounters:  02/29/12 122/90  07/12/11 101/73  07/05/11 146/82   Good control on present medication. For Bmet today.

## 2012-03-05 ENCOUNTER — Encounter: Payer: Self-pay | Admitting: Internal Medicine

## 2012-05-23 DIAGNOSIS — J111 Influenza due to unidentified influenza virus with other respiratory manifestations: Secondary | ICD-10-CM

## 2012-05-23 HISTORY — DX: Influenza due to unidentified influenza virus with other respiratory manifestations: J11.1

## 2012-06-22 ENCOUNTER — Ambulatory Visit (INDEPENDENT_AMBULATORY_CARE_PROVIDER_SITE_OTHER): Payer: 59 | Admitting: Internal Medicine

## 2012-06-22 ENCOUNTER — Encounter: Payer: Self-pay | Admitting: Internal Medicine

## 2012-06-22 VITALS — BP 138/90 | HR 92 | Temp 98.1°F | Resp 10 | Wt 217.0 lb

## 2012-06-22 DIAGNOSIS — I10 Essential (primary) hypertension: Secondary | ICD-10-CM

## 2012-06-22 DIAGNOSIS — N529 Male erectile dysfunction, unspecified: Secondary | ICD-10-CM

## 2012-06-22 MED ORDER — RABEPRAZOLE SODIUM 20 MG PO TBEC: 20.0000 mg | DELAYED_RELEASE_TABLET | Freq: Every day | ORAL | Status: DC

## 2012-06-22 NOTE — Patient Instructions (Addendum)
Blood pressure medication related impotence: Plan Leave off the Azor Leave off the Hydrochlorothiazide If the problem is not improved in 7-10 days stop the Bystolic. If you still have the problem off all blood pressure medications than it is a problem for Dr. Patsi Sears.  Blood pressure control: plan Once we know which, if any drug, is causing impotence we will add back blood pressure medications one at a time.  First, will continue or restart Bystolic. Second, if not controlled, will restart Benicar. Will add Tekturna next if not controlled - it is best used with drugs like benicar.  At any point as we add back medications we will watch out for impotence.  You need to sign up for MyChart so you can communicated to me using the E-message function.  Call for an appointment with Dr. Patsi Sears in regard to testosterone deficiency.

## 2012-06-24 NOTE — Assessment & Plan Note (Addendum)
Adequate control but patient c/o impotence and questions whether it is medication related.  Plan Leave off the Azor Leave off the Hydrochlorothiazide If the problem is not improved in 7-10 days stop the Bystolic. If you still have the problem off all blood pressure medications than it is a problem for Dr. Patsi Sears.  Blood pressure control: plan Once we know which, if any drug, is causing impotence we will add back blood pressure medications one at a time.  First, will continue or restart Bystolic. Second, if not controlled, will restart Benicar. Will add Tekturna next if not controlled - it is best used with drugs like benicar.

## 2012-06-24 NOTE — Progress Notes (Signed)
  Subjective:    Patient ID: Corey Oliver, male    DOB: Oct 16, 1965, 47 y.o.   MRN: [id number]  HPI Corey Oliver presents for follow -up of blood pressure: currentlyon ARB, CCB, Diuretc and BB. His blood pressure has been good but he c/o worsening impotence - unable to attain/sustain an erection. He odes have a history of low testosterone as tested by Dr. Page Spiro but he feels this is a medication problem. He is otherwise feeling OK.  PMH, FamHx and SocHx reviewed for any changes and relevance. Current Outpatient Prescriptions on File Prior to Visit  Medication Sig Dispense Refill  . AZOR 5-20 MG per tablet TAKE 1 TABLET EVERY DAY  90 tablet  2  . BYSTOLIC 5 MG tablet TAKE 1 TABLET BY MOUTH EVERY DAY  90 tablet  2  . hydrochlorothiazide (HYDRODIURIL) 25 MG tablet Take 1 tablet (25 mg total) by mouth daily.  90 tablet  3  . HYDROcodone-acetaminophen (VICODIN) 5-500 MG per tablet Take 1 tablet by mouth every 6 (six) hours as needed.  120 tablet  0  . hydrocortisone (ANUSOL-HC) 25 MG suppository Place 1 suppository (25 mg total) rectally every 12 (twelve) hours.  12 suppository  3  . ibuprofen (ADVIL,MOTRIN) 800 MG tablet Take 1 tablet (800 mg total) by mouth every 6 (six) hours as needed.  120 tablet  0  . oxyCODONE-acetaminophen (PERCOCET/ROXICET) 5-325 MG per tablet Take 1 tablet by mouth every 4 (four) hours as needed.  180 tablet  0  . Phentolamine Mesylate POWD       . RABEprazole (ACIPHEX) 20 MG tablet Take 1 tablet (20 mg total) by mouth daily.  90 tablet  3  . rizatriptan (MAXALT) 10 MG tablet Take 1 tablet (10 mg total) by mouth as needed for migraine. May repeat in 2 hours if needed  10 tablet  5  . vardenafil (LEVITRA) 20 MG tablet Take 1 tablet (20 mg total) by mouth daily as needed for erectile dysfunction.  6 tablet  11  . fluticasone (FLONASE) 50 MCG/ACT nasal spray Place 2 sprays into the nose daily.  16 g  10      Review of Systems System review is negative for any  constitutional, cardiac, pulmonary, GI or neuro symptoms or complaints other than as described in the HPI.     Objective:   Physical Exam Filed Vitals:   06/22/12 1510  BP: 138/90  Pulse: 92  Temp: 98.1 F (36.7 C)  Resp: 10   Gen';l- WNWD AA man in no distress HEENT- C&S clear Cor- RRR Pulm - normal respoirations. Neuro - A&O x 3, non-focal exam.       Assessment & Plan:

## 2012-06-24 NOTE — Assessment & Plan Note (Signed)
Currently not taking testosterone replacement. He has adequate libido.  Plan Eliminate anti-hypertensive drugs as cause of impotence and if not meds will need to consult with Dr. Patsi Sears about options.

## 2012-08-01 ENCOUNTER — Other Ambulatory Visit: Payer: Self-pay | Admitting: Internal Medicine

## 2013-01-06 ENCOUNTER — Other Ambulatory Visit: Payer: Self-pay | Admitting: Internal Medicine

## 2013-01-08 ENCOUNTER — Encounter: Payer: Self-pay | Admitting: Internal Medicine

## 2013-01-08 ENCOUNTER — Ambulatory Visit (INDEPENDENT_AMBULATORY_CARE_PROVIDER_SITE_OTHER): Payer: 59 | Admitting: Internal Medicine

## 2013-01-08 VITALS — BP 138/100 | HR 83 | Temp 98.6°F | Wt 215.4 lb

## 2013-01-08 DIAGNOSIS — N529 Male erectile dysfunction, unspecified: Secondary | ICD-10-CM

## 2013-01-08 DIAGNOSIS — I10 Essential (primary) hypertension: Secondary | ICD-10-CM

## 2013-01-08 MED ORDER — LISINOPRIL-HYDROCHLOROTHIAZIDE 10-12.5 MG PO TABS: 1.0000 | ORAL_TABLET | Freq: Every day | ORAL | Status: DC

## 2013-01-08 MED ORDER — SILDENAFIL CITRATE 100 MG PO TABS: 50.0000 mg | ORAL_TABLET | Freq: Every day | ORAL | Status: DC | PRN

## 2013-01-08 MED ORDER — NEBIVOLOL HCL 5 MG PO TABS: ORAL_TABLET | ORAL | Status: DC

## 2013-01-08 MED ORDER — RABEPRAZOLE SODIUM 20 MG PO TBEC: 20.0000 mg | DELAYED_RELEASE_TABLET | Freq: Every day | ORAL | Status: DC

## 2013-01-08 MED ORDER — FLUTICASONE PROPIONATE 50 MCG/ACT NA SUSP: 2.0000 | Freq: Every day | NASAL | Status: DC

## 2013-01-08 NOTE — Progress Notes (Signed)
Subjective:    Patient ID: Corey Oliver, male    DOB: [birth year], 47 y.o.   MRN: [id number]  HPI Corey Oliver presents for further evaluation and treatment of HTN. He has been taking bystolic 5 mg as a single agent. BP has been in 150's/100+. He has had headache, malaise, felt bad. He was previously on Azor 5/20 but stopped for potential ED.   Past Medical History  Diagnosis Date  . Cystic acne   . URI (upper respiratory infection)   . Hypertension   . IBS (irritable bowel syndrome)   . GERD (gastroesophageal reflux disease)   . Hemorrhoids   . Influenza 05/23/12   Past Surgical History  Procedure Laterality Date  . Appendectomy    . Arthroscopic surgery left shoulder      for bone spurs jan '11   Family History  Problem Relation Age of Onset  . Colon cancer Neg Hx   . Ovarian cancer Mother    History   Social History  . Marital Status: Married    Spouse Name: N/A    Number of Children: 1  . Years of Education: 16   Occupational History  .  [employer]    also has several group homes   Social History Main Topics  . Smoking status: Never Smoker   . Smokeless tobacco: Never Used  . Alcohol Use: No  . Drug Use: No  . Sexual Activity: Yes    Partners: Female   Other Topics Concern  . Not on file   Social History Narrative   HS[employer], [employer]- BA business. Married '94. 1 daughter-'2002.  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     Current Outpatient Prescriptions on File Prior to Visit  Medication Sig Dispense Refill  . ACIPHEX 20 MG tablet TAKE 1 TABLET EVERY DAY  30 tablet  5  . AZOR 5-20 MG per tablet TAKE 1 TABLET EVERY DAY  90 tablet  2  . BYSTOLIC 5 MG tablet TAKE 1 TABLET BY MOUTH EVERY DAY  90 tablet  1  . fluticasone (FLONASE) 50 MCG/ACT nasal spray Place 2 sprays into the nose daily.  16 g  10  . hydrochlorothiazide (HYDRODIURIL) 25 MG tablet Take 1 tablet (25 mg total) by mouth daily.  90 tablet  3  .  HYDROcodone-acetaminophen (VICODIN) 5-500 MG per tablet Take 1 tablet by mouth every 6 (six) hours as needed.  120 tablet  0  . hydrocortisone (ANUSOL-HC) 25 MG suppository Place 1 suppository (25 mg total) rectally every 12 (twelve) hours.  12 suppository  3  . ibuprofen (ADVIL,MOTRIN) 800 MG tablet Take 1 tablet (800 mg total) by mouth every 6 (six) hours as needed.  120 tablet  0  . oxyCODONE-acetaminophen (PERCOCET/ROXICET) 5-325 MG per tablet Take 1 tablet by mouth every 4 (four) hours as needed.  180 tablet  0  . Phentolamine Mesylate POWD       . RABEprazole (ACIPHEX) 20 MG tablet Take 1 tablet (20 mg total) by mouth daily.  90 tablet  3  . rizatriptan (MAXALT) 10 MG tablet Take 1 tablet (10 mg total) by mouth as needed for migraine. May repeat in 2 hours if needed  10 tablet  5  . vardenafil (LEVITRA) 20 MG tablet Take 1 tablet (20 mg total) by mouth daily as needed for erectile dysfunction.  6 tablet  11   No current facility-administered medications on file prior to visit.  Review of Systems System review is negative for any constitutional, cardiac, pulmonary, GI or neuro symptoms or complaints other than as described in the HPI.     Objective:   Physical Exam Filed Vitals:   01/08/13 1611  BP: 138/100  Pulse: 83  Temp: 98.6 F (37 C)   BP Readings from Last 3 Encounters:  01/08/13 138/100  06/22/12 138/90  02/29/12 122/90   Gen'l- WNWD, muscular AA man in no distress HEENT_ C&S clear, PERRLA Cor 2+ radial, RRR Pulm - normal respirations Neuro - non focal       Assessment & Plan:

## 2013-01-08 NOTE — Patient Instructions (Addendum)
Blood pressure is too high and it sounds like you are symptomatic, e.g. Headache, feeling bad.  Plan Continue bystolic 5 mg daily  Start lisinopril/Hct 10/12.5 once a day.   BP check   Check on the ingredients in Anti-catabolic [id number]

## 2013-01-09 ENCOUNTER — Telehealth: Payer: Self-pay

## 2013-01-09 NOTE — Telephone Encounter (Signed)
Request send to medical records for paper chart from storage, specifically looking for sleep study per Dr Debby Bud

## 2013-01-09 NOTE — Assessment & Plan Note (Signed)
Rx for viagra provided

## 2013-01-09 NOTE — Assessment & Plan Note (Signed)
BP is not well controlled on bystolic alone: BP Readings from Last 3 Encounters:  01/08/13 138/100  06/22/12 138/90  02/29/12 122/90   He reports higher readings at other times.  Plan Continue bystolic  Add lisinopril/hct 10/12.5 daily  Obtain a BP cuff for use at home.

## 2013-01-11 ENCOUNTER — Telehealth: Payer: Self-pay | Admitting: *Deleted

## 2013-01-11 NOTE — Telephone Encounter (Signed)
Pt called states he discontinued the Bystolic continued the lisinopril/HCTZ.  Further states he feels fine with only the lisinopril/HCTZ.  Requesting whether he needs to add a Beta Blocker.  Please advise

## 2013-01-12 NOTE — Telephone Encounter (Signed)
Cessation of bystolic noted. OK to continue lisinopril/hct alone. Check BP and report back. If controlled will not need BB.

## 2013-01-15 ENCOUNTER — Telehealth: Payer: Self-pay | Admitting: *Deleted

## 2013-01-15 NOTE — Telephone Encounter (Signed)
Spoke with pt advised of MDs message 

## 2013-01-15 NOTE — Telephone Encounter (Signed)
Unable to contact pt with MDs message.  Can not leave VM.

## 2013-01-25 ENCOUNTER — Telehealth: Payer: Self-pay | Admitting: Internal Medicine

## 2013-01-25 NOTE — Telephone Encounter (Signed)
OV to discuss meds

## 2013-01-25 NOTE — Telephone Encounter (Signed)
Patient Information:  Caller Name: [place]  Phone: [phone]  Patient: Corey Oliver, Corey Oliver  Gender: Male  DOB: 1965/06/24  Age: 47 Years  PCP: Illene Regulus (Adults only)  Office Follow Up:  Does the office need to follow up with this patient?: Yes  Instructions For The Office: MD instructions regarding BP/meds and if will be referred to "hypertension specialist."  RN Note:  Taking both Bistolic and Lisinopril/HCT. Concerned about possible mild side effect of medications including occasional headache and "feels down" at times.  Symptoms  Reason For Call & Symptoms: Called to report BP readings for past week in left arm;  01/17/13: 140/95, 8/28: 143/90, 8/29: 140/92, 8/30: 140/89,  8/31:143/90, 9/1:150/90, 9/2: 148/93, 9/3: 140/89 and 01/25/13 127/86. Asking for referral to hypertension specialist.  Reviewed Health History In EMR: Yes  Reviewed Medications In EMR: Yes  Reviewed Allergies In EMR: Yes  Reviewed Surgeries / Procedures: Yes  Date of Onset of Symptoms: 01/08/2013  Treatments Tried: Resumed Bistolic 01/15/13 after discussing with CMA.  Treatments Tried Worked: Yes  Guideline(s) Used:  High Blood Pressure  Disposition Per Guideline:   Discuss with PCP and Callback by Nurse Today  Reason For Disposition Reached:   Taking BP medications and feels is having side effects (e.g., impotence, cough, dizziness)  Advice Given:  N/A  Patient Will Follow Care Advice:  YES

## 2013-01-25 NOTE — Telephone Encounter (Signed)
Pt transferred to scheduler for OV

## 2013-01-31 ENCOUNTER — Ambulatory Visit (INDEPENDENT_AMBULATORY_CARE_PROVIDER_SITE_OTHER): Payer: 59 | Admitting: Internal Medicine

## 2013-01-31 ENCOUNTER — Encounter: Payer: Self-pay | Admitting: Internal Medicine

## 2013-01-31 VITALS — BP 120/96 | HR 70 | Temp 98.4°F | Wt 214.8 lb

## 2013-01-31 DIAGNOSIS — I10 Essential (primary) hypertension: Secondary | ICD-10-CM

## 2013-01-31 MED ORDER — TRIAMCINOLONE ACETONIDE 0.1 % EX CREA: TOPICAL_CREAM | Freq: Two times a day (BID) | CUTANEOUS | Status: DC

## 2013-01-31 NOTE — Patient Instructions (Addendum)
Blood pressure and ED  Plan Continue the lisinopril/HCT  Stop Bystolic   If ED is resolved we can than start a different product  If the ED is no better, resume bystolic and stop the lisinopril/HCT  If the ED is better off lisinopril/HCT - will start a different. 

## 2013-02-02 NOTE — Progress Notes (Signed)
  Subjective:    Patient ID: Corey Oliver, male    DOB: 09-09-1965, 47 y.o.   MRN: [id number]  HPI Presents for follow up on Blood pressure management. He is tolerating current medications but does report that he has ED. BP has been better controlled.  PMH, FamHx and SocHx reviewed for any changes and relevance.  Current Outpatient Prescriptions on File Prior to Visit  Medication Sig Dispense Refill  . ACIPHEX 20 MG tablet TAKE 1 TABLET EVERY DAY  30 tablet  5  . fluticasone (FLONASE) 50 MCG/ACT nasal spray Place 2 sprays into the nose daily.  16 g  10  . HYDROcodone-acetaminophen (VICODIN) 5-500 MG per tablet Take 1 tablet by mouth every 6 (six) hours as needed.  120 tablet  0  . hydrocortisone (ANUSOL-HC) 25 MG suppository Place 1 suppository (25 mg total) rectally every 12 (twelve) hours.  12 suppository  3  . ibuprofen (ADVIL,MOTRIN) 800 MG tablet Take 1 tablet (800 mg total) by mouth every 6 (six) hours as needed.  120 tablet  0  . lisinopril-hydrochlorothiazide (PRINZIDE,ZESTORETIC) 10-12.5 MG per tablet Take 1 tablet by mouth daily.  30 tablet  5  . nebivolol (BYSTOLIC) 5 MG tablet TAKE 1 TABLET BY MOUTH EVERY DAY  90 tablet  1  . oxyCODONE-acetaminophen (PERCOCET/ROXICET) 5-325 MG per tablet Take 1 tablet by mouth every 4 (four) hours as needed.  180 tablet  0  . Phentolamine Mesylate POWD       . RABEprazole (ACIPHEX) 20 MG tablet Take 1 tablet (20 mg total) by mouth daily.  90 tablet  3  . rizatriptan (MAXALT) 10 MG tablet Take 1 tablet (10 mg total) by mouth as needed for migraine. May repeat in 2 hours if needed  10 tablet  5  . sildenafil (VIAGRA) 100 MG tablet Take 0.5-1 tablets (50-100 mg total) by mouth daily as needed for erectile dysfunction.  5 tablet  11  . vardenafil (LEVITRA) 20 MG tablet Take 1 tablet (20 mg total) by mouth daily as needed for erectile dysfunction.  6 tablet  11  . hydrochlorothiazide (HYDRODIURIL) 25 MG tablet Take 1 tablet (25 mg total) by mouth daily.   90 tablet  3   No current facility-administered medications on file prior to visit.      Review of Systems System review is negative for any constitutional, cardiac, pulmonary, GI or neuro symptoms or complaints other than as described in the HPI.     Objective:   Physical Exam Filed Vitals:   01/31/13 1309  BP: 120/96  Pulse: 70  Temp: 98.4 F (36.9 C)   BP Readings from Last 3 Encounters:  01/31/13 120/96  01/08/13 138/100  06/22/12 138/90   Gen'l Athletic AA man in no distress Cor - 2+ radial pulse, regular Pulm - normal respirations. Neuro - A&O, normal gait.        Assessment & Plan:

## 2013-02-02 NOTE — Assessment & Plan Note (Signed)
Blood pressure and ED  Plan Continue the lisinopril/HCT  Stop Bystolic   If ED is resolved we can than start a different product  If the ED is no better, resume bystolic and stop the lisinopril/HCT  If the ED is better off lisinopril/HCT - will start a different.

## 2013-03-15 ENCOUNTER — Encounter: Payer: Self-pay | Admitting: Internal Medicine

## 2013-03-15 ENCOUNTER — Ambulatory Visit (INDEPENDENT_AMBULATORY_CARE_PROVIDER_SITE_OTHER): Payer: 59 | Admitting: Internal Medicine

## 2013-03-15 VITALS — BP 148/100 | HR 84 | Temp 97.8°F | Wt 217.0 lb

## 2013-03-15 DIAGNOSIS — I10 Essential (primary) hypertension: Secondary | ICD-10-CM

## 2013-03-15 MED ORDER — FLUTICASONE PROPIONATE 50 MCG/ACT NA SUSP: 2.0000 | Freq: Every day | NASAL | Status: AC

## 2013-03-15 MED ORDER — METRONIDAZOLE 500 MG PO TABS: 2000.0000 mg | ORAL_TABLET | Freq: Once | ORAL | Status: DC

## 2013-03-15 NOTE — Patient Instructions (Signed)
1. trichomonas - a flagellate organism that can be transmitted sexually. If a woman is diagnosed we automatically treat the partner.  Plan Flagyl 500 mg take 4 tablets together once and done  2. Blood pressure - I have not had good success getting this down.  Plan Renal artery ultrasound to rule out renal artery stenosis  Referral to Dr. Elvis Coil, a kidney specialist. Kidney doctors are THE expert on blood pressure control.  3. Allergic rhinitis - the runny nose and allergy symptoms  Plan flonase 1 spray to each nostril twice a day for 7 days, then once a day.    Trichomoniasis Trichomoniasis is an infection, caused by the Trichomonas organism, that affects both women and men. In women, the outer male genitalia and the vagina are affected. In men, the penis is mainly affected, but the prostate and other reproductive organs can also be involved. Trichomoniasis is a sexually transmitted disease (STD) and is most often passed to another person through sexual contact. The majority of people who get trichomoniasis do so from a sexual encounter and are also at risk for other STDs. CAUSES   Sexual intercourse with an infected partner.  It can be present in swimming pools or hot tubs. SYMPTOMS   Abnormal gray-green frothy vaginal discharge in women.  Vaginal itching and irritation in women.  Itching and irritation of the area outside the vagina in women.  Penile discharge with or without pain in males.  Inflammation of the urethra (urethritis), causing painful urination.  Bleeding after sexual intercourse. RELATED COMPLICATIONS  Pelvic inflammatory disease.  Infection of the uterus (endometritis).  Infertility.  Tubal (ectopic) pregnancy.  It can be associated with other STDs, including gonorrhea and chlamydia, hepatitis B, and HIV. COMPLICATIONS DURING PREGNANCY  Early (premature) delivery.  Premature rupture of the membranes (PROM).  Low birth weight. DIAGNOSIS    Visualization of Trichomonas under the microscope from the vagina discharge.  Ph of the vagina greater than 4.5, tested with a test tape.  Trich Rapid Test.  Culture of the organism, but this is not usually needed.  It may be found on a Pap test.  Having a "strawberry cervix,"which means the cervix looks very red like a strawberry. TREATMENT   You may be given medication to fight the infection. Inform your caregiver if you could be or are pregnant. Some medications used to treat the infection should not be taken during pregnancy.  Over-the-counter medications or creams to decrease itching or irritation may be recommended.  Your sexual partner will need to be treated if infected. HOME CARE INSTRUCTIONS   Take all medication prescribed by your caregiver.  Take over-the-counter medication for itching or irritation as directed by your caregiver.  Do not have sexual intercourse while you have the infection.  Do not douche or wear tampons.  Discuss your infection with your partner, as your partner may have acquired the infection from you. Or, your partner may have been the person who transmitted the infection to you.  Have your sex partner examined and treated if necessary.  Practice safe, informed, and protected sex.  See your caregiver for other STD testing. SEEK MEDICAL CARE IF:   You still have symptoms after you finish the medication.  You have an oral temperature above 102 F (38.9 C).  You develop belly (abdominal) pain.  You have pain when you urinate.  You have bleeding after sexual intercourse.  You develop a rash.  The medication makes you sick or makes you throw  up (vomit). Document Released: 11/03/2000 Document Revised: 08/02/2011 Document Reviewed: 11/29/2008 [facility] Patient Information 2014 [place], [place].    Renal Renal Artery Stenosis Renal artery stenosis (RAS) is the narrowing of the artery that supplies blood to the kidney. If the narrowing  is critical and the kidney does not get enough blood, hypertension (high blood pressure) can develop. This is called renal vascular hypertension (RVH). This is a common, uncommon cause of secondary hypertension. It does not usually happen until there is at least a 70% narrowing of the artery. Decreased blood flow through the renal artery causes the kidney to release increased amounts of a hormone. It is called renin. Renin is a strong blood pressure regulator. When it is high, it causes changes that lead to hypertension. Eventually the kidney not receiving enough blood may shrink in size and become less useful. The high blood pressure that is produced can eventually damage and destroy the remaining kidney. This is called hypertensive nephrosclerosis. If both kidneys fail, it will lead to chronic renal failure.  CAUSES  Most renal artery stenosis is caused by a hardening of the arteries (atherosclerosis). This is called Atherosclerotic Renal Artery Stenosis (AS-RAS). It is caused by a build-up of cholesterol (plaques) on the inner lining of the renal artery. A much less common cause is Fibromuscular Dysplasia (FMD). With it, there is an abnormality in the muscular lining of the renal artery. FMD-RAS occurs almost exclusively in women aged 45 to 89. It rarely affects African Americans or Asians.  SYMPTOMS  Often high blood pressure is discovered on a routine blood pressure check. It may be the only sign that something is wrong. Other problems that may occur are:  You may develop calf pain when walking. This is called intermittent claudication. It may be a sign of bad circulation in the legs.  Inability to use certain blood pressure pills such as angiotensin-I (ACE-I) inhibitors or angiotensin receptor blockers (ARB's). These could cause sudden drops in blood pressure with worsening of kidney function.  More than three antihypertensive medications may be needed for blood pressure control.  New onset of  high blood pressure if you are over 55. DIAGNOSIS  Your caregiver may find suggestions of this on exam if he finds bruits (like murmurs) on listening to your abdomen (belly) or the large arteries in your neck. Your caregiver may also suspect this there is a sudden worsening of your blood pressure when it has been well controlled and you are over age 49. Additional testing that may be done includes:  One diagnostic method used for renal artery stenosis (RAS) is to measure and compare the level of renin, (blood pressure-regulating hormone released by the kidneys), in the right to the left renal veins. If the amount of renin released by one-side is markedly higher than the other, this identifies a high renin-releasing kidney consistent with RAS.  FMD-RAS is often found on renal scan with ACE-inhibitor challenge, or ultrasound with Doppler.  FMD responds well to angioplasty and stenting. The results of stenting in FMD are usually long lasting. RISK FACTORS  Most renal artery stenosis is caused by a hardening of the arteries. This is called atherosclerosis. Other risk factors associated with the development of atherosclerotic RAS include the following:   Carotid artery disease.  Obesity.  High blood pressure.  Heredity.  Old age.  Fibromuscular dysplasia.  Diabetes mellitus.  Smoking.  Hardening of the arteries. TREATMENT   Renal vascular hypertension can be very severe. It can also be difficult  to control.  Medication is used to control high blood pressure (hypertension). Blood pressure medications that directly affect the renin angiotensin pathway can be used toe help control blood pressure. ACE inhibitors and angiotensin receptor blockers (ARB's) are often effective in patients with unilateral RAS. In some cases, patients with RAS are resistant to these medications.  In patients with bilateral RAS, these medications must be used carefully. They may cause acute renal failure (ARF). If  acute renal failure develops (if creatinine increases by more than 30%), the medication is discontinued. The patient is evaluated for bilateral RAS.  Angioplasty and stenting may be used to improve blood flow. The goal is to improve the circulation of blood flow to the kidney and prevent the release of excess renin, which can help to decrease blood pressure. This helps to prevent atrophy of the kidney. In general, patients with AS-RAS should have stenting done. This is because plasty by itself has a high incidence of re-stenosis.  Surgery to bypass the narrowing may be done. If the kidney with RAS has diminished in size or strength (atrophied ), surgical removal of the kidney may be advised. This is called nephrectomy. Document Released: 02/03/2005 Document Revised: 08/02/2011 Document Reviewed: 05/09/2008 [facility] Patient Information 2014 [place], [place].

## 2013-03-19 NOTE — Progress Notes (Signed)
Subjective:    Patient ID: Corey Oliver, male    DOB: [birth year], 47 y.o.   MRN: [id number]  HPI Corey Oliver presents for on-going difficulty with the management of hypertension. Multiple agents, in combination, have been tried with improvement but not reaching goal.   Patient reports sexual contact who has been diagnosed with trichomonas. He is asymptomatic.  Past Medical History  Diagnosis Date  . Cystic acne   . URI (upper respiratory infection)   . Hypertension   . IBS (irritable bowel syndrome)   . GERD (gastroesophageal reflux disease)   . Hemorrhoids   . Influenza 05/23/12   Past Surgical History  Procedure Laterality Date  . Appendectomy    . Arthroscopic surgery left shoulder      for bone spurs jan '11   Family History  Problem Relation Age of Onset  . Colon cancer Neg Hx   . Ovarian cancer Mother    History   Social History  . Marital Status: Married    Spouse Name: N/A    Number of Children: 1  . Years of Education: 16   Occupational History  .  [employer]    also has several group homes   Social History Main Topics  . Smoking status: Never Smoker   . Smokeless tobacco: Never Used  . Alcohol Use: No  . Drug Use: No  . Sexual Activity: Yes    Partners: Female   Other Topics Concern  . Not on file   Social History Narrative   [employer], [place]- BA business. Married '94. 1 daughter-'2002.  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     Current Outpatient Prescriptions on File Prior to Visit  Medication Sig Dispense Refill  . ACIPHEX 20 MG tablet TAKE 1 TABLET EVERY DAY  30 tablet  5  . HYDROcodone-acetaminophen (VICODIN) 5-500 MG per tablet Take 1 tablet by mouth every 6 (six) hours as needed.  120 tablet  0  . ibuprofen (ADVIL,MOTRIN) 800 MG tablet Take 1 tablet (800 mg total) by mouth every 6 (six) hours as needed.  120 tablet  0  . lisinopril-hydrochlorothiazide (PRINZIDE,ZESTORETIC) 10-12.5 MG  per tablet Take 1 tablet by mouth daily.  30 tablet  5  . nebivolol (BYSTOLIC) 5 MG tablet TAKE 1 TABLET BY MOUTH EVERY DAY  90 tablet  1  . oxyCODONE-acetaminophen (PERCOCET/ROXICET) 5-325 MG per tablet Take 1 tablet by mouth every 4 (four) hours as needed.  180 tablet  0  . Phentolamine Mesylate POWD       . RABEprazole (ACIPHEX) 20 MG tablet Take 1 tablet (20 mg total) by mouth daily.  90 tablet  3  . rizatriptan (MAXALT) 10 MG tablet Take 1 tablet (10 mg total) by mouth as needed for migraine. May repeat in 2 hours if needed  10 tablet  5  . sildenafil (VIAGRA) 100 MG tablet Take 0.5-1 tablets (50-100 mg total) by mouth daily as needed for erectile dysfunction.  5 tablet  11  . triamcinolone cream (KENALOG) 0.1 % Apply topically 2 (two) times daily.  454 g  1  . vardenafil (LEVITRA) 20 MG tablet Take 1 tablet (20 mg total) by mouth daily as needed for erectile dysfunction.  6 tablet  11  . hydrochlorothiazide (HYDRODIURIL) 25 MG tablet Take 1 tablet (25 mg total) by mouth daily.  90 tablet  3   No current facility-administered medications on file prior to visit.  Review of Systems System review is negative for any constitutional, cardiac, pulmonary, GI or neuro symptoms or complaints other than as described in the HPI.     Objective:   Physical Exam Filed Vitals:   03/15/13 1359  BP: 148/100  Pulse: 84  Temp: 97.8 F (36.6 C)   BP Readings from Last 3 Encounters:  03/15/13 148/100  01/31/13 120/96  01/08/13 138/100   Gen;l - WNWD man in no distress Cor- 2+ radial pulse, RRR Pulm - normal respirations Neuro - normal.       Assessment & Plan:  1. Trichomonas - positive exposure history Plan Flagyl 2,000 mg x 1 dose

## 2013-03-19 NOTE — Assessment & Plan Note (Signed)
Difficult to control hypertension despite multiple medication trials.   Plan Renal U/S - r/o RAS  Refer to Dr. Elvis Coil, nephrology, for consultation.

## 2013-03-21 ENCOUNTER — Other Ambulatory Visit: Payer: Self-pay | Admitting: Internal Medicine

## 2013-03-21 DIAGNOSIS — I10 Essential (primary) hypertension: Secondary | ICD-10-CM

## 2013-03-21 NOTE — Telephone Encounter (Signed)
Should not need a refill if he took the original prescription.

## 2013-03-22 MED ORDER — METRONIDAZOLE 500 MG PO TABS: 2000.0000 mg | ORAL_TABLET | Freq: Once | ORAL | Status: DC

## 2013-03-22 NOTE — Addendum Note (Signed)
Addended by: Lyanne Co R on: 03/22/2013 09:11 AM   Modules accepted: Orders

## 2013-03-22 NOTE — Telephone Encounter (Signed)
Lost it? OK to refill

## 2013-03-22 NOTE — Telephone Encounter (Signed)
rx sent

## 2013-04-11 ENCOUNTER — Other Ambulatory Visit: Payer: Self-pay | Admitting: Internal Medicine

## 2013-05-03 ENCOUNTER — Telehealth: Payer: Self-pay

## 2013-05-03 NOTE — Telephone Encounter (Signed)
Patient last seen 03/15/13 and given abx for infection. Pt called back on 03/21/13 requesting a replacement due to original one lost. Pt called requesting another replacement due to taking med with alcohol. Pt will need an office visit for replacement.

## 2013-05-04 ENCOUNTER — Other Ambulatory Visit: Payer: Self-pay | Admitting: Internal Medicine

## 2013-05-04 NOTE — Telephone Encounter (Signed)
Left message to return call 

## 2013-05-04 NOTE — Telephone Encounter (Signed)
Why does he need a refill?

## 2013-05-04 NOTE — Telephone Encounter (Signed)
Pt returned call, states he is requesting refill because he had drink alcohol while taking the first Rx and believes it lessened the effectiveness of it.  Please advise

## 2013-05-07 NOTE — Telephone Encounter (Signed)
Drinking alcohol while taking metronidazole would not lessen the effectiveness. The problem with alcohol and metronidazole is that the combo can make a patient sick - disulfiram type reaction. This is not a reason to retreat.

## 2013-05-07 NOTE — Telephone Encounter (Signed)
Patient has been advised of MD response 

## 2013-05-11 NOTE — Telephone Encounter (Signed)
LMOM two days

## 2013-05-21 ENCOUNTER — Other Ambulatory Visit: Payer: Self-pay | Admitting: Internal Medicine

## 2013-05-21 ENCOUNTER — Telehealth: Payer: Self-pay | Admitting: Internal Medicine

## 2013-05-21 NOTE — Telephone Encounter (Signed)
Pt is having problems with hemorrhoids.  He picked up the medicine today.  He wants to know if he can have an appointment here on Friday to have surgery on them just in case the medicine isn't working.

## 2013-05-21 NOTE — Telephone Encounter (Signed)
No appointments for Friday. If he has an acute thrombosed hemorrhoid he can be added on tomorrow for incision. If he has persistent hemorrhoids that are bothersome that don't respond to treatment he can be referred to GI - they do hemorrhoid banding.

## 2013-05-22 ENCOUNTER — Telehealth: Payer: Self-pay | Admitting: Gastroenterology

## 2013-05-22 NOTE — Telephone Encounter (Signed)
Pt has an appt with his PCP for his hemorrhoids.  He will call back if appt with GI is needed

## 2013-05-22 NOTE — Telephone Encounter (Signed)
Pt states one of the hemorrhoids burst last night.  He is scheduled on Wednesday at 12:00 PM.

## 2013-05-23 ENCOUNTER — Encounter: Payer: Self-pay | Admitting: Internal Medicine

## 2013-05-23 ENCOUNTER — Ambulatory Visit (INDEPENDENT_AMBULATORY_CARE_PROVIDER_SITE_OTHER): Payer: 59 | Admitting: Internal Medicine

## 2013-05-23 VITALS — BP 134/100 | HR 81 | Temp 98.2°F | Wt 209.8 lb

## 2013-05-23 DIAGNOSIS — I10 Essential (primary) hypertension: Secondary | ICD-10-CM

## 2013-05-23 DIAGNOSIS — K645 Perianal venous thrombosis: Secondary | ICD-10-CM

## 2013-05-23 MED ORDER — METRONIDAZOLE 500 MG PO TABS: 2000.0000 mg | ORAL_TABLET | Freq: Once | ORAL | Status: DC

## 2013-05-23 MED ORDER — LISINOPRIL-HYDROCHLOROTHIAZIDE 20-12.5 MG PO TABS: 1.0000 | ORAL_TABLET | Freq: Every day | ORAL | Status: DC

## 2013-05-23 MED ORDER — HYDROCORTISONE ACE-PRAMOXINE 2.5-1 % RE CREA: 1.0000 "application " | TOPICAL_CREAM | Freq: Three times a day (TID) | RECTAL | Status: DC

## 2013-05-23 NOTE — Patient Instructions (Signed)
Large external hemorrhoids - to big for incision and drainage in the office. Plan Conservative treatment:   1. Sitz bath - sitting in a bathtub very warm water. Alternatively - using a very warm washcloth held between the cheeks. It is the heat you need. 5-10 min  2. Apply Analpram 1-2.5% cream to the hemorrhoids (external)  3. Easy bowel movement - no straining. Take benefiber twice a day and senokot at bedtime  4. If no improvement by Friday - call me and I will refer you to Dr. Estelle Grumbles at [facility]    Blood pressure - not well controlled Plan Continue the bystolic 5 mg daily  Will increase lisinopril to 20 mg/12.5mg   That other - Rx sent to [facility]   Hemorrhoids Hemorrhoids are swollen veins around the rectum or anus. There are two types of hemorrhoids:   Internal hemorrhoids. These occur in the veins just inside the rectum. They may poke through to the outside and become irritated and painful.  External hemorrhoids. These occur in the veins outside the anus and can be felt as a painful swelling or hard lump near the anus. CAUSES  Pregnancy.   Obesity.   Constipation or diarrhea.   Straining to have a bowel movement.   Sitting for long periods on the toilet.  Heavy lifting or other activity that caused you to strain.  Anal intercourse. SYMPTOMS   Pain.   Anal itching or irritation.   Rectal bleeding.   Fecal leakage.   Anal swelling.   One or more lumps around the anus.  DIAGNOSIS  Your caregiver may be able to diagnose hemorrhoids by visual examination. Other examinations or tests that may be performed include:   Examination of the rectal area with a gloved hand (digital rectal exam).   Examination of anal canal using a small tube (scope).   A blood test if you have lost a significant amount of blood.  A test to look inside the colon (sigmoidoscopy or colonoscopy). TREATMENT Most hemorrhoids can be treated at home.  However, if symptoms do not seem to be getting better or if you have a lot of rectal bleeding, your caregiver may perform a procedure to help make the hemorrhoids get smaller or remove them completely. Possible treatments include:   Placing a rubber band at the base of the hemorrhoid to cut off the circulation (rubber band ligation).   Injecting a chemical to shrink the hemorrhoid (sclerotherapy).   Using a tool to burn the hemorrhoid (infrared light therapy).   Surgically removing the hemorrhoid (hemorrhoidectomy).   Stapling the hemorrhoid to block blood flow to the tissue (hemorrhoid stapling).  HOME CARE INSTRUCTIONS   Eat foods with fiber, such as whole grains, beans, nuts, fruits, and vegetables. Ask your doctor about taking products with added fiber in them (fibersupplements).  Increase fluid intake. Drink enough water and fluids to keep your urine clear or pale yellow.   Exercise regularly.   Go to the bathroom when you have the urge to have a bowel movement. Do not wait.   Avoid straining to have bowel movements.   Keep the anal area dry and clean. Use wet toilet paper or moist towelettes after a bowel movement.   Medicated creams and suppositories may be used or applied as directed.   Only take over-the-counter or prescription medicines as directed by your caregiver.   Take warm sitz baths for 15 20 minutes, 3 4 times a day to ease pain and discomfort.  Place ice packs on the hemorrhoids if they are tender and swollen. Using ice packs between sitz baths may be helpful.   Put ice in a plastic bag.   Place a towel between your skin and the bag.   Leave the ice on for 15 20 minutes, 3 4 times a day.   Do not use a donut-shaped pillow or sit on the toilet for long periods. This increases blood pooling and pain.  SEEK MEDICAL CARE IF:  You have increasing pain and swelling that is not controlled by treatment or medicine.  You have uncontrolled  bleeding.  You have difficulty or you are unable to have a bowel movement.  You have pain or inflammation outside the area of the hemorrhoids. MAKE SURE YOU:  Understand these instructions.  Will watch your condition.  Will get help right away if you are not doing well or get worse. Document Released: 05/07/2000 Document Revised: 04/26/2012 Document Reviewed: 03/14/2012 [facility] Patient Information 2014 [place], [place]. Hemorrhoids Hemorrhoids are swollen veins around the rectum or anus. There are two types of hemorrhoids:   Internal hemorrhoids. These occur in the veins just inside the rectum. They may poke through to the outside and become irritated and painful.  External hemorrhoids. These occur in the veins outside the anus and can be felt as a painful swelling or hard lump near the anus. CAUSES  Pregnancy.   Obesity.   Constipation or diarrhea.   Straining to have a bowel movement.   Sitting for long periods on the toilet.  Heavy lifting or other activity that caused you to strain.  Anal intercourse. SYMPTOMS   Pain.   Anal itching or irritation.   Rectal bleeding.   Fecal leakage.   Anal swelling.   One or more lumps around the anus.  DIAGNOSIS  Your caregiver may be able to diagnose hemorrhoids by visual examination. Other examinations or tests that may be performed include:   Examination of the rectal area with a gloved hand (digital rectal exam).   Examination of anal canal using a small tube (scope).   A blood test if you have lost a significant amount of blood.  A test to look inside the colon (sigmoidoscopy or colonoscopy). TREATMENT Most hemorrhoids can be treated at home. However, if symptoms do not seem to be getting better or if you have a lot of rectal bleeding, your caregiver may perform a procedure to help make the hemorrhoids get smaller or remove them completely. Possible treatments include:   Placing a rubber band at the  base of the hemorrhoid to cut off the circulation (rubber band ligation).   Injecting a chemical to shrink the hemorrhoid (sclerotherapy).   Using a tool to burn the hemorrhoid (infrared light therapy).   Surgically removing the hemorrhoid (hemorrhoidectomy).   Stapling the hemorrhoid to block blood flow to the tissue (hemorrhoid stapling).  HOME CARE INSTRUCTIONS   Eat foods with fiber, such as whole grains, beans, nuts, fruits, and vegetables. Ask your doctor about taking products with added fiber in them (fibersupplements).  Increase fluid intake. Drink enough water and fluids to keep your urine clear or pale yellow.   Exercise regularly.   Go to the bathroom when you have the urge to have a bowel movement. Do not wait.   Avoid straining to have bowel movements.   Keep the anal area dry and clean. Use wet toilet paper or moist towelettes after a bowel movement.   Medicated creams and suppositories may be  used or applied as directed.   Only take over-the-counter or prescription medicines as directed by your caregiver.   Take warm sitz baths for 15 20 minutes, 3 4 times a day to ease pain and discomfort.   Place ice packs on the hemorrhoids if they are tender and swollen. Using ice packs between sitz baths may be helpful.   Put ice in a plastic bag.   Place a towel between your skin and the bag.   Leave the ice on for 15 20 minutes, 3 4 times a day.   Do not use a donut-shaped pillow or sit on the toilet for long periods. This increases blood pooling and pain.  SEEK MEDICAL CARE IF:  You have increasing pain and swelling that is not controlled by treatment or medicine.  You have uncontrolled bleeding.  You have difficulty or you are unable to have a bowel movement.  You have pain or inflammation outside the area of the hemorrhoids. MAKE SURE YOU:  Understand these instructions.  Will watch your condition.  Will get help right away if you are  not doing well or get worse. Document Released: 05/07/2000 Document Revised: 04/26/2012 Document Reviewed: 03/14/2012 [facility] Patient Information 2014 [place], [place].

## 2013-05-23 NOTE — Progress Notes (Signed)
Pre visit review using our clinic review tool, if applicable. No additional management support is needed unless otherwise documented below in the visit note. 

## 2013-05-25 NOTE — Assessment & Plan Note (Signed)
Blood pressure - not well controlled Plan Continue the bystolic 5 mg daily  Will increase lisinopril to 20 mg/12.5mg 

## 2013-05-25 NOTE — Progress Notes (Signed)
Subjective:    Patient ID: Corey Oliver, male    DOB: 09-12-1965, 48 y.o.   MRN: [id number]  HPI Mr. Ratledge presents for evalutation of hemorrhoids. He reports he has been very uncomfortable and did have spontaneous rupture of a hemorrhoid with bleeding. The pain is better but he is still uncomfortable.  Past Medical History  Diagnosis Date  . Cystic acne   . URI (upper respiratory infection)   . Hypertension   . IBS (irritable bowel syndrome)   . GERD (gastroesophageal reflux disease)   . Hemorrhoids   . Influenza 05/23/12   Past Surgical History  Procedure Laterality Date  . Appendectomy    . Arthroscopic surgery left shoulder      for bone spurs jan '11   Family History  Problem Relation Age of Onset  . Colon cancer Neg Hx   . Ovarian cancer Mother    History   Social History  . Marital Status: Married    Spouse Name: N/A    Number of Children: 1  . Years of Education: 16   Occupational History  .  [employer]    also has several group homes   Social History Main Topics  . Smoking status: Never Smoker   . Smokeless tobacco: Never Used  . Alcohol Use: No  . Drug Use: No  . Sexual Activity: Yes    Partners: Female   Other Topics Concern  . Not on file   Social History Narrative   [place], [place] [place] [employer]. Married '94. 1 daughter-'2002.  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      Current Outpatient Prescriptions on File Prior to Visit  Medication Sig Dispense Refill  . ACIPHEX 20 MG tablet TAKE 1 TABLET EVERY DAY  30 tablet  5  . fluticasone (FLONASE) 50 MCG/ACT nasal spray Place 2 sprays into the nose daily.  16 g  10  . HYDROcodone-acetaminophen (VICODIN) 5-500 MG per tablet Take 1 tablet by mouth every 6 (six) hours as needed.  120 tablet  0  . hydrocortisone (ANUSOL-HC) 25 MG suppository PLACE 1 SUPPOSITORY RECTALLY EVERY 12 HOURS.  12 suppository  0  . ibuprofen (ADVIL,MOTRIN) 800 MG tablet  TAKE 1 TABLET BY MOUTH EVERY 6 HOURS AS NEEDED  120 tablet  1  . nebivolol (BYSTOLIC) 5 MG tablet TAKE 1 TABLET BY MOUTH EVERY DAY  90 tablet  1  . oxyCODONE-acetaminophen (PERCOCET/ROXICET) 5-325 MG per tablet Take 1 tablet by mouth every 4 (four) hours as needed.  180 tablet  0  . Phentolamine Mesylate POWD       . RABEprazole (ACIPHEX) 20 MG tablet Take 1 tablet (20 mg total) by mouth daily.  90 tablet  3  . rizatriptan (MAXALT) 10 MG tablet Take 1 tablet (10 mg total) by mouth as needed for migraine. May repeat in 2 hours if needed  10 tablet  5  . sildenafil (VIAGRA) 100 MG tablet Take 0.5-1 tablets (50-100 mg total) by mouth daily as needed for erectile dysfunction.  5 tablet  11  . triamcinolone cream (KENALOG) 0.1 % Apply topically 2 (two) times daily.  454 g  1  . vardenafil (LEVITRA) 20 MG tablet Take 1 tablet (20 mg total) by mouth daily as needed for erectile dysfunction.  6 tablet  11  . hydrochlorothiazide (HYDRODIURIL) 25 MG tablet Take 1 tablet (25 mg total) by mouth daily.  90 tablet  3   No  current facility-administered medications on file prior to visit.       Review of Systems System review is negative for any constitutional, cardiac, pulmonary, GI or neuro symptoms or complaints other than as described in the HPI.     Objective:   Physical Exam Filed Vitals:   05/23/13 1223  BP: 134/100  Pulse: 81  Temp: 98.2 F (36.8 C)   BP Readings from Last 3 Encounters:  05/23/13 134/100  03/15/13 148/100  01/31/13 120/96   Gen'l- WNWD man in no distress Rectal - very LARGE external hemorrhoids greater on the left.       Assessment & Plan:  Large external hemorrhoids - to big for incision and drainage in the office. Plan Conservative treatment:   1. Sitz bath - sitting in a bathtub very warm water. Alternatively - using a very warm washcloth held between the cheeks. It is the heat you need. 5-10 min  2. Apply Analpram 1-2.5% cream to the hemorrhoids  (external)  3. Easy bowel movement - no straining. Take benefiber twice a day and senokot at bedtime  4. If no improvement by Friday - call me and I will refer you to Dr. Neysa Bonito at [facility]

## 2013-05-29 ENCOUNTER — Other Ambulatory Visit: Payer: Self-pay | Admitting: *Deleted

## 2013-05-29 ENCOUNTER — Telehealth: Payer: Self-pay | Admitting: *Deleted

## 2013-05-29 MED ORDER — HYDROCORTISONE ACE-PRAMOXINE 2.5-1 % RE CREA: 1.0000 "application " | TOPICAL_CREAM | Freq: Three times a day (TID) | RECTAL | Status: DC

## 2013-05-29 NOTE — Telephone Encounter (Signed)
Notified patient that prescription would be available for pick up tomorrow.

## 2013-05-29 NOTE — Telephone Encounter (Signed)
He was supposed to call last Friday if there was no improvement - he did not call.  OK for refill on analpram [facility]

## 2013-05-29 NOTE — Telephone Encounter (Signed)
Patient called regarding appointment he had last week requesting refill for analpram-HC.  States he is leaving to go out of town tomorrow.

## 2013-05-30 ENCOUNTER — Telehealth: Payer: Self-pay

## 2013-05-30 NOTE — Telephone Encounter (Signed)
Script for Anal-pram has been faxed to [facility] [phone]

## 2013-07-04 ENCOUNTER — Other Ambulatory Visit: Payer: Self-pay | Admitting: Nephrology

## 2013-07-04 DIAGNOSIS — I10 Essential (primary) hypertension: Secondary | ICD-10-CM

## 2013-07-06 ENCOUNTER — Other Ambulatory Visit: Payer: 59

## 2013-07-09 ENCOUNTER — Ambulatory Visit
Admission: RE | Admit: 2013-07-09 | Discharge: 2013-07-09 | Disposition: A | Discharge status: Home / Self Care | Payer: 59 | Source: Ambulatory Visit | Attending: Nephrology | Admitting: Nephrology

## 2013-07-09 DIAGNOSIS — I10 Essential (primary) hypertension: Secondary | ICD-10-CM

## 2013-07-13 ENCOUNTER — Other Ambulatory Visit: Payer: Self-pay | Admitting: Cardiology

## 2013-07-13 DIAGNOSIS — N189 Chronic kidney disease, unspecified: Secondary | ICD-10-CM

## 2013-07-18 ENCOUNTER — Encounter (HOSPITAL_COMMUNITY): Payer: 59

## 2013-07-25 ENCOUNTER — Telehealth: Payer: Self-pay | Admitting: *Deleted

## 2013-07-25 NOTE — Telephone Encounter (Signed)
Patient phoned stating he had "?" on his forehead and was requesting PCP prescribe a "cream or something".  Returned patient's phone call to clarify, no answer, no voicemail

## 2013-07-26 ENCOUNTER — Ambulatory Visit (INDEPENDENT_AMBULATORY_CARE_PROVIDER_SITE_OTHER): Payer: 59 | Admitting: Internal Medicine

## 2013-07-26 ENCOUNTER — Encounter: Payer: Self-pay | Admitting: Internal Medicine

## 2013-07-26 VITALS — BP 128/90 | HR 79 | Temp 99.1°F | Wt 212.8 lb

## 2013-07-26 DIAGNOSIS — G4733 Obstructive sleep apnea (adult) (pediatric): Secondary | ICD-10-CM

## 2013-07-26 DIAGNOSIS — I10 Essential (primary) hypertension: Secondary | ICD-10-CM

## 2013-07-26 MED ORDER — CLOTRIMAZOLE-BETAMETHASONE 1-0.05 % EX CREA: 1.0000 "application " | TOPICAL_CREAM | Freq: Two times a day (BID) | CUTANEOUS | Status: DC

## 2013-07-26 MED ORDER — LISINOPRIL-HYDROCHLOROTHIAZIDE 20-12.5 MG PO TABS: 1.0000 | ORAL_TABLET | Freq: Every day | ORAL | Status: DC

## 2013-07-26 NOTE — Progress Notes (Signed)
Pre visit review using our clinic review tool, if applicable. No additional management support is needed unless otherwise documented below in the visit note. 

## 2013-07-30 NOTE — Assessment & Plan Note (Signed)
BP Readings from Last 3 Encounters:  07/26/13 128/90  05/23/13 134/100  03/15/13 148/100   Better control.  Plan Continue present medications  Renal Artery U/S to complete workup.

## 2013-07-30 NOTE — Progress Notes (Signed)
Subjective:    Patient ID: Corey Oliver, male    DOB: [birth year], 48 y.o.   MRN: [id number]  HPI Corey Oliver has been recently treated x 2 for STD. He reports that he is clear of symptoms at this time.  He presents today for a fungal infection at the hairline right forehead and at the left corner of his beard. He believes he got t. [employer] and [place] at [employer]. He has tried lotrimin w/o success. There is minor itching.  He has been seen by nephrology for difficult to control hypertension but is now doing better. He had a normal renal U/S and is scheduled for Renal Artery U/S to r/o RAS  PMH, FamHx and SocHx reviewed for any changes and relevance.  Current Outpatient Prescriptions on File Prior to Visit  Medication Sig Dispense Refill  . ACIPHEX 20 MG tablet TAKE 1 TABLET EVERY DAY  30 tablet  5  . fluticasone (FLONASE) 50 MCG/ACT nasal spray Place 2 sprays into the nose daily.  16 g  10  . HYDROcodone-acetaminophen (VICODIN) 5-500 MG per tablet Take 1 tablet by mouth every 6 (six) hours as needed.  120 tablet  0  . hydrocortisone (ANUSOL-HC) 25 MG suppository PLACE 1 SUPPOSITORY RECTALLY EVERY 12 HOURS.  12 suppository  0  . hydrocortisone-pramoxine (ANALPRAM-HC) 2.5-1 % rectal cream Place 1 application rectally 3 (three) times daily.  30 g  0  . ibuprofen (ADVIL,MOTRIN) 800 MG tablet TAKE 1 TABLET BY MOUTH EVERY 6 HOURS AS NEEDED  120 tablet  1  . metroNIDAZOLE (FLAGYL) 500 MG tablet Take 4 tablets (2,000 mg total) by mouth once.  4 tablet  0  . nebivolol (BYSTOLIC) 5 MG tablet TAKE 1 TABLET BY MOUTH EVERY DAY  90 tablet  1  . oxyCODONE-acetaminophen (PERCOCET/ROXICET) 5-325 MG per tablet Take 1 tablet by mouth every 4 (four) hours as needed.  180 tablet  0  . Phentolamine Mesylate POWD       . RABEprazole (ACIPHEX) 20 MG tablet Take 1 tablet (20 mg total) by mouth daily.  90 tablet  3  . rizatriptan (MAXALT) 10 MG tablet Take 1 tablet (10 mg total) by mouth as needed for migraine.  May repeat in 2 hours if needed  10 tablet  5  . sildenafil (VIAGRA) 100 MG tablet Take 0.5-1 tablets (50-100 mg total) by mouth daily as needed for erectile dysfunction.  5 tablet  11  . triamcinolone cream (KENALOG) 0.1 % Apply topically 2 (two) times daily.  454 g  1  . vardenafil (LEVITRA) 20 MG tablet Take 1 tablet (20 mg total) by mouth daily as needed for erectile dysfunction.  6 tablet  11  . hydrochlorothiazide (HYDRODIURIL) 25 MG tablet Take 1 tablet (25 mg total) by mouth daily.  90 tablet  3   No current facility-administered medications on file prior to visit.      Review of Systems System review is negative for any constitutional, cardiac, pulmonary, GI or neuro symptoms or complaints other than as described in the HPI.     Objective:   Physical Exam Filed Vitals:   07/26/13 1640  BP: 128/90  Pulse: 79  Temp: 99.1 F (37.3 C)   Gen'l- WNWD man in no distress Cor- RRR Pulm - CTAP Derm - circular, raised rash right forehead at hairline; circular lesion at left corner of the beard.       Assessment & Plan:  Tinea - two lesions that do not  have crusting.  Plan lotrisone applied BID  For failure to resolve will refer to dermatology.

## 2013-08-02 ENCOUNTER — Telehealth: Payer: Self-pay | Admitting: *Deleted

## 2013-08-02 ENCOUNTER — Telehealth: Payer: Self-pay | Admitting: Internal Medicine

## 2013-08-02 MED ORDER — LOSARTAN POTASSIUM-HCTZ 100-25 MG PO TABS: 1.0000 | ORAL_TABLET | Freq: Every day | ORAL | Status: DC

## 2013-08-02 NOTE — Telephone Encounter (Signed)
Patient phoned & left voicemail message on triage line with "bp med problems" and wanting meds changed, but was no more specific.  Phoned patient back, no message, left voicemail message to call back.

## 2013-08-02 NOTE — Telephone Encounter (Signed)
Ok. Stop lisinopril/hct, new rx for losartan/hct 100/25 sent to drugstore. Sig 1 po daily.

## 2013-08-02 NOTE — Telephone Encounter (Signed)
Patient is calling to inquire about having his blood pressure medication changed. He believes that the lisinopril-hydrochlorothiazide (ZESTORETIC) 20-12.5 MG medication is making him sweat and giving him a dry cough. Please advise.

## 2013-08-03 NOTE — Telephone Encounter (Signed)
Called pt and he has been advised to stop lisinopril/hct and start losartan/hct. He had no questions/concerns

## 2013-08-10 ENCOUNTER — Encounter (HOSPITAL_COMMUNITY): Payer: 59

## 2013-08-24 ENCOUNTER — Ambulatory Visit (HOSPITAL_COMMUNITY): Payer: 59 | Attending: Nephrology | Admitting: Cardiology

## 2013-08-24 DIAGNOSIS — I10 Essential (primary) hypertension: Secondary | ICD-10-CM | POA: Insufficient documentation

## 2013-08-24 DIAGNOSIS — E785 Hyperlipidemia, unspecified: Secondary | ICD-10-CM | POA: Insufficient documentation

## 2013-08-24 DIAGNOSIS — N189 Chronic kidney disease, unspecified: Secondary | ICD-10-CM

## 2013-08-24 NOTE — Progress Notes (Signed)
Renal artery duplex performed  

## 2013-08-29 ENCOUNTER — Encounter (HOSPITAL_BASED_OUTPATIENT_CLINIC_OR_DEPARTMENT_OTHER): Payer: 59

## 2013-09-07 ENCOUNTER — Ambulatory Visit (HOSPITAL_BASED_OUTPATIENT_CLINIC_OR_DEPARTMENT_OTHER): Payer: 59 | Attending: Internal Medicine

## 2013-09-07 DIAGNOSIS — G4730 Sleep apnea, unspecified: Principal | ICD-10-CM

## 2013-09-07 DIAGNOSIS — G4733 Obstructive sleep apnea (adult) (pediatric): Secondary | ICD-10-CM

## 2013-09-07 DIAGNOSIS — G4710 Hypersomnia, unspecified: Secondary | ICD-10-CM | POA: Insufficient documentation

## 2013-09-11 DIAGNOSIS — G4733 Obstructive sleep apnea (adult) (pediatric): Secondary | ICD-10-CM

## 2013-09-11 DIAGNOSIS — G4710 Hypersomnia, unspecified: Secondary | ICD-10-CM

## 2013-09-11 DIAGNOSIS — G4730 Sleep apnea, unspecified: Secondary | ICD-10-CM

## 2013-09-11 NOTE — Sleep Study (Signed)
   NAME: Corey Oliver DATE OF BIRTH:  1966/02/27 MEDICAL RECORD NUMBER [id number]  LOCATION: [facility]  PHYSICIAN: Armando Reichert Elihue Ebert  DATE OF STUDY: 09/07/2013  SLEEP STUDY TYPE: Nocturnal Polysomnogram               REFERRING PHYSICIAN: Norins, Heinz Knuckles, MD  INDICATION FOR STUDY: Hypersomnia with sleep apnea  EPWORTH SLEEPINESS SCORE:  10 HEIGHT:    WEIGHT:      There is no weight on file to calculate BMI.  NECK SIZE: 16.5 in.  MEDICATIONS: Reviewed in sleep chart  SLEEP ARCHITECTURE: The patient had a total sleep time of 360 minutes, with no slow-wave sleep and only 69 minutes of REM. Sleep onset latency was normal at 24 minutes, and REM onset was normal at 63 minutes. Sleep efficiency was adequate at 92%.  RESPIRATORY DATA: The patient was found to have 67 apneas and 22 obstructive hypopneas, giving him an AHI of 15 events per hour. The events occurred in all body positions, but were increased during REM. Loud snoring was noted throughout.  OXYGEN DATA: There was oxygen desaturation as low as 78% with the patient's obstructive events  CARDIAC DATA: Rare PVC noted  MOVEMENT/PARASOMNIA: No significant limb movements or other abnormal behaviors were seen.  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 Treatment for this degree of sleep apnea can include a trial of weight loss alone, upper airway surgery, dental appliance, and also CPAP. Clinical correlation is suggested.  2) rare PVC noted, but no clinically significant arrhythmias were seen     Kathee Delton Diplomate, American Board of Sleep Medicine  ELECTRONICALLY SIGNED ON:  09/11/2013, 7:10 PM [place] PH: [phone]   FX: [phone] [place]

## 2013-10-05 ENCOUNTER — Encounter (HOSPITAL_BASED_OUTPATIENT_CLINIC_OR_DEPARTMENT_OTHER): Payer: 59

## 2014-01-18 ENCOUNTER — Other Ambulatory Visit: Payer: Self-pay

## 2014-01-18 MED ORDER — RABEPRAZOLE SODIUM 20 MG PO TBEC: DELAYED_RELEASE_TABLET | ORAL | Status: DC

## 2014-04-15 ENCOUNTER — Encounter: Payer: Self-pay | Admitting: Internal Medicine

## 2014-04-15 ENCOUNTER — Ambulatory Visit (INDEPENDENT_AMBULATORY_CARE_PROVIDER_SITE_OTHER): Payer: 59 | Admitting: Internal Medicine

## 2014-04-15 VITALS — BP 120/80 | HR 105 | Temp 98.3°F | Ht 66.0 in | Wt 218.0 lb

## 2014-04-15 DIAGNOSIS — G4733 Obstructive sleep apnea (adult) (pediatric): Secondary | ICD-10-CM

## 2014-04-15 DIAGNOSIS — M25512 Pain in left shoulder: Secondary | ICD-10-CM

## 2014-04-15 DIAGNOSIS — I10 Essential (primary) hypertension: Secondary | ICD-10-CM

## 2014-04-15 MED ORDER — SILDENAFIL CITRATE 100 MG PO TABS: 50.0000 mg | ORAL_TABLET | Freq: Every day | ORAL | Status: DC | PRN

## 2014-04-15 MED ORDER — OXYCODONE-ACETAMINOPHEN 5-325 MG PO TABS: 1.0000 | ORAL_TABLET | ORAL | Status: DC | PRN

## 2014-04-15 NOTE — Assessment & Plan Note (Signed)
Potential benefits of a long term opioids use as well as potential risks (i.e. addiction risk, apnea etc) and complications (i.e. Somnolence, constipation and others) were explained to the patient and were aknowledged.    s/p arthoscopic surgery for shoulder spurs '12  Percocet prn - rare use

## 2014-04-15 NOTE — Progress Notes (Signed)
Subjective:     HPI New pt - former pt of Dr MEN  C/o possible OSA - he sleeps on his stomach all night... F/u HTN, chronic shoulder pain, ED   Review of Systems  Constitutional: Positive for fatigue. Negative for appetite change and unexpected weight change.  HENT: Negative for congestion, nosebleeds, sneezing, sore throat and trouble swallowing.   Eyes: Negative for itching and visual disturbance.  Respiratory: Negative for cough.   Cardiovascular: Negative for chest pain, palpitations and leg swelling.  Gastrointestinal: Negative for nausea, diarrhea, blood in stool and abdominal distention.  Genitourinary: Negative for frequency and hematuria.  Musculoskeletal: Negative for back pain, joint swelling, gait problem and neck pain.  Skin: Negative for rash.  Neurological: Negative for dizziness, tremors, speech difficulty and weakness.  Psychiatric/Behavioral: Negative for sleep disturbance, dysphoric mood and agitation. The patient is not nervous/anxious.        Objective:   Physical Exam  Constitutional: He is oriented to person, place, and time. He appears well-developed. No distress.  NAD  HENT:  Mouth/Throat: Oropharynx is clear and moist.  Eyes: Conjunctivae are normal. Pupils are equal, round, and reactive to light.  Neck: Normal range of motion. No JVD present. No thyromegaly present.  Cardiovascular: Normal rate, regular rhythm, normal heart sounds and intact distal pulses.  Exam reveals no gallop and no friction rub.   No murmur heard. Pulmonary/Chest: Effort normal and breath sounds normal. No respiratory distress. He has no wheezes. He has no rales. He exhibits no tenderness.  Abdominal: Soft. Bowel sounds are normal. He exhibits no distension and no mass. There is no tenderness. There is no rebound and no guarding.  Musculoskeletal: Normal range of motion. He exhibits no edema or tenderness.  Lymphadenopathy:    He has no cervical adenopathy.  Neurological: He is  alert and oriented to person, place, and time. He has normal reflexes. No cranial nerve deficit. He exhibits normal muscle tone. He displays a negative Romberg sign. Coordination and gait normal.  Skin: Skin is warm and dry. No rash noted.  Psychiatric: He has a normal mood and affect. His behavior is normal. Judgment and thought content normal.  L shoulder is tender w/ROM     Assessment:         Plan:

## 2014-04-15 NOTE — Assessment & Plan Note (Signed)
Continue with current prescription therapy as reflected on the Med list.  

## 2014-04-15 NOTE — Assessment & Plan Note (Addendum)
Sleep study 4/15 - mild to mod He sleeps on his stomach  4/15: Mild to moderate obstructive sleep apnea/hypopnea syndrome, with an AHI of 15 events per hour and oxygen desaturation as low as 78%. Treatment for this degree of sleep apnea can include a trial of weight loss alone, upper airway surgery, dental appliance, and also CPAP. Clinical correlation is suggested.  Will consult Dr Gwenette Greet if needed. In the meantime - he will get a wedge pillow w/a face cut-out

## 2014-04-15 NOTE — Progress Notes (Signed)
Pre visit review using our clinic review tool, if applicable. No additional management support is needed unless otherwise documented below in the visit note. 

## 2014-04-16 ENCOUNTER — Telehealth: Payer: Self-pay | Admitting: Internal Medicine

## 2014-04-16 NOTE — Telephone Encounter (Signed)
emmi emailed °

## 2014-05-13 ENCOUNTER — Other Ambulatory Visit: Payer: Self-pay | Admitting: Internal Medicine

## 2014-07-25 ENCOUNTER — Ambulatory Visit (INDEPENDENT_AMBULATORY_CARE_PROVIDER_SITE_OTHER): Payer: 59 | Admitting: Pulmonary Disease

## 2014-07-25 ENCOUNTER — Encounter: Payer: Self-pay | Admitting: Pulmonary Disease

## 2014-07-25 VITALS — BP 112/88 | HR 113 | Temp 97.9°F | Ht 66.0 in | Wt 219.0 lb

## 2014-07-25 DIAGNOSIS — G4733 Obstructive sleep apnea (adult) (pediatric): Secondary | ICD-10-CM

## 2014-07-25 NOTE — Progress Notes (Signed)
Chief Complaint  Patient presents with  . SLEEP CONSULT    Referred by Dr Marval Regal. Sleep Study with [facility] 2015. Epworth Score: 10    History of Present Illness: Corey Oliver is a 49 y.o. male for evaluation of sleep problems.  He is followed by nephrology for hypertension.  He had sleep study from one year ago and was found to have mild/moderate sleep apnea.  He was advised to have further assessment of his sleep apnea.  He snores, and will sometimes stop breathing while asleep.  He is a Psychiatrist.  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  He feels like the "dangly thing in his throat" blocks his breathing.  His mouth can get dry at night.  He goes to sleep at 11 pm.  He falls asleep quickly.  He wakes up 1 or 2 times to use the bathroom.  He gets out of bed at 615 am.  He feels okay in the morning.  He denies morning headache.  He does not use anything to help him fall sleep or stay awake.  He denies sleep walking, sleep talking, bruxism, or nightmares.  There is no history of restless legs.  He denies sleep hallucinations, sleep paralysis, or cataplexy.  The Epworth score is 10 out of 24.  Tests: PSG 09/11/13 >> AHI 15, SaO2 78%  Naol A Shamblin  has a past medical history of Cystic acne; URI (upper respiratory infection); Hypertension; IBS (irritable bowel syndrome); GERD (gastroesophageal reflux disease); Hemorrhoids; and Influenza (05/23/12).  NICHOALS HEYDE  has past surgical history that includes Appendectomy and arthroscopic surgery left shoulder.  Prior to Admission medications   Medication Sig Start Date End Date Taking? Authorizing Provider  ACIPHEX 20 MG tablet TAKE 1 TABLET EVERY DAY 05/13/14   Aleksei Plotnikov V, MD  clotrimazole-betamethasone (LOTRISONE) cream Apply 1 application topically 2 (two) times daily. 07/26/13   Neena Rhymes, MD  fluticasone (FLONASE) 50 MCG/ACT nasal spray Place 2 sprays into the nose daily. 03/15/13   Neena Rhymes, MD  hydrocortisone  (ANUSOL-HC) 25 MG suppository PLACE 1 SUPPOSITORY RECTALLY EVERY 12 HOURS. 05/21/13   Neena Rhymes, MD  hydrocortisone-pramoxine [facility]) 2.5-1 % rectal cream Place 1 application rectally 3 (three) times daily. 05/29/13   Neena Rhymes, MD  ibuprofen (ADVIL,MOTRIN) 800 MG tablet TAKE 1 TABLET BY MOUTH EVERY 6 HOURS AS NEEDED 04/11/13   Neena Rhymes, MD  losartan-hydrochlorothiazide (HYZAAR) 100-25 MG per tablet Take 1 tablet by mouth daily. 08/02/13   Neena Rhymes, MD  nebivolol (BYSTOLIC) 5 MG tablet TAKE 1 TABLET BY MOUTH EVERY DAY Patient not taking: Reported on 04/15/2014 01/08/13   Neena Rhymes, MD  oxyCODONE-acetaminophen (PERCOCET/ROXICET) 5-325 MG per tablet Take 1 tablet by mouth every 4 (four) hours as needed. 04/15/14   Cassandria Anger, MD  Phentolamine Mesylate POWD  07/02/11   Historical Provider, MD  RABEprazole (ACIPHEX) 20 MG tablet Take 1 tablet (20 mg total) by mouth daily. 01/08/13 01/08/14  Neena Rhymes, MD  sildenafil (VIAGRA) 100 MG tablet Take 0.5-1 tablets (50-100 mg total) by mouth daily as needed for erectile dysfunction. 04/15/14   Aleksei Plotnikov V, MD  triamcinolone cream (KENALOG) 0.1 % Apply topically 2 (two) times daily. 01/31/13   Neena Rhymes, MD  vardenafil (LEVITRA) 20 MG tablet Take 1 tablet (20 mg total) by mouth daily as needed for erectile dysfunction. 01/20/12 03/31/15  Neena Rhymes, MD    No Known Allergies  His family history includes Ovarian cancer in his mother. There is no history of Colon cancer.  He  reports that he has never smoked. He has never used smokeless tobacco. He reports that he does not drink alcohol or use illicit drugs.  Review of Systems  Constitutional: Negative for fever and unexpected weight change.  HENT: Negative for congestion, dental problem, ear pain, nosebleeds, postnasal drip, rhinorrhea, sinus pressure, sneezing, sore throat and trouble swallowing.   Eyes: Negative for redness and itching.   Respiratory: Positive for wheezing. Negative for cough, chest tightness and shortness of breath.   Cardiovascular: Negative for palpitations and leg swelling.  Gastrointestinal: Negative for nausea and vomiting.  Genitourinary: Negative for dysuria.  Musculoskeletal: Negative for joint swelling.  Skin: Negative for rash.  Neurological: Negative for headaches.  Hematological: Does not bruise/bleed easily.  Psychiatric/Behavioral: Negative for dysphoric mood. The patient is not nervous/anxious.    Physical Exam:  General - No distress ENT - No sinus tenderness, no oral exudate, no LAN, no thyromegaly, TM clear, pupils equal/reactive, MP 4, enlarged tongue, 2+ tonsils, elongated uvula Cardiac - s1s2 regular, no murmur, pulses symmetric Chest - No wheeze/rales/dullness, good air entry, normal respiratory excursion Back - No focal tenderness Abd - Soft, non-tender, no organomegaly, + bowel sounds Ext - No edema Neuro - Normal strength, cranial nerves intact Skin - No rashes Psych - Normal mood, and behavior  Discussion: He has snoring, sleep disruption, apnea, and daytime sleepiness.  He has hx of HTN.  His sleep study from 2015 shows mild/moderate sleep apnea.  I have reviewed the recent sleep study results with the patient.  We discussed how sleep apnea can affect various health problems including risks for hypertension, cardiovascular disease, and diabetes.  We also discussed how sleep disruption can increase risks for accident, such as while driving.  Weight loss as a means of improving sleep apnea was also reviewed.  Additional treatment options discussed were CPAP therapy, oral appliance, and surgical intervention.  Assessment/plan:  Obstructive sleep apnea. Plan: - he would like to try option of oral appliance first - will arrange for referral to Dr. Oneal Grout to assess for oral appliance to treat his obstructive sleep apnea - he will need f/u sleep study once his oral appliance  is adequately adjusted   Chesley Mires, M.D. Pager [phone]

## 2014-07-25 NOTE — Progress Notes (Deleted)
   Subjective:    Patient ID: Corey Oliver, male    DOB: [birth year], 49 y.o.   MRN: [id number]  HPI    Review of Systems  Constitutional: Negative for fever and unexpected weight change.  HENT: Negative for congestion, dental problem, ear pain, nosebleeds, postnasal drip, rhinorrhea, sinus pressure, sneezing, sore throat and trouble swallowing.   Eyes: Negative for redness and itching.  Respiratory: Positive for wheezing. Negative for cough, chest tightness and shortness of breath.   Cardiovascular: Negative for palpitations and leg swelling.  Gastrointestinal: Negative for nausea and vomiting.  Genitourinary: Negative for dysuria.  Musculoskeletal: Negative for joint swelling.  Skin: Negative for rash.  Neurological: Negative for headaches.  Hematological: Does not bruise/bleed easily.  Psychiatric/Behavioral: Negative for dysphoric mood. The patient is not nervous/anxious.        Objective:   Physical Exam        Assessment & Plan:

## 2014-07-25 NOTE — Patient Instructions (Signed)
Will arrange for referral to Dr. Mark Katz to assess for oral appliance to treat obstructive sleep apnea  Follow up in 6 months 

## 2014-08-22 ENCOUNTER — Other Ambulatory Visit: Payer: Self-pay | Admitting: *Deleted

## 2014-08-22 MED ORDER — LOSARTAN POTASSIUM-HCTZ 100-25 MG PO TABS: 1.0000 | ORAL_TABLET | Freq: Every day | ORAL | Status: DC

## 2014-08-25 ENCOUNTER — Encounter (HOSPITAL_BASED_OUTPATIENT_CLINIC_OR_DEPARTMENT_OTHER): Payer: Self-pay

## 2014-08-25 ENCOUNTER — Emergency Department (HOSPITAL_BASED_OUTPATIENT_CLINIC_OR_DEPARTMENT_OTHER)
Admission: EM | Admit: 2014-08-25 | Discharge: 2014-08-26 | Disposition: A | Discharge status: Home / Self Care | Payer: 59 | Attending: Emergency Medicine | Admitting: Emergency Medicine

## 2014-08-25 DIAGNOSIS — J111 Influenza due to unidentified influenza virus with other respiratory manifestations: Secondary | ICD-10-CM | POA: Diagnosis not present

## 2014-08-25 DIAGNOSIS — Z7951 Long term (current) use of inhaled steroids: Secondary | ICD-10-CM | POA: Diagnosis not present

## 2014-08-25 DIAGNOSIS — J9801 Acute bronchospasm: Secondary | ICD-10-CM | POA: Insufficient documentation

## 2014-08-25 DIAGNOSIS — Z79899 Other long term (current) drug therapy: Secondary | ICD-10-CM | POA: Diagnosis not present

## 2014-08-25 DIAGNOSIS — Z872 Personal history of diseases of the skin and subcutaneous tissue: Secondary | ICD-10-CM | POA: Insufficient documentation

## 2014-08-25 DIAGNOSIS — I10 Essential (primary) hypertension: Secondary | ICD-10-CM | POA: Diagnosis not present

## 2014-08-25 DIAGNOSIS — Z7952 Long term (current) use of systemic steroids: Secondary | ICD-10-CM | POA: Insufficient documentation

## 2014-08-25 DIAGNOSIS — R05 Cough: Secondary | ICD-10-CM | POA: Diagnosis present

## 2014-08-25 DIAGNOSIS — K219 Gastro-esophageal reflux disease without esophagitis: Secondary | ICD-10-CM | POA: Diagnosis not present

## 2014-08-25 DIAGNOSIS — J40 Bronchitis, not specified as acute or chronic: Secondary | ICD-10-CM | POA: Insufficient documentation

## 2014-08-25 DIAGNOSIS — J209 Acute bronchitis, unspecified: Secondary | ICD-10-CM

## 2014-08-25 LAB — RAPID STREP SCREEN (MED CTR MEBANE ONLY): Streptococcus, Group A Screen (Direct): NEGATIVE

## 2014-08-25 MED ORDER — ACETAMINOPHEN 325 MG PO TABS
650.0000 mg | ORAL_TABLET | Freq: Once | ORAL | Status: AC
  Administered 2014-08-25: 650 mg via ORAL
  Filled 2014-08-25: qty 2

## 2014-08-25 MED ORDER — PHENYLEPH-PROMETHAZINE-COD 5-6.25-10 MG/5ML PO SYRP: ORAL_SOLUTION | ORAL | Status: DC

## 2014-08-25 MED ORDER — ALBUTEROL SULFATE HFA 108 (90 BASE) MCG/ACT IN AERS
2.0000 | INHALATION_SPRAY | RESPIRATORY_TRACT | Status: DC | PRN
  Administered 2014-08-25: 2 via RESPIRATORY_TRACT
  Filled 2014-08-25: qty 6.7

## 2014-08-25 NOTE — ED Provider Notes (Signed)
CSN: [id number]     Arrival date & time 08/25/14  2046 History  This chart was scribed for Corey Rosser, MD by Edison Simon, ED Scribe. This patient was seen in room MH02/MH02 and the patient's care was started at 11:38 PM.    Chief Complaint  Patient presents with  . Flu-Like Symptoms    The history is provided by the patient. No language interpreter was used.    HPI Comments: Corey Oliver is a 49 y.o. male who presents to the Emergency Department complaining of flu-like symptoms that started this morning. Symptoms include body aches, fever, sore throat, rhinorrhea, nasal congestion, cough, pain with coughing, shortness of breath and diarrhea. He states he did not have a flu shot this season. He denies history of asthma. Symptoms are moderate. He denies nausea or vomiting.  Past Medical History  Diagnosis Date  . Cystic acne   . URI (upper respiratory infection)   . Hypertension   . IBS (irritable bowel syndrome)   . GERD (gastroesophageal reflux disease)   . Hemorrhoids   . Influenza 05/23/12   Past Surgical History  Procedure Laterality Date  . Appendectomy    . Arthroscopic surgery left shoulder      for bone spurs jan '11   Family History  Problem Relation Age of Onset  . Colon cancer Neg Hx   . Ovarian cancer Mother    History  Substance Use Topics  . Smoking status: Never Smoker   . Smokeless tobacco: Never Used  . Alcohol Use: No    Review of Systems A complete 10 system review of systems was obtained and all systems are negative except as noted in the HPI and PMH.    Allergies  Review of patient's allergies indicates no known allergies.  Home Medications   Prior to Admission medications   Medication Sig Start Date End Date Taking? Authorizing Provider  ACIPHEX 20 MG tablet TAKE 1 TABLET EVERY DAY 05/13/14   Aleksei Plotnikov V, MD  fluticasone (FLONASE) 50 MCG/ACT nasal spray Place 2 sprays into the nose daily. 03/15/13   Neena Rhymes, MD  hydrocortisone  (ANUSOL-HC) 25 MG suppository PLACE 1 SUPPOSITORY RECTALLY EVERY 12 HOURS. 05/21/13   Neena Rhymes, MD  hydrocortisone-pramoxine [facility]) 2.5-1 % rectal cream Place 1 application rectally 3 (three) times daily. 05/29/13   Neena Rhymes, MD  ibuprofen (ADVIL,MOTRIN) 800 MG tablet TAKE 1 TABLET BY MOUTH EVERY 6 HOURS AS NEEDED 04/11/13   Neena Rhymes, MD  losartan-hydrochlorothiazide (HYZAAR) 100-25 MG per tablet Take 1 tablet by mouth daily. 08/22/14   Aleksei Plotnikov V, MD  Phenyleph-Promethazine-Cod (PROMETHAZINE VC/CODEINE) 5-6.25-10 MG/5ML SYRP Take 48mL every four hours as needed for cough. 08/25/14   Edithe Dobbin, MD  RABEprazole (ACIPHEX) 20 MG tablet Take 1 tablet (20 mg total) by mouth daily. 01/08/13 01/08/14  Neena Rhymes, MD   BP 142/106 mmHg  Pulse 118  Temp(Src) 100.8 F (38.2 C) (Oral)  Resp 20  Ht 5\' 6"  (1.676 m)  Wt 210 lb (95.255 kg)  BMI 33.91 kg/m2  SpO2 100%   Physical Exam  Nursing note and vitals reviewed. General: Well-developed, well-nourished male in no acute distress; appearance consistent with age of record HENT: normocephalic; atraumatic; mild pharyngeal erythema without exudate Eyes: pupils equal, round and reactive to light; extraocular muscles intact Neck: supple; no lymphadenopathy Heart: regular rate and rhythm Lungs: Coarse sounds bilaterally with decreased air movement and coughing on attempted deep breathing Abdomen: soft; nondistended;  nontender; bowel sounds present Extremities: No deformity; full range of motion Neurologic: Awake, alert and oriented; motor function intact in all extremities and symmetric; no facial droop Skin: Warm and dry Psychiatric: Normal mood and affect   ED Course  Procedures (including critical care time)  DIAGNOSTIC STUDIES: Oxygen Saturation is 100% on room air, normal by my interpretation.    COORDINATION OF CARE: 11:41 PM Discussed treatment plan with patient at beside, the patient agrees with the  plan and has no further questions at this time.   MDM   Nursing notes and vitals signs, including pulse oximetry, reviewed.  Summary of this visit's results, reviewed by myself:  Labs:  Results for orders placed or performed during the hospital encounter of 08/25/14 (from the past 24 hour(s))  Rapid strep screen     Status: None   Collection Time: 08/25/14  9:27 PM  Result Value Ref Range   Streptococcus, Group A Screen (Direct) NEGATIVE NEGATIVE   Symptoms consistent with influenza. Patient not in risk group for which Tamiflu is recommended.  Final diagnoses:  Influenza  Bronchitis with bronchospasm   I personally performed the services described in this documentation, which was scribed in my presence. The recorded information has been reviewed and is accurate.   Corey Rosser, MD 08/25/14 [phone]

## 2014-08-25 NOTE — ED Notes (Addendum)
Pt reports awoke this morning with cough, congestion, sore throat, small amount of diarrhea.  Low grade fever.  Took excedrin @ 0900.

## 2014-08-25 NOTE — ED Notes (Signed)
EDP Dr. Florina Ou at [facility], pt seen by EDP prior to RN assessment, see MD notes, pending orders.

## 2014-08-25 NOTE — Discharge Instructions (Signed)

## 2014-08-28 LAB — CULTURE, GROUP A STREP: Strep A Culture: NEGATIVE

## 2014-09-03 ENCOUNTER — Telehealth: Payer: Self-pay | Admitting: Internal Medicine

## 2014-09-03 MED ORDER — ALBUTEROL SULFATE 108 (90 BASE) MCG/ACT IN AEPB: 1.0000 | INHALATION_SPRAY | Freq: Four times a day (QID) | RESPIRATORY_TRACT | Status: DC | PRN

## 2014-09-03 NOTE — Telephone Encounter (Signed)
Reg Rf on Proventil HFA to go to [facility] and advise below.

## 2014-09-03 NOTE — Telephone Encounter (Signed)
Pt called to check up on this request. If also want to know if Dr. Camila Li will keep him out of work until Monday due to symptoms that he still experiencing. Please call pt back

## 2014-09-03 NOTE — Telephone Encounter (Signed)
Patient went to urgent care last Sunday and was diagnosed with flu and was given medication. They took him out of work till 04/13. He still has cough and runny nose and he is wondering if you can keep him out till Monday. They also gave him an inhaler for his chest. I don't see it on his med list. He was wondering if he can get another one.

## 2014-09-03 NOTE — Telephone Encounter (Signed)
Pt informed. I faxed signed OOW note to [phone]. Attention [facility] department as well as to patient himself at [phone].

## 2014-09-03 NOTE — Telephone Encounter (Signed)
Ok to work on [employer] inhaler - emailed OV if not better [employer]

## 2014-09-10 DIAGNOSIS — Z0279 Encounter for issue of other medical certificate: Secondary | ICD-10-CM

## 2014-11-06 ENCOUNTER — Encounter: Payer: Self-pay | Admitting: Internal Medicine

## 2014-11-06 ENCOUNTER — Ambulatory Visit (INDEPENDENT_AMBULATORY_CARE_PROVIDER_SITE_OTHER): Payer: 59 | Admitting: Internal Medicine

## 2014-11-06 VITALS — BP 118/90 | HR 96 | Wt 212.0 lb

## 2014-11-06 DIAGNOSIS — M25511 Pain in right shoulder: Secondary | ICD-10-CM | POA: Insufficient documentation

## 2014-11-06 DIAGNOSIS — Z0000 Encounter for general adult medical examination without abnormal findings: Secondary | ICD-10-CM

## 2014-11-06 DIAGNOSIS — R5383 Other fatigue: Secondary | ICD-10-CM

## 2014-11-06 DIAGNOSIS — G4733 Obstructive sleep apnea (adult) (pediatric): Secondary | ICD-10-CM | POA: Diagnosis not present

## 2014-11-06 DIAGNOSIS — I10 Essential (primary) hypertension: Secondary | ICD-10-CM

## 2014-11-06 MED ORDER — ARMODAFINIL 150 MG PO TABS: 150.0000 mg | ORAL_TABLET | Freq: Every day | ORAL | Status: DC

## 2014-11-06 MED ORDER — RABEPRAZOLE SODIUM 20 MG PO TBEC: 20.0000 mg | DELAYED_RELEASE_TABLET | Freq: Every day | ORAL | Status: DC

## 2014-11-06 MED ORDER — TRAMADOL HCL 50 MG PO TABS: 50.0000 mg | ORAL_TABLET | Freq: Two times a day (BID) | ORAL | Status: DC | PRN

## 2014-11-06 MED ORDER — VITAMIN D 1000 UNITS PO TABS: 1000.0000 [IU] | ORAL_TABLET | Freq: Every day | ORAL | Status: DC

## 2014-11-06 MED ORDER — IBUPROFEN 800 MG PO TABS: 800.0000 mg | ORAL_TABLET | Freq: Four times a day (QID) | ORAL | Status: DC | PRN

## 2014-11-06 NOTE — Assessment & Plan Note (Signed)
Dr Onnie Graham Stretch Ibuprofen, Tramadol prn  Potential benefits of a long term opioids use as well as potential risks (i.e. addiction risk, apnea etc) and complications (i.e. Somnolence, constipation and others) were explained to the patient and were aknowledged.

## 2014-11-06 NOTE — Assessment & Plan Note (Signed)
On Losartan HCT 

## 2014-11-06 NOTE — Assessment & Plan Note (Signed)
Mild. Pt state sleeping well w/o CPAP

## 2014-11-06 NOTE — Progress Notes (Signed)
Pre visit review using our clinic review tool, if applicable. No additional management support is needed unless otherwise documented below in the visit note. 

## 2014-11-06 NOTE — Progress Notes (Signed)
Subjective:     HPI   C/o R shoulder pain x few weeks 6 out of 10 pain S/p L shoulder pain - was in PT, h/o rotator cuff repair  Working 3d shift. C/o fatigue all the time...  F/u mild OSA - he sleeps on his stomach all night... F/u HTN, chronic left shoulder pain, ED  BP Readings from Last 3 Encounters:  11/06/14 118/90  08/25/14 142/106  07/25/14 112/88     Review of Systems  Constitutional: Positive for fatigue. Negative for appetite change and unexpected weight change.  HENT: Negative for congestion, nosebleeds, sneezing, sore throat and trouble swallowing.   Eyes: Negative for itching and visual disturbance.  Respiratory: Negative for cough.   Cardiovascular: Negative for chest pain, palpitations and leg swelling.  Gastrointestinal: Negative for nausea, diarrhea, blood in stool and abdominal distention.  Genitourinary: Negative for frequency and hematuria.  Musculoskeletal: Negative for back pain, joint swelling, gait problem and neck pain.  Skin: Negative for rash.  Neurological: Negative for dizziness, tremors, speech difficulty and weakness.  Psychiatric/Behavioral: Negative for sleep disturbance, dysphoric mood and agitation. The patient is not nervous/anxious.        Objective:   Physical Exam  Constitutional: He is oriented to person, place, and time. He appears well-developed. No distress.  NAD  HENT:  Mouth/Throat: Oropharynx is clear and moist.  Eyes: Conjunctivae are normal. Pupils are equal, round, and reactive to light.  Neck: Normal range of motion. No JVD present. No thyromegaly present.  Cardiovascular: Normal rate, regular rhythm, normal heart sounds and intact distal pulses.  Exam reveals no gallop and no friction rub.   No murmur heard. Pulmonary/Chest: Effort normal and breath sounds normal. No respiratory distress. He has no wheezes. He has no rales. He exhibits no tenderness.  Abdominal: Soft. Bowel sounds are normal. He exhibits no distension  and no mass. There is no tenderness. There is no rebound and no guarding.  Musculoskeletal: Normal range of motion. He exhibits no edema or tenderness.  Lymphadenopathy:    He has no cervical adenopathy.  Neurological: He is alert and oriented to person, place, and time. He has normal reflexes. No cranial nerve deficit. He exhibits normal muscle tone. He displays a negative Romberg sign. Coordination and gait normal.  Skin: Skin is warm and dry. No rash noted.  Psychiatric: He has a normal mood and affect. His behavior is normal. Judgment and thought content normal.  R>L shoulder is tender w/ROM        Assessment:         Plan:

## 2014-11-06 NOTE — Assessment & Plan Note (Addendum)
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

## 2014-12-03 ENCOUNTER — Other Ambulatory Visit (INDEPENDENT_AMBULATORY_CARE_PROVIDER_SITE_OTHER): Payer: Commercial Managed Care - HMO

## 2014-12-03 DIAGNOSIS — Z0000 Encounter for general adult medical examination without abnormal findings: Secondary | ICD-10-CM

## 2014-12-03 DIAGNOSIS — R5383 Other fatigue: Secondary | ICD-10-CM

## 2014-12-03 DIAGNOSIS — M25511 Pain in right shoulder: Secondary | ICD-10-CM

## 2014-12-03 LAB — CBC WITH DIFFERENTIAL/PLATELET
BASOS PCT: 0.9 % (ref 0.0–3.0)
Basophils Absolute: 0 10*3/uL (ref 0.0–0.1)
EOS PCT: 1.8 % (ref 0.0–5.0)
Eosinophils Absolute: 0.1 10*3/uL (ref 0.0–0.7)
HCT: 45.4 % (ref 39.0–52.0)
HEMOGLOBIN: 15.1 g/dL (ref 13.0–17.0)
Lymphocytes Relative: 44 % (ref 12.0–46.0)
Lymphs Abs: 2 10*3/uL (ref 0.7–4.0)
MCHC: 33.3 g/dL (ref 30.0–36.0)
MCV: 88.8 fl (ref 78.0–100.0)
MONO ABS: 0.5 10*3/uL (ref 0.1–1.0)
MONOS PCT: 10.3 % (ref 3.0–12.0)
NEUTROS ABS: 2 10*3/uL (ref 1.4–7.7)
NEUTROS PCT: 43 % (ref 43.0–77.0)
PLATELETS: 303 10*3/uL (ref 150.0–400.0)
RBC: 5.12 Mil/uL (ref 4.22–5.81)
RDW: 13.5 % (ref 11.5–15.5)
WBC: 4.6 10*3/uL (ref 4.0–10.5)

## 2014-12-03 LAB — URINALYSIS, ROUTINE W REFLEX MICROSCOPIC
Bilirubin Urine: NEGATIVE
Hgb urine dipstick: NEGATIVE
Ketones, ur: NEGATIVE
LEUKOCYTES UA: NEGATIVE
NITRITE: NEGATIVE
Specific Gravity, Urine: >=1.03 — AB (ref 1.000–1.030)
Total Protein, Urine: 30 — AB
UROBILINOGEN UA: 0.2 (ref 0.0–1.0)
Urine Glucose: NEGATIVE
pH: 6 (ref 5.0–8.0)

## 2014-12-03 LAB — BASIC METABOLIC PANEL
BUN: 13 mg/dL (ref 6–23)
CALCIUM: 9.6 mg/dL (ref 8.4–10.5)
CHLORIDE: 103 meq/L (ref 96–112)
CO2: 26 meq/L (ref 19–32)
Creatinine, Ser: 1.21 mg/dL (ref 0.40–1.50)
GFR: 81.87 mL/min (ref >60.00)
GLUCOSE: 97 mg/dL (ref 70–99)
Potassium: 3.7 mEq/L (ref 3.5–5.1)
SODIUM: 137 meq/L (ref 135–145)

## 2014-12-03 LAB — VITAMIN B12: Vitamin B-12: 232 pg/mL (ref 211–911)

## 2014-12-03 LAB — HEPATIC FUNCTION PANEL
ALT: 10 U/L (ref 0–53)
AST: 15 U/L (ref 0–37)
Albumin: 4.3 g/dL (ref 3.5–5.2)
Alkaline Phosphatase: 62 U/L (ref 39–117)
BILIRUBIN TOTAL: 0.6 mg/dL (ref 0.2–1.2)
Bilirubin, Direct: 0.1 mg/dL (ref 0.0–0.3)
Total Protein: 7.6 g/dL (ref 6.0–8.3)

## 2014-12-03 LAB — TSH: TSH: 1.94 u[IU]/mL (ref 0.35–4.50)

## 2014-12-03 LAB — VITAMIN D 25 HYDROXY (VIT D DEFICIENCY, FRACTURES): VITD: 8.69 ng/mL — ABNORMAL LOW (ref 30.00–100.00)

## 2014-12-03 LAB — LIPID PANEL
CHOLESTEROL: 172 mg/dL (ref 0–200)
HDL: 43.6 mg/dL (ref >39.00)
LDL CALC: 113 mg/dL — AB (ref 0–99)
NONHDL: 128.4
TRIGLYCERIDES: 75 mg/dL (ref 0.0–149.0)
Total CHOL/HDL Ratio: 4
VLDL: 15 mg/dL (ref 0.0–40.0)

## 2014-12-03 LAB — PSA: PSA: 1.8 ng/mL (ref 0.10–4.00)

## 2014-12-03 LAB — TESTOSTERONE: TESTOSTERONE: 371.22 ng/dL (ref 300.00–890.00)

## 2014-12-04 ENCOUNTER — Encounter: Payer: 59 | Admitting: Internal Medicine

## 2014-12-09 ENCOUNTER — Encounter: Payer: Commercial Managed Care - HMO | Admitting: Internal Medicine

## 2014-12-23 ENCOUNTER — Telehealth: Payer: Self-pay | Admitting: Internal Medicine

## 2014-12-23 MED ORDER — LOSARTAN POTASSIUM-HCTZ 100-25 MG PO TABS: 1.0000 | ORAL_TABLET | Freq: Every day | ORAL | Status: DC

## 2014-12-23 NOTE — Telephone Encounter (Signed)
Done. See meds.  

## 2014-12-23 NOTE — Telephone Encounter (Signed)
Patient requesting that his blood pressure medicine needs to be brand name only. Not generic. He didn't know the name of it.  [facility] [address].

## 2015-04-21 ENCOUNTER — Encounter: Payer: Self-pay | Admitting: Internal Medicine

## 2015-04-21 ENCOUNTER — Ambulatory Visit (INDEPENDENT_AMBULATORY_CARE_PROVIDER_SITE_OTHER): Payer: Commercial Managed Care - HMO | Admitting: Internal Medicine

## 2015-04-21 VITALS — BP 118/80 | HR 111 | Wt 212.0 lb

## 2015-04-21 DIAGNOSIS — R519 Headache, unspecified: Secondary | ICD-10-CM

## 2015-04-21 DIAGNOSIS — G4733 Obstructive sleep apnea (adult) (pediatric): Secondary | ICD-10-CM

## 2015-04-21 DIAGNOSIS — E559 Vitamin D deficiency, unspecified: Secondary | ICD-10-CM | POA: Diagnosis not present

## 2015-04-21 DIAGNOSIS — E538 Deficiency of other specified B group vitamins: Secondary | ICD-10-CM | POA: Insufficient documentation

## 2015-04-21 DIAGNOSIS — R51 Headache: Secondary | ICD-10-CM

## 2015-04-21 DIAGNOSIS — R5383 Other fatigue: Secondary | ICD-10-CM

## 2015-04-21 DIAGNOSIS — H6123 Impacted cerumen, bilateral: Secondary | ICD-10-CM

## 2015-04-21 DIAGNOSIS — R350 Frequency of micturition: Secondary | ICD-10-CM

## 2015-04-21 DIAGNOSIS — H6120 Impacted cerumen, unspecified ear: Secondary | ICD-10-CM | POA: Insufficient documentation

## 2015-04-21 DIAGNOSIS — I10 Essential (primary) hypertension: Secondary | ICD-10-CM

## 2015-04-21 LAB — GLUCOSE, POCT (MANUAL RESULT ENTRY): POC GLUCOSE: 120 mg/dL — AB (ref 70–99)

## 2015-04-21 MED ORDER — VITAMIN B-12 1000 MCG SL SUBL: 1.0000 | SUBLINGUAL_TABLET | Freq: Every day | SUBLINGUAL | Status: DC

## 2015-04-21 MED ORDER — CYANOCOBALAMIN 1000 MCG/ML IJ SOLN
1000.0000 ug | Freq: Once | INTRAMUSCULAR | Status: AC
  Administered 2015-04-21: 1000 ug via INTRAMUSCULAR

## 2015-04-21 MED ORDER — TRAZODONE HCL 50 MG PO TABS: 50.0000 mg | ORAL_TABLET | Freq: Every evening | ORAL | Status: DC | PRN

## 2015-04-21 MED ORDER — VITAMIN D3 50 MCG (2000 UT) PO CAPS: 2000.0000 [IU] | ORAL_CAPSULE | Freq: Every day | ORAL | Status: DC

## 2015-04-21 MED ORDER — ARMODAFINIL 150 MG PO TABS
150.0000 mg | ORAL_TABLET | Freq: Every day | ORAL | Status: DC
Start: 2015-04-21 — End: 2018-04-07

## 2015-04-21 MED ORDER — SUMATRIPTAN SUCCINATE 100 MG PO TABS: 100.0000 mg | ORAL_TABLET | Freq: Once | ORAL | Status: DC

## 2015-04-21 MED ORDER — KETOCONAZOLE 2 % EX SHAM: 1.0000 "application " | MEDICATED_SHAMPOO | CUTANEOUS | Status: DC

## 2015-04-21 MED ORDER — DEXLANSOPRAZOLE 60 MG PO CPDR: 60.0000 mg | DELAYED_RELEASE_CAPSULE | Freq: Every day | ORAL | Status: DC

## 2015-04-21 MED ORDER — ERGOCALCIFEROL 1.25 MG (50000 UT) PO CAPS: 50000.0000 [IU] | ORAL_CAPSULE | ORAL | Status: DC

## 2015-04-21 NOTE — Patient Instructions (Signed)
Use a contour pillow You have a low Vitamin D levels - please, start Vit D prescription 50000 iu weekly (Rx emailed to your pharmacy) followed by over-the-counter Vit D 1000 iu daily.

## 2015-04-21 NOTE — Assessment & Plan Note (Signed)
F/u w/Dr Sood 

## 2015-04-21 NOTE — Assessment & Plan Note (Signed)
11/16 ?migraines CT if not better Rest Imitrex prn

## 2015-04-21 NOTE — Assessment & Plan Note (Signed)
Will irrigate 

## 2015-04-21 NOTE — Assessment & Plan Note (Signed)
On Losartan HCT 

## 2015-04-21 NOTE — Progress Notes (Signed)
Subjective:  Patient ID: Corey Oliver, male    DOB: 1965-06-05  Age: 49 y.o. MRN: [id number]  CC: No chief complaint on file.   HPI Corey Oliver presents for HAs this week. C/o frequent urination. C/o fatigue - mild OSA C/o ear congestion and wax Pt is working 12 h x 6d per week.   Outpatient Prescriptions Prior to Visit  Medication Sig Dispense Refill  . Albuterol Sulfate (PROAIR RESPICLICK) 123XX123 (90 BASE) MCG/ACT AEPB Inhale 1-2 puffs into the lungs 4 (four) times daily as needed. 1 each 2  . fluticasone (FLONASE) 50 MCG/ACT nasal spray Place 2 sprays into the nose daily. 16 g 10  . hydrocortisone (ANUSOL-HC) 25 MG suppository PLACE 1 SUPPOSITORY RECTALLY EVERY 12 HOURS. 12 suppository 0  . hydrocortisone-pramoxine (ANALPRAM-HC) 2.5-1 % rectal cream Place 1 application rectally 3 (three) times daily. 30 g 0  . losartan-hydrochlorothiazide (HYZAAR) 100-25 MG per tablet Take 1 tablet by mouth daily. 90 tablet 3  . Armodafinil (NUVIGIL) 150 MG tablet Take 1 tablet (150 mg total) by mouth daily. 30 tablet 2  . cholecalciferol (VITAMIN D) 1000 UNITS tablet Take 1 tablet (1,000 Units total) by mouth daily. 100 tablet 3  . ibuprofen (ADVIL,MOTRIN) 800 MG tablet Take 1 tablet (800 mg total) by mouth every 6 (six) hours as needed. 100 tablet 1  . Phenyleph-Promethazine-Cod (PROMETHAZINE VC/CODEINE) 5-6.25-10 MG/5ML SYRP Take 77mL every four hours as needed for cough. 180 mL 0  . traMADol (ULTRAM) 50 MG tablet Take 1-2 tablets (50-100 mg total) by mouth 2 (two) times daily as needed. For severe pain 60 tablet 1  . RABEprazole (ACIPHEX) 20 MG tablet Take 1 tablet (20 mg total) by mouth daily. (Patient not taking: Reported on 04/21/2015) 90 tablet 3   No facility-administered medications prior to visit.    ROS Review of Systems  Constitutional: Negative for appetite change, fatigue and unexpected weight change.  HENT: Negative for congestion, nosebleeds, sneezing, sore throat and trouble  swallowing.   Eyes: Negative for itching and visual disturbance.  Respiratory: Negative for cough.   Cardiovascular: Negative for chest pain, palpitations and leg swelling.  Gastrointestinal: Negative for nausea, diarrhea, blood in stool and abdominal distention.  Genitourinary: Negative for frequency and hematuria.  Musculoskeletal: Negative for back pain, joint swelling, gait problem and neck pain.  Skin: Negative for rash.  Neurological: Negative for dizziness, tremors, speech difficulty and weakness.  Psychiatric/Behavioral: Negative for sleep disturbance, dysphoric mood and agitation. The patient is not nervous/anxious.   B earwax  Objective:  BP 118/80 mmHg  Pulse 111  Wt 212 lb (96.163 kg)  SpO2 92%  BP Readings from Last 3 Encounters:  04/21/15 118/80  11/06/14 118/90  08/25/14 142/106    Wt Readings from Last 3 Encounters:  04/21/15 212 lb (96.163 kg)  11/06/14 212 lb (96.163 kg)  08/25/14 210 lb (95.255 kg)    Physical Exam  Constitutional: He is oriented to person, place, and time. He appears well-developed. No distress.  NAD  HENT:  Mouth/Throat: Oropharynx is clear and moist.  Eyes: Conjunctivae are normal. Pupils are equal, round, and reactive to light.  Neck: Normal range of motion. No JVD present. No thyromegaly present.  Cardiovascular: Normal rate, regular rhythm, normal heart sounds and intact distal pulses.  Exam reveals no gallop and no friction rub.   No murmur heard. Pulmonary/Chest: Effort normal and breath sounds normal. No respiratory distress. He has no wheezes. He has no rales. He exhibits no tenderness.  Abdominal: Soft. Bowel sounds are normal. He exhibits no distension and no mass. There is no tenderness. There is no rebound and no guarding.  Musculoskeletal: Normal range of motion. He exhibits no edema or tenderness.  Lymphadenopathy:    He has no cervical adenopathy.  Neurological: He is alert and oriented to person, place, and time. He has  normal reflexes. No cranial nerve deficit. He exhibits normal muscle tone. He displays a negative Romberg sign. Coordination and gait normal.  Skin: Skin is warm and dry. No rash noted.  Psychiatric: He has a normal mood and affect. His behavior is normal. Judgment and thought content normal.  B earwax  Lab Results  Component Value Date   WBC 4.6 12/03/2014   HGB 15.1 12/03/2014   HCT 45.4 12/03/2014   PLT 303.0 12/03/2014   GLUCOSE 97 12/03/2014   CHOL 172 12/03/2014   TRIG 75.0 12/03/2014   HDL 43.60 12/03/2014   LDLCALC 113* 12/03/2014   ALT 10 12/03/2014   AST 15 12/03/2014   NA 137 12/03/2014   K 3.7 12/03/2014   CL 103 12/03/2014   CREATININE 1.21 12/03/2014   BUN 13 12/03/2014   CO2 26 12/03/2014   TSH 1.94 12/03/2014   PSA 1.80 12/03/2014    Procedure Note :     Procedure :  Ear irrigation   Indication:  Cerumen impaction B   Risks, including pain, dizziness, eardrum perforation, bleeding, infection and others as well as benefits were explained to the patient in detail. Verbal consent was obtained and the patient agreed to proceed.    We used "The Elephant Ear Irrigation Device" filled with lukewarm water for irrigation. A large amount wax was recovered. Procedure has also required manual wax removal with an ear loop.   Tolerated well. Complications: None.   Postprocedure instructions :  Call if problems.   No results found.  Assessment & Plan:   Diagnoses and all orders for this visit:  Vitamin D deficiency  B12 deficiency -     cyanocobalamin ((VITAMIN B-12)) injection 1,000 mcg; Inject 1 mL (1,000 mcg total) into the muscle once.  Essential hypertension  Obstructive sleep apnea  Urinary frequency -     POCT glucose (manual entry)  Cerumen impaction, bilateral  Vitamin B12 deficiency  Other fatigue  Nonintractable headache, unspecified chronicity pattern, unspecified headache type  Other orders -     ketoconazole (NIZORAL) 2 % shampoo;  Apply 1 application topically 2 (two) times a week. As directed -     dexlansoprazole (DEXILANT) 60 MG capsule; Take 1 capsule (60 mg total) by mouth daily. -     Armodafinil (NUVIGIL) 150 MG tablet; Take 1 tablet (150 mg total) by mouth daily. -     traZODone (DESYREL) 50 MG tablet; Take 1 tablet (50 mg total) by mouth at bedtime as needed for sleep. -     ergocalciferol (VITAMIN D2) 50000 UNITS capsule; Take 1 capsule (50,000 Units total) by mouth once a week. -     Cholecalciferol (VITAMIN D3) 2000 UNITS capsule; Take 1 capsule (2,000 Units total) by mouth daily. -     Cyanocobalamin (VITAMIN B-12) 1000 MCG SUBL; Place 1 tablet (1,000 mcg total) under the tongue daily. -     SUMAtriptan (IMITREX) 100 MG tablet; Take 1 tablet (100 mg total) by mouth once. May repeat in 2 hours if headache persists or recurs.  I have discontinued Mr. Tee Phenyleph-Promethazine-Cod, ibuprofen, RABEprazole, traMADol, and cholecalciferol. I have also changed his ketoconazole,  dexlansoprazole, and traZODone. Additionally, I am having him start on ergocalciferol, Vitamin D3, Vitamin B-12, and SUMAtriptan. Lastly, I am having him maintain his fluticasone, hydrocortisone, hydrocortisone-pramoxine, Albuterol Sulfate, losartan-hydrochlorothiazide, and Armodafinil. We administered cyanocobalamin.  Meds ordered this encounter  Medications  . DISCONTD: traZODone (DESYREL) 50 MG tablet    Sig: at bedtime.  [name] DISCONTD: dexlansoprazole (DEXILANT) 60 MG capsule    Sig: Take 60 mg by mouth daily.  [name] DISCONTD: ketoconazole (NIZORAL) 2 % shampoo    Sig: Apply 1 application topically 2 (two) times a week.  [name] ketoconazole (NIZORAL) 2 % shampoo    Sig: Apply 1 application topically 2 (two) times a week. As directed    Dispense:  120 mL    Refill:  5  . dexlansoprazole (DEXILANT) 60 MG capsule    Sig: Take 1 capsule (60 mg total) by mouth daily.    Dispense:  30 capsule    Refill:  11  . Armodafinil (NUVIGIL) 150 MG tablet      Sig: Take 1 tablet (150 mg total) by mouth daily.    Dispense:  30 tablet    Refill:  5  . traZODone (DESYREL) 50 MG tablet    Sig: Take 1 tablet (50 mg total) by mouth at bedtime as needed for sleep.    Dispense:  30 tablet    Refill:  5  . ergocalciferol (VITAMIN D2) 50000 UNITS capsule    Sig: Take 1 capsule (50,000 Units total) by mouth once a week.    Dispense:  6 capsule    Refill:  0  . Cholecalciferol (VITAMIN D3) 2000 UNITS capsule    Sig: Take 1 capsule (2,000 Units total) by mouth daily.    Dispense:  100 capsule    Refill:  3  . Cyanocobalamin (VITAMIN B-12) 1000 MCG SUBL    Sig: Place 1 tablet (1,000 mcg total) under the tongue daily.    Dispense:  100 tablet    Refill:  3  . SUMAtriptan (IMITREX) 100 MG tablet    Sig: Take 1 tablet (100 mg total) by mouth once. May repeat in 2 hours if headache persists or recurs.    Dispense:  12 tablet    Refill:  3  . cyanocobalamin ((VITAMIN B-12)) injection 1,000 mcg    Sig:      Follow-up: Return in about 6 weeks (around 06/02/2015).  Walker Kehr, MD

## 2015-04-21 NOTE — Assessment & Plan Note (Signed)
start Vit D prescription 50000 iu weekly (Rx emailed to your pharmacy) followed by over-the-counter Vit D 1000 iu daily.  

## 2015-04-21 NOTE — Progress Notes (Signed)
Pre visit review using our clinic review tool, if applicable. No additional management support is needed unless otherwise documented below in the visit note. 

## 2015-04-21 NOTE — Assessment & Plan Note (Signed)
F/u w/Dr Halford Chessman Contour pillow Nuvigil  Rx  Potential benefits of a long term Nuvigil  use as well as potential risks  and complications were explained to the patient and were aknowledged.

## 2015-05-13 ENCOUNTER — Telehealth: Payer: Self-pay | Admitting: Internal Medicine

## 2015-05-13 NOTE — Telephone Encounter (Signed)
Pt called state that he received a bill from our office for form that we fill out back in April. Pt stated he paid for it already but he doesn't have the recipe for it. Please give him a call back

## 2015-05-14 NOTE — Telephone Encounter (Signed)
Called and spoke to him. We have not collected up front for paperwork. He will come and pay

## 2015-07-05 ENCOUNTER — Ambulatory Visit: Payer: Commercial Managed Care - HMO | Admitting: Internal Medicine

## 2015-07-11 ENCOUNTER — Ambulatory Visit: Payer: Commercial Managed Care - HMO | Admitting: Pulmonary Disease

## 2015-11-07 ENCOUNTER — Ambulatory Visit (INDEPENDENT_AMBULATORY_CARE_PROVIDER_SITE_OTHER)
Admission: RE | Admit: 2015-11-07 | Discharge: 2015-11-07 | Disposition: A | Discharge status: Home / Self Care | Payer: Commercial Managed Care - HMO | Source: Ambulatory Visit | Attending: Internal Medicine | Admitting: Internal Medicine

## 2015-11-07 ENCOUNTER — Ambulatory Visit (INDEPENDENT_AMBULATORY_CARE_PROVIDER_SITE_OTHER): Payer: Commercial Managed Care - HMO | Admitting: Internal Medicine

## 2015-11-07 ENCOUNTER — Encounter: Payer: Self-pay | Admitting: Internal Medicine

## 2015-11-07 VITALS — BP 144/90 | HR 97 | Ht 66.0 in | Wt 221.0 lb

## 2015-11-07 DIAGNOSIS — G4733 Obstructive sleep apnea (adult) (pediatric): Secondary | ICD-10-CM

## 2015-11-07 DIAGNOSIS — Z0000 Encounter for general adult medical examination without abnormal findings: Secondary | ICD-10-CM

## 2015-11-07 DIAGNOSIS — R062 Wheezing: Secondary | ICD-10-CM | POA: Diagnosis not present

## 2015-11-07 DIAGNOSIS — E559 Vitamin D deficiency, unspecified: Secondary | ICD-10-CM

## 2015-11-07 DIAGNOSIS — E538 Deficiency of other specified B group vitamins: Secondary | ICD-10-CM

## 2015-11-07 DIAGNOSIS — R0789 Other chest pain: Secondary | ICD-10-CM | POA: Diagnosis not present

## 2015-11-07 DIAGNOSIS — I10 Essential (primary) hypertension: Secondary | ICD-10-CM

## 2015-11-07 DIAGNOSIS — N529 Male erectile dysfunction, unspecified: Secondary | ICD-10-CM | POA: Diagnosis not present

## 2015-11-07 MED ORDER — SILDENAFIL CITRATE 100 MG PO TABS: 100.0000 mg | ORAL_TABLET | ORAL | Status: DC | PRN

## 2015-11-07 MED ORDER — CYANOCOBALAMIN 1000 MCG/ML IJ SOLN
1000.0000 ug | Freq: Once | INTRAMUSCULAR | Status: AC
  Administered 2015-11-07: 1000 ug via INTRAMUSCULAR

## 2015-11-07 MED ORDER — KETOCONAZOLE 2 % EX SHAM: 1.0000 "application " | MEDICATED_SHAMPOO | CUTANEOUS | Status: DC

## 2015-11-07 MED ORDER — TRAZODONE HCL 50 MG PO TABS: 50.0000 mg | ORAL_TABLET | Freq: Every evening | ORAL | Status: DC | PRN

## 2015-11-07 NOTE — Assessment & Plan Note (Signed)
Dr Gaynelle Arabian Viagra prn

## 2015-11-07 NOTE — Assessment & Plan Note (Addendum)
On Losartan HCT - re-start Risks associated with treatment noncompliance were discussed. Compliance was encouraged.

## 2015-11-07 NOTE — Assessment & Plan Note (Signed)
EKG Treat GERD CXR Stress test Baby ASA

## 2015-11-07 NOTE — Assessment & Plan Note (Signed)
We discussed age appropriate health related issues, including available/recomended screening tests and vaccinations. We discussed a need for adhering to healthy diet and exercise. Labs/EKG were reviewed/ordered. All questions were answered.  colonsocopy Feb '13 - normal study - due in 2023 Immunization: decline flu shot (Oct '13); reports he has had tetanus in the last 10 years.

## 2015-11-07 NOTE — Progress Notes (Signed)
Subjective:  Patient ID: Corey Oliver, male    DOB: 1966-05-24  Age: 50 y.o. MRN: [id number]  CC: Annual Exam   HPI HRISHIKESH Oliver presents for a well exam C/o ED F/u GERD, HTN, OSA C/o chest pressure and wheezing at times F/u Vit D and B 12 def  Outpatient Prescriptions Prior to Visit  Medication Sig Dispense Refill  . Albuterol Sulfate (PROAIR RESPICLICK) 123XX123 (90 BASE) MCG/ACT AEPB Inhale 1-2 puffs into the lungs 4 (four) times daily as needed. 1 each 2  . Armodafinil (NUVIGIL) 150 MG tablet Take 1 tablet (150 mg total) by mouth daily. 30 tablet 5  . Cholecalciferol (VITAMIN D3) 2000 UNITS capsule Take 1 capsule (2,000 Units total) by mouth daily. 100 capsule 3  . Cyanocobalamin (VITAMIN B-12) 1000 MCG SUBL Place 1 tablet (1,000 mcg total) under the tongue daily. 100 tablet 3  . dexlansoprazole (DEXILANT) 60 MG capsule Take 1 capsule (60 mg total) by mouth daily. 30 capsule 11  . ergocalciferol (VITAMIN D2) 50000 UNITS capsule Take 1 capsule (50,000 Units total) by mouth once a week. 6 capsule 0  . fluticasone (FLONASE) 50 MCG/ACT nasal spray Place 2 sprays into the nose daily. 16 g 10  . hydrocortisone (ANUSOL-HC) 25 MG suppository PLACE 1 SUPPOSITORY RECTALLY EVERY 12 HOURS. 12 suppository 0  . hydrocortisone-pramoxine (ANALPRAM-HC) 2.5-1 % rectal cream Place 1 application rectally 3 (three) times daily. 30 g 0  . ketoconazole (NIZORAL) 2 % shampoo Apply 1 application topically 2 (two) times a week. As directed 120 mL 5  . losartan-hydrochlorothiazide (HYZAAR) 100-25 MG per tablet Take 1 tablet by mouth daily. 90 tablet 3  . SUMAtriptan (IMITREX) 100 MG tablet Take 1 tablet (100 mg total) by mouth once. May repeat in 2 hours if headache persists or recurs. 12 tablet 3  . traZODone (DESYREL) 50 MG tablet Take 1 tablet (50 mg total) by mouth at bedtime as needed for sleep. 30 tablet 5   No facility-administered medications prior to visit.    ROS Review of Systems    Constitutional: Negative for appetite change, fatigue and unexpected weight change.  HENT: Negative for congestion, nosebleeds, sneezing, sore throat and trouble swallowing.   Eyes: Negative for itching and visual disturbance.  Respiratory: Positive for chest tightness and wheezing. Negative for cough.   Cardiovascular: Negative for chest pain, palpitations and leg swelling.  Gastrointestinal: Negative for nausea, diarrhea, blood in stool and abdominal distention.  Genitourinary: Negative for frequency and hematuria.  Musculoskeletal: Negative for back pain, joint swelling, gait problem and neck pain.  Skin: Negative for rash.  Neurological: Negative for dizziness, tremors, speech difficulty and weakness.  Psychiatric/Behavioral: Negative for sleep disturbance, dysphoric mood and agitation. The patient is not nervous/anxious.     Objective:  BP 144/90 mmHg  Pulse 97  Ht 5\' 6"  (1.676 m)  Wt 221 lb (100.245 kg)  BMI 35.69 kg/m2  SpO2 97%  BP Readings from Last 3 Encounters:  11/07/15 144/90  04/21/15 118/80  11/06/14 118/90    Wt Readings from Last 3 Encounters:  11/07/15 221 lb (100.245 kg)  04/21/15 212 lb (96.163 kg)  11/06/14 212 lb (96.163 kg)    Physical Exam  Constitutional: He is oriented to person, place, and time. He appears well-developed and well-nourished. No distress.  HENT:  Head: Normocephalic and atraumatic.  Right Ear: External ear normal.  Left Ear: External ear normal.  Nose: Nose normal.  Mouth/Throat: Oropharynx is clear and moist. No oropharyngeal exudate.  Eyes: Conjunctivae and EOM are normal. Pupils are equal, round, and reactive to light. Right eye exhibits no discharge. Left eye exhibits no discharge. No scleral icterus.  Neck: Normal range of motion. Neck supple. No JVD present. No tracheal deviation present. No thyromegaly present.  Cardiovascular: Normal rate, regular rhythm, normal heart sounds and intact distal pulses.  Exam reveals no  gallop and no friction rub.   No murmur heard. Pulmonary/Chest: Effort normal and breath sounds normal. No stridor. No respiratory distress. He has no wheezes. He has no rales. He exhibits no tenderness.  Abdominal: Soft. Bowel sounds are normal. He exhibits no distension and no mass. There is no tenderness. There is no rebound and no guarding.  Musculoskeletal: Normal range of motion. He exhibits no edema or tenderness.  Lymphadenopathy:    He has no cervical adenopathy.  Neurological: He is alert and oriented to person, place, and time. He has normal reflexes. No cranial nerve deficit. He exhibits normal muscle tone. Coordination normal.  Skin: Skin is warm and dry. No rash noted. He is not diaphoretic. No erythema. No pallor.  Psychiatric: He has a normal mood and affect. His behavior is normal. Judgment and thought content normal.  Rectal - per Dr Gaynelle Arabian 2 wks ago   Procedure: EKG Indication: chest pressure Impression: NSR. No acute changes.   Lab Results  Component Value Date   WBC 4.6 12/03/2014   HGB 15.1 12/03/2014   HCT 45.4 12/03/2014   PLT 303.0 12/03/2014   GLUCOSE 97 12/03/2014   CHOL 172 12/03/2014   TRIG 75.0 12/03/2014   HDL 43.60 12/03/2014   LDLCALC 113* 12/03/2014   ALT 10 12/03/2014   AST 15 12/03/2014   NA 137 12/03/2014   K 3.7 12/03/2014   CL 103 12/03/2014   CREATININE 1.21 12/03/2014   BUN 13 12/03/2014   CO2 26 12/03/2014   TSH 1.94 12/03/2014   PSA 1.80 12/03/2014    No results found.  Assessment & Plan:   There are no diagnoses linked to this encounter. I am having Mr. Faux maintain his fluticasone, hydrocortisone, hydrocortisone-pramoxine, Albuterol Sulfate, losartan-hydrochlorothiazide, ketoconazole, dexlansoprazole, Armodafinil, traZODone, ergocalciferol, Vitamin D3, Vitamin B-12, and SUMAtriptan.  No orders of the defined types were placed in this encounter.     Follow-up: No Follow-up on file.  Walker Kehr, MD

## 2015-11-07 NOTE — Progress Notes (Signed)
Pre visit review using our clinic review tool, if applicable. No additional management support is needed unless otherwise documented below in the visit note. 

## 2015-11-07 NOTE — Assessment & Plan Note (Signed)
Risks associated with treatment noncompliance were discussed. Compliance was encouraged. B12 inj

## 2015-11-07 NOTE — Assessment & Plan Note (Signed)
Risks associated with treatment noncompliance were discussed. Compliance was encouraged. 

## 2015-11-07 NOTE — Assessment & Plan Note (Signed)
  Treat GERD CXR

## 2015-11-07 NOTE — Assessment & Plan Note (Signed)
Will sch ROV

## 2015-11-21 ENCOUNTER — Encounter: Payer: Self-pay | Admitting: Internal Medicine

## 2015-12-03 ENCOUNTER — Other Ambulatory Visit (INDEPENDENT_AMBULATORY_CARE_PROVIDER_SITE_OTHER): Payer: Commercial Managed Care - HMO

## 2015-12-03 ENCOUNTER — Other Ambulatory Visit: Payer: Self-pay | Admitting: Internal Medicine

## 2015-12-03 DIAGNOSIS — Z0000 Encounter for general adult medical examination without abnormal findings: Secondary | ICD-10-CM | POA: Diagnosis not present

## 2015-12-03 DIAGNOSIS — E559 Vitamin D deficiency, unspecified: Secondary | ICD-10-CM | POA: Diagnosis not present

## 2015-12-03 LAB — BASIC METABOLIC PANEL
BUN: 12 mg/dL (ref 6–23)
CALCIUM: 9.9 mg/dL (ref 8.4–10.5)
CO2: 27 meq/L (ref 19–32)
CREATININE: 1.09 mg/dL (ref 0.40–1.50)
Chloride: 101 mEq/L (ref 96–112)
GFR: 91.98 mL/min (ref >60.00)
GLUCOSE: 90 mg/dL (ref 70–99)
Potassium: 4.1 mEq/L (ref 3.5–5.1)
Sodium: 137 mEq/L (ref 135–145)

## 2015-12-03 LAB — LIPID PANEL
Cholesterol: 187 mg/dL (ref 0–200)
HDL: 49.1 mg/dL (ref >39.00)
LDL Cholesterol: 128 mg/dL — ABNORMAL HIGH (ref 0–99)
NonHDL: 137.49
Total CHOL/HDL Ratio: 4
Triglycerides: 48 mg/dL (ref 0.0–149.0)
VLDL: 9.6 mg/dL (ref 0.0–40.0)

## 2015-12-03 LAB — CBC WITH DIFFERENTIAL/PLATELET
BASOS ABS: 0 10*3/uL (ref 0.0–0.1)
Basophils Relative: 0.9 % (ref 0.0–3.0)
EOS ABS: 0.1 10*3/uL (ref 0.0–0.7)
Eosinophils Relative: 2.7 % (ref 0.0–5.0)
HCT: 44.4 % (ref 39.0–52.0)
Hemoglobin: 15.3 g/dL (ref 13.0–17.0)
LYMPHS ABS: 1.8 10*3/uL (ref 0.7–4.0)
Lymphocytes Relative: 36.9 % (ref 12.0–46.0)
MCHC: 34.5 g/dL (ref 30.0–36.0)
MCV: 85.5 fl (ref 78.0–100.0)
Monocytes Absolute: 0.6 10*3/uL (ref 0.1–1.0)
Monocytes Relative: 11.2 % (ref 3.0–12.0)
NEUTROS ABS: 2.4 10*3/uL (ref 1.4–7.7)
NEUTROS PCT: 48.3 % (ref 43.0–77.0)
PLATELETS: 298 10*3/uL (ref 150.0–400.0)
RBC: 5.19 Mil/uL (ref 4.22–5.81)
RDW: 13.9 % (ref 11.5–15.5)
WBC: 4.9 10*3/uL (ref 4.0–10.5)

## 2015-12-03 LAB — PSA: PSA: 1.37 ng/mL (ref 0.10–4.00)

## 2015-12-03 LAB — TSH: TSH: 2.4 u[IU]/mL (ref 0.35–4.50)

## 2015-12-03 LAB — URINALYSIS
BILIRUBIN URINE: NEGATIVE
Hgb urine dipstick: NEGATIVE
KETONES UR: NEGATIVE
Leukocytes, UA: NEGATIVE
Nitrite: NEGATIVE
PH: 7 (ref 5.0–8.0)
SPECIFIC GRAVITY, URINE: 1.015 (ref 1.000–1.030)
Total Protein, Urine: NEGATIVE
URINE GLUCOSE: NEGATIVE
UROBILINOGEN UA: 0.2 (ref 0.0–1.0)

## 2015-12-03 LAB — HEPATIC FUNCTION PANEL
ALBUMIN: 4.4 g/dL (ref 3.5–5.2)
ALK PHOS: 57 U/L (ref 39–117)
ALT: 14 U/L (ref 0–53)
AST: 19 U/L (ref 0–37)
BILIRUBIN DIRECT: 0.2 mg/dL (ref 0.0–0.3)
BILIRUBIN TOTAL: 0.8 mg/dL (ref 0.2–1.2)
Total Protein: 7.6 g/dL (ref 6.0–8.3)

## 2015-12-03 LAB — VITAMIN D 25 HYDROXY (VIT D DEFICIENCY, FRACTURES): VITD: 16.47 ng/mL — AB (ref 30.00–100.00)

## 2015-12-03 MED ORDER — ERGOCALCIFEROL 1.25 MG (50000 UT) PO CAPS: 50000.0000 [IU] | ORAL_CAPSULE | ORAL | Status: DC

## 2015-12-26 ENCOUNTER — Encounter (HOSPITAL_COMMUNITY): Payer: Commercial Managed Care - HMO

## 2016-01-14 ENCOUNTER — Encounter (HOSPITAL_COMMUNITY): Payer: Self-pay | Admitting: Emergency Medicine

## 2016-01-14 ENCOUNTER — Ambulatory Visit (HOSPITAL_COMMUNITY)
Admission: EM | Admit: 2016-01-14 | Discharge: 2016-01-14 | Disposition: A | Discharge status: Home / Self Care | Payer: Commercial Managed Care - HMO

## 2016-01-14 DIAGNOSIS — G5602 Carpal tunnel syndrome, left upper limb: Secondary | ICD-10-CM

## 2016-01-14 NOTE — ED Notes (Signed)
Patient denies pain and is resting comfortably.  

## 2016-01-14 NOTE — ED Triage Notes (Signed)
C/o tingling in left arm and left leg Denies any chest pain States sees PCP tomorroow

## 2016-01-14 NOTE — ED Provider Notes (Signed)
[place]    CSN: [id number] Arrival date & time: 01/14/16  1651  First Provider Contact:  First MD Initiated Contact with Patient 01/14/16 1827        History   Chief Complaint Chief Complaint  Patient presents with  . Tingling    HPI Corey Oliver is a 50 y.o. male.   The history is provided by the patient.  Hand Pain  This is a recurrent problem. The current episode started 1 to 2 hours ago. The problem has been resolved. Pertinent negatives include no chest pain, no abdominal pain, no headaches and no shortness of breath. Nothing aggravates the symptoms. Nothing relieves the symptoms.    Past Medical History:  Diagnosis Date  . Cystic acne   . GERD (gastroesophageal reflux disease)   . Hemorrhoids   . Hypertension   . IBS (irritable bowel syndrome)   . Influenza 05/23/12  . URI (upper respiratory infection)     Patient Active Problem List   Diagnosis Date Noted  . Chest pressure 11/07/2015  . Wheezing 11/07/2015  . B12 deficiency 04/21/2015  . Cerumen impaction 04/21/2015  . Headache 04/21/2015  . Shoulder pain, right 11/06/2014  . Fatigue 11/06/2014  . Well adult exam 02/29/2012  . Obstructive sleep apnea 08/27/2009  . 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 Vitamin D deficiency 08/26/2009  . SHOULDER PAIN, LEFT 06/24/2009  . CYSTIC ACNE 01/30/2008  . Essential hypertension 06/13/2007  . GERD 02/08/2007  . IBS 02/08/2007    Past Surgical History:  Procedure Laterality Date  . APPENDECTOMY    . arthroscopic surgery left shoulder     for bone spurs jan '11       Home Medications    Prior to Admission medications   Medication Sig Start Date End Date Taking? Authorizing Provider  Albuterol Sulfate (PROAIR RESPICLICK) 123XX123 (90 BASE) MCG/ACT AEPB Inhale 1-2 puffs into the lungs 4 (four) times daily as needed. 09/03/14   Evie Lacks Plotnikov, MD  Armodafinil (NUVIGIL) 150 MG tablet Take 1 tablet (150 mg total) by mouth daily.  04/21/15   Cassandria Anger, MD  Cholecalciferol (VITAMIN D3) 2000 UNITS capsule Take 1 capsule (2,000 Units total) by mouth daily. 04/21/15   Evie Lacks Plotnikov, MD  Cyanocobalamin (VITAMIN B-12) 1000 MCG SUBL Place 1 tablet (1,000 mcg total) under the tongue daily. 04/21/15   Evie Lacks Plotnikov, MD  dexlansoprazole (DEXILANT) 60 MG capsule Take 1 capsule (60 mg total) by mouth daily. 04/21/15   Cassandria Anger, MD  ergocalciferol (VITAMIN D2) 50000 units capsule Take 1 capsule (50,000 Units total) by mouth once a week. 12/03/15   Evie Lacks Plotnikov, MD  fluticasone (FLONASE) 50 MCG/ACT nasal spray Place 2 sprays into the nose daily. 03/15/13   Neena Rhymes, MD  hydrocortisone (ANUSOL-HC) 25 MG suppository PLACE 1 SUPPOSITORY RECTALLY EVERY 12 HOURS. 05/21/13   Neena Rhymes, MD  hydrocortisone-pramoxine [facility]) 2.5-1 % rectal cream Place 1 application rectally 3 (three) times daily. 05/29/13   Neena Rhymes, MD  ketoconazole (NIZORAL) 2 % shampoo Apply 1 application topically 2 (two) times a week. As directed 11/07/15   Cassandria Anger, MD  losartan-hydrochlorothiazide (HYZAAR) 100-25 MG per tablet Take 1 tablet by mouth daily. 12/23/14   Cassandria Anger, MD  sildenafil (VIAGRA) 100 MG tablet Take 1 tablet (100 mg total) by mouth as needed for erectile dysfunction. 11/07/15   Evie Lacks Plotnikov, MD  SUMAtriptan (IMITREX) 100 MG tablet Take 1  tablet (100 mg total) by mouth once. May repeat in 2 hours if headache persists or recurs. 04/21/15   Evie Lacks Plotnikov, MD  traZODone (DESYREL) 50 MG tablet Take 1 tablet (50 mg total) by mouth at bedtime as needed for sleep. 11/07/15   Cassandria Anger, MD    Family History Family History  Problem Relation Age of Onset  . Colon cancer Neg Hx   . Ovarian cancer Mother     Social History Social History  Substance Use Topics  . Smoking status: Never Smoker  . Smokeless tobacco: Never Used  . Alcohol use No      Allergies   Review of patient's allergies indicates no known allergies.   Review of Systems Review of Systems  HENT: Negative.   Respiratory: Negative for shortness of breath.   Cardiovascular: Negative.  Negative for chest pain.  Gastrointestinal: Negative for abdominal pain.  Musculoskeletal: Positive for myalgias. Negative for neck stiffness.  Skin: Negative.   Neurological: Negative.  Negative for headaches.  All other systems reviewed and are negative.    Physical Exam Triage Vital Signs ED Triage Vitals [01/14/16 1748]  Enc Vitals Group     BP 131/98     Pulse Rate 82     Resp 16     Temp 98.3 F (36.8 C)     Temp Source Oral     SpO2 99 %     Weight 210 lb (95.3 kg)     Height 5\' 5"  (1.651 m)     Head Circumference      Peak Flow      Pain Score      Pain Loc      Pain Edu?      Excl. in [place]?    No data found.   Updated Vital Signs BP 131/98 (BP Location: Right Arm)   Pulse 82   Temp 98.3 F (36.8 C) (Oral)   Resp 16   Ht 5\' 5"  (1.651 m)   Wt 210 lb (95.3 kg)   SpO2 99%   BMI 34.95 kg/m   Visual Acuity Right Eye Distance:   Left Eye Distance:   Bilateral Distance:    Right Eye Near:   Left Eye Near:    Bilateral Near:     Physical Exam  Constitutional: He is oriented to person, place, and time. He appears well-developed and well-nourished.  HENT:  Head: Normocephalic.  Eyes: Pupils are equal, round, and reactive to light.  Neck: Normal range of motion. Neck supple.  Cardiovascular: Normal rate, regular rhythm, normal heart sounds and intact distal pulses.   Pulmonary/Chest: Effort normal and breath sounds normal.  Musculoskeletal: Normal range of motion. He exhibits no tenderness.  Neg tinels sign, pulses nl.  Lymphadenopathy:    He has no cervical adenopathy.  Neurological: He is alert and oriented to person, place, and time. He has normal reflexes. No cranial nerve deficit.  Skin: Skin is warm and dry.  Nursing note and vitals  reviewed.    UC Treatments / Results  Labs (all labs ordered are listed, but only abnormal results are displayed) Labs Reviewed - No data to display  EKG  EKG Interpretation None       Radiology No results found.  Procedures Procedures (including critical care time)  Medications Ordered in UC Medications - No data to display   Initial Impression / Assessment and Plan / UC Course  I have reviewed the triage vital signs and the nursing notes.  Pertinent labs & imaging results that were available during my care of the patient were reviewed by me and considered in my medical decision making (see chart for details).  Clinical Course      Final Clinical Impressions(s) / UC Diagnoses   Final diagnoses:  None    New Prescriptions New Prescriptions   No medications on file     Billy Fischer, MD 01/14/16 [phone]

## 2016-01-16 ENCOUNTER — Encounter: Payer: Self-pay | Admitting: Pulmonary Disease

## 2016-01-16 ENCOUNTER — Ambulatory Visit (INDEPENDENT_AMBULATORY_CARE_PROVIDER_SITE_OTHER): Payer: Commercial Managed Care - HMO | Admitting: Pulmonary Disease

## 2016-01-16 VITALS — BP 158/98 | HR 94 | Ht 66.0 in | Wt 224.0 lb

## 2016-01-16 DIAGNOSIS — G4733 Obstructive sleep apnea (adult) (pediatric): Secondary | ICD-10-CM | POA: Diagnosis not present

## 2016-01-16 NOTE — Progress Notes (Signed)
Current Outpatient Prescriptions on File Prior to Visit  Medication Sig  . Albuterol Sulfate (PROAIR RESPICLICK) 123XX123 (90 BASE) MCG/ACT AEPB Inhale 1-2 puffs into the lungs 4 (four) times daily as needed.  . Armodafinil (NUVIGIL) 150 MG tablet Take 1 tablet (150 mg total) by mouth daily.  . Cholecalciferol (VITAMIN D3) 2000 UNITS capsule Take 1 capsule (2,000 Units total) by mouth daily.  . Cyanocobalamin (VITAMIN B-12) 1000 MCG SUBL Place 1 tablet (1,000 mcg total) under the tongue daily.  [name] dexlansoprazole (DEXILANT) 60 MG capsule Take 1 capsule (60 mg total) by mouth daily.  . ergocalciferol (VITAMIN D2) 50000 units capsule Take 1 capsule (50,000 Units total) by mouth once a week.  . fluticasone (FLONASE) 50 MCG/ACT nasal spray Place 2 sprays into the nose daily.  . hydrocortisone (ANUSOL-HC) 25 MG suppository PLACE 1 SUPPOSITORY RECTALLY EVERY 12 HOURS.  . hydrocortisone-pramoxine (ANALPRAM-HC) 2.5-1 % rectal cream Place 1 application rectally 3 (three) times daily.  [name] ketoconazole (NIZORAL) 2 % shampoo Apply 1 application topically 2 (two) times a week. As directed  . losartan-hydrochlorothiazide (HYZAAR) 100-25 MG per tablet Take 1 tablet by mouth daily.  . sildenafil (VIAGRA) 100 MG tablet Take 1 tablet (100 mg total) by mouth as needed for erectile dysfunction.  . SUMAtriptan (IMITREX) 100 MG tablet Take 1 tablet (100 mg total) by mouth once. May repeat in 2 hours if headache persists or recurs.  . traZODone (DESYREL) 50 MG tablet Take 1 tablet (50 mg total) by mouth at bedtime as needed for sleep.  . [DISCONTINUED] vardenafil (LEVITRA) 20 MG tablet Take 1 tablet (20 mg total) by mouth daily as needed for erectile dysfunction.   No current facility-administered medications on file prior to visit.      Chief Complaint  Patient presents with  . Sleep Apnea    Not using anything currently to treat OSA.     Sleep tests PSG 09/15/13 >> AHI 15, SaO2 low 78%  Past medical hx GERD,  IBS, HTN  Past surgical hx, Allergies, Family hx, Social hx all reviewed.  Vital Signs BP (!) 158/98 (BP Location: Left Arm, Cuff Size: Normal)   Pulse 94   Ht 5\' 6"  (1.676 m)   Wt 224 lb (101.6 kg)   SpO2 96%   BMI 36.15 kg/m   History of Present Illness Corey Oliver is a 50 y.o. male with obstructive sleep apnea.  He never got oral appliance made.  He was told this wouldn't be covered by dental insurance.  He never had it checked with medical insurance.  He is still having snoring, and stops breathing while asleep.  He is a restless sleeper, and feels sleepy during the day.  He has been prescribed nuvigil.  He also has trazodone.  He is on several medications for his blood pressure.    Physical Exam  General - No distress ENT - No sinus tenderness, no oral exudate, no LAN, MP 4, enlarged tongue, 2+ tonsils, elongated uvula Cardiac - s1s2 regular, no murmur Chest - No wheeze/rales/dullness Back - No focal tenderness Abd - Soft, non-tender Ext - No edema Neuro - Normal strength Skin - No rashes Psych - normal mood, and behavior   Assessment/Plan  Obstructive sleep apnea. - explained that oral appliance use for tx of OSA is a medical therapy and should be covered by medical insurance - will arrange for referral to Dr. Oneal Grout again to assess for oral appliance   Patient Instructions  Will try to  get you set up again with Dr. Ron Parker to get oral appliance to treat obstructive sleep apnea  Follow up in 6 months     Chesley Mires, MD [facility] Pager:  [phone] 01/16/2016, 4:34 PM

## 2016-01-16 NOTE — Patient Instructions (Signed)
Will try to get you set up again with Dr. Ron Parker to get oral appliance to treat obstructive sleep apnea  Follow up in 6 months

## 2016-01-21 ENCOUNTER — Telehealth (HOSPITAL_COMMUNITY): Payer: Self-pay | Admitting: *Deleted

## 2016-01-21 NOTE — Telephone Encounter (Signed)
Attempted to call patient regarding upcoming appointment for nuclear test- no answer.   Corey Oliver

## 2016-01-22 ENCOUNTER — Other Ambulatory Visit: Payer: Self-pay | Admitting: Internal Medicine

## 2016-01-23 ENCOUNTER — Ambulatory Visit (HOSPITAL_COMMUNITY): Payer: Commercial Managed Care - HMO | Attending: Internal Medicine

## 2016-01-23 DIAGNOSIS — R0789 Other chest pain: Secondary | ICD-10-CM

## 2016-01-23 DIAGNOSIS — I10 Essential (primary) hypertension: Secondary | ICD-10-CM | POA: Diagnosis not present

## 2016-01-23 DIAGNOSIS — R062 Wheezing: Secondary | ICD-10-CM | POA: Diagnosis not present

## 2016-01-23 LAB — MYOCARDIAL PERFUSION IMAGING
CHL CUP NUCLEAR SDS: 4
CHL CUP NUCLEAR SRS: 3
CHL CUP RESTING HR STRESS: 78 {beats}/min
CSEPPHR: 127 {beats}/min
LV dias vol: 108 mL (ref 62–150)
LV sys vol: 45 mL
NUC STRESS TID: 0.91
RATE: 0.33
SSS: 5

## 2016-01-23 MED ORDER — TECHNETIUM TC 99M TETROFOSMIN IV KIT
32.6000 | PACK | Freq: Once | INTRAVENOUS | Status: AC | PRN
  Administered 2016-01-23: 33 via INTRAVENOUS
  Filled 2016-01-23: qty 33

## 2016-01-23 MED ORDER — REGADENOSON 0.4 MG/5ML IV SOLN
0.4000 mg | Freq: Once | INTRAVENOUS | Status: AC
  Administered 2016-01-23: 0.4 mg via INTRAVENOUS

## 2016-01-23 MED ORDER — TECHNETIUM TC 99M TETROFOSMIN IV KIT
10.2000 | PACK | Freq: Once | INTRAVENOUS | Status: AC | PRN
  Administered 2016-01-23: 10 via INTRAVENOUS
  Filled 2016-01-23: qty 10

## 2016-01-30 ENCOUNTER — Telehealth: Payer: Self-pay | Admitting: Internal Medicine

## 2016-01-30 NOTE — Telephone Encounter (Signed)
Patient states he is out of B12.  He is requesting a higher strength.  Patient uses [facility] [address].

## 2016-01-30 NOTE — Telephone Encounter (Signed)
Is he willing to switch to shots of B12? He can come for a Vit B12 blood test Thx

## 2016-02-02 ENCOUNTER — Other Ambulatory Visit: Payer: Self-pay | Admitting: Internal Medicine

## 2016-02-02 DIAGNOSIS — E538 Deficiency of other specified B group vitamins: Secondary | ICD-10-CM

## 2016-02-02 NOTE — Telephone Encounter (Signed)
Notified pt w/MD response agreed to start injection, but will come in to have levels check first. Place order in epic. Pt is also wanting to know his stress test results...Corey Oliver

## 2016-02-02 NOTE — Telephone Encounter (Signed)
OK - B12 ordered Stress test was nl Thx

## 2016-02-03 NOTE — Telephone Encounter (Signed)
Notified pt w/MD response.../lmb 

## 2016-02-17 ENCOUNTER — Other Ambulatory Visit: Payer: Self-pay | Admitting: Internal Medicine

## 2016-05-07 ENCOUNTER — Ambulatory Visit: Payer: Commercial Managed Care - HMO | Admitting: Internal Medicine

## 2016-06-18 ENCOUNTER — Ambulatory Visit: Payer: Commercial Managed Care - HMO | Admitting: Internal Medicine

## 2016-06-19 ENCOUNTER — Other Ambulatory Visit: Payer: Self-pay | Admitting: Internal Medicine

## 2016-06-25 ENCOUNTER — Ambulatory Visit (INDEPENDENT_AMBULATORY_CARE_PROVIDER_SITE_OTHER): Payer: Commercial Managed Care - HMO | Admitting: Internal Medicine

## 2016-06-25 ENCOUNTER — Encounter: Payer: Self-pay | Admitting: Internal Medicine

## 2016-06-25 DIAGNOSIS — N529 Male erectile dysfunction, unspecified: Secondary | ICD-10-CM | POA: Diagnosis not present

## 2016-06-25 DIAGNOSIS — I10 Essential (primary) hypertension: Secondary | ICD-10-CM | POA: Diagnosis not present

## 2016-06-25 DIAGNOSIS — E538 Deficiency of other specified B group vitamins: Secondary | ICD-10-CM

## 2016-06-25 DIAGNOSIS — E559 Vitamin D deficiency, unspecified: Secondary | ICD-10-CM | POA: Diagnosis not present

## 2016-06-25 MED ORDER — CYANOCOBALAMIN 500 MCG/0.1ML NA SOLN: 500.0000 ug | NASAL | 11 refills | Status: DC

## 2016-06-25 MED ORDER — ERGOCALCIFEROL 1.25 MG (50000 UT) PO CAPS: 50000.0000 [IU] | ORAL_CAPSULE | ORAL | 3 refills | Status: DC

## 2016-06-25 MED ORDER — CYANOCOBALAMIN 1000 MCG/ML IJ SOLN
1000.0000 ug | Freq: Once | INTRAMUSCULAR | Status: AC
  Administered 2016-06-25: 1000 ug via INTRAMUSCULAR

## 2016-06-25 NOTE — Progress Notes (Signed)
Pre visit review using our clinic review tool, if applicable. No additional management support is needed unless otherwise documented below in the visit note. 

## 2016-06-25 NOTE — Assessment & Plan Note (Signed)
Losartan HCT 

## 2016-06-25 NOTE — Progress Notes (Signed)
Subjective:  Patient ID: Corey Oliver, male    DOB: [birth year]  Age: 51 y.o. MRN: [id number]  CC: No chief complaint on file.   HPI Corey Oliver presents for HTN, B12 def, Vit D def. C/o ED    Outpatient Medications Prior to Visit  Medication Sig Dispense Refill  . Albuterol Sulfate (PROAIR RESPICLICK) 123XX123 (90 BASE) MCG/ACT AEPB Inhale 1-2 puffs into the lungs 4 (four) times daily as needed. 1 each 2  . Armodafinil (NUVIGIL) 150 MG tablet Take 1 tablet (150 mg total) by mouth daily. 30 tablet 5  . Cholecalciferol (VITAMIN D3) 2000 UNITS capsule Take 1 capsule (2,000 Units total) by mouth daily. 100 capsule 3  . DEXILANT 60 MG capsule TAKE 1 CAPSULE (60 MG TOTAL) BY MOUTH DAILY. 30 capsule 5  . fluticasone (FLONASE) 50 MCG/ACT nasal spray Place 2 sprays into the nose daily. 16 g 10  . hydrocortisone (ANUSOL-HC) 25 MG suppository PLACE 1 SUPPOSITORY RECTALLY EVERY 12 HOURS. 12 suppository 0  . hydrocortisone-pramoxine (ANALPRAM-HC) 2.5-1 % rectal cream Place 1 application rectally 3 (three) times daily. 30 g 0  . HYZAAR 100-25 MG tablet TAKE 1 TABLET BY MOUTH DAILY. 90 tablet 1  . ketoconazole (NIZORAL) 2 % shampoo Apply 1 application topically 2 (two) times a week. As directed 120 mL 5  . sildenafil (VIAGRA) 100 MG tablet Take 1 tablet (100 mg total) by mouth as needed for erectile dysfunction. 10 tablet 11  . SUMAtriptan (IMITREX) 100 MG tablet Take 1 tablet (100 mg total) by mouth once. May repeat in 2 hours if headache persists or recurs. 12 tablet 3  . traZODone (DESYREL) 50 MG tablet Take 1 tablet (50 mg total) by mouth at bedtime as needed for sleep. 30 tablet 5  . Cyanocobalamin (VITAMIN B-12) 1000 MCG SUBL Place 1 tablet (1,000 mcg total) under the tongue daily. (Patient not taking: Reported on 06/25/2016) 100 tablet 3  . ergocalciferol (VITAMIN D2) 50000 units capsule Take 1 capsule (50,000 Units total) by mouth once a week. (Patient not taking: Reported on 06/25/2016) 6 capsule 0     No facility-administered medications prior to visit.     ROS Review of Systems  Constitutional: Negative for appetite change, fatigue and unexpected weight change.  HENT: Negative for congestion, nosebleeds, sneezing, sore throat and trouble swallowing.   Eyes: Negative for itching and visual disturbance.  Respiratory: Negative for cough.   Cardiovascular: Negative for chest pain, palpitations and leg swelling.  Gastrointestinal: Negative for abdominal distention, blood in stool, diarrhea and nausea.  Genitourinary: Negative for frequency and hematuria.  Musculoskeletal: Negative for back pain, gait problem, joint swelling and neck pain.  Skin: Negative for rash.  Neurological: Negative for dizziness, tremors, speech difficulty and weakness.  Psychiatric/Behavioral: Negative for agitation, dysphoric mood and sleep disturbance. The patient is not nervous/anxious.     Objective:  BP 112/78   Pulse (!) 112   Temp 98.4 F (36.9 C) (Oral)   Resp 16   Ht 5\' 6"  (1.676 m)   Wt 229 lb (103.9 kg)   SpO2 96%   BMI 36.96 kg/m   BP Readings from Last 3 Encounters:  06/25/16 112/78  01/16/16 (!) 158/98  01/14/16 131/98    Wt Readings from Last 3 Encounters:  06/25/16 229 lb (103.9 kg)  01/16/16 224 lb (101.6 kg)  01/14/16 210 lb (95.3 kg)    Physical Exam  Constitutional: He is oriented to person, place, and time. He appears well-developed. No  distress.  NAD  HENT:  Mouth/Throat: Oropharynx is clear and moist.  Eyes: Conjunctivae are normal. Pupils are equal, round, and reactive to light.  Neck: Normal range of motion. No JVD present. No thyromegaly present.  Cardiovascular: Normal rate, regular rhythm, normal heart sounds and intact distal pulses.  Exam reveals no gallop and no friction rub.   No murmur heard. Pulmonary/Chest: Effort normal and breath sounds normal. No respiratory distress. He has no wheezes. He has no rales. He exhibits no tenderness.  Abdominal: Soft.  Bowel sounds are normal. He exhibits no distension and no mass. There is no tenderness. There is no rebound and no guarding.  Musculoskeletal: Normal range of motion. He exhibits no edema or tenderness.  Lymphadenopathy:    He has no cervical adenopathy.  Neurological: He is alert and oriented to person, place, and time. He has normal reflexes. No cranial nerve deficit. He exhibits normal muscle tone. He displays a negative Romberg sign. Coordination and gait normal.  Skin: Skin is warm and dry. No rash noted.  Psychiatric: He has a normal mood and affect. His behavior is normal. Judgment and thought content normal.    Lab Results  Component Value Date   WBC 4.9 12/03/2015   HGB 15.3 12/03/2015   HCT 44.4 12/03/2015   PLT 298.0 12/03/2015   GLUCOSE 90 12/03/2015   CHOL 187 12/03/2015   TRIG 48.0 12/03/2015   HDL 49.10 12/03/2015   LDLCALC 128 (H) 12/03/2015   ALT 14 12/03/2015   AST 19 12/03/2015   NA 137 12/03/2015   K 4.1 12/03/2015   CL 101 12/03/2015   CREATININE 1.09 12/03/2015   BUN 12 12/03/2015   CO2 27 12/03/2015   TSH 2.40 12/03/2015   PSA 1.37 12/03/2015    No results found.  Assessment & Plan:   There are no diagnoses linked to this encounter. I am having Mr. Junious maintain his fluticasone, hydrocortisone, hydrocortisone-pramoxine, Albuterol Sulfate, Armodafinil, Vitamin D3, Vitamin B-12, SUMAtriptan, ketoconazole, traZODone, sildenafil, ergocalciferol, HYZAAR, and DEXILANT.  No orders of the defined types were placed in this encounter.    Follow-up: No Follow-up on file.  Walker Kehr, MD

## 2016-06-25 NOTE — Assessment & Plan Note (Signed)
Severe - Urol appt pending

## 2016-06-25 NOTE — Assessment & Plan Note (Signed)
Risks associated with treatment noncompliance were discussed. Compliance was encouraged. Vit D 50000 iu weekly

## 2016-06-25 NOTE — Assessment & Plan Note (Signed)
Inj today Vit B12 nasal

## 2016-06-25 NOTE — Patient Instructions (Signed)

## 2016-07-05 ENCOUNTER — Telehealth: Payer: Self-pay | Admitting: Internal Medicine

## 2016-07-05 NOTE — Telephone Encounter (Signed)
[facility]  Patient Name: Corey Oliver  DOB: 04-12-1966    Initial Comment Caller states that he was seen last week for heart burn, and it taking medicaiton. Today he is having pain in the middle of his chest.   Nurse Assessment  Nurse: Alvis Lemmings, RN, Marcie Bal Date/Time Eilene Ghazi Time): 07/05/2016 4:58:04 PM  Confirm and document reason for call. If symptomatic, describe symptoms. ---Caller states that he was seen last week for heart burn, and it taking medication. Today he is having pain in the middle of his chest. Pain to the middle of back too.  Does the patient have any new or worsening symptoms? ---Yes  Will a triage be completed? ---Yes  Related visit to physician within the last 2 weeks? ---Yes  Does the PT have any chronic conditions? (i.e. diabetes, asthma, etc.) ---Yes  List chronic conditions. ---HTN, stomach,  Is this a behavioral health or substance abuse call? ---No     Guidelines    Guideline Title Affirmed Question Affirmed Notes  Chest Pain [1] Chest pain lasts > 5 minutes AND [2] age > 30 AND [3] at least one cardiac risk factor (i.e., hypertension, diabetes, obesity, smoker or strong family history of heart disease)    Final Disposition User   Call EMS 911 Now Alvis Lemmings, RN, Marcie Bal    Disagree/Comply: Disagree  Disagree/Comply Reason: Disagree with instructions

## 2016-07-07 ENCOUNTER — Ambulatory Visit: Payer: Commercial Managed Care - HMO | Admitting: Internal Medicine

## 2016-08-27 DIAGNOSIS — R899 Unspecified abnormal finding in specimens from other organs, systems and tissues: Secondary | ICD-10-CM | POA: Diagnosis not present

## 2016-09-06 DIAGNOSIS — R899 Unspecified abnormal finding in specimens from other organs, systems and tissues: Secondary | ICD-10-CM | POA: Diagnosis not present

## 2016-09-06 DIAGNOSIS — Z5181 Encounter for therapeutic drug level monitoring: Secondary | ICD-10-CM | POA: Diagnosis not present

## 2016-09-06 DIAGNOSIS — R7989 Other specified abnormal findings of blood chemistry: Secondary | ICD-10-CM | POA: Diagnosis not present

## 2016-09-06 DIAGNOSIS — Z125 Encounter for screening for malignant neoplasm of prostate: Secondary | ICD-10-CM | POA: Diagnosis not present

## 2016-09-26 ENCOUNTER — Other Ambulatory Visit: Payer: Self-pay | Admitting: Internal Medicine

## 2016-10-31 ENCOUNTER — Other Ambulatory Visit: Payer: Self-pay | Admitting: Internal Medicine

## 2016-11-08 DIAGNOSIS — Z83511 Family history of glaucoma: Secondary | ICD-10-CM | POA: Diagnosis not present

## 2016-11-08 DIAGNOSIS — H40013 Open angle with borderline findings, low risk, bilateral: Secondary | ICD-10-CM | POA: Diagnosis not present

## 2016-11-29 ENCOUNTER — Other Ambulatory Visit: Payer: Self-pay | Admitting: Internal Medicine

## 2017-01-18 DIAGNOSIS — N182 Chronic kidney disease, stage 2 (mild): Secondary | ICD-10-CM | POA: Diagnosis not present

## 2017-01-18 DIAGNOSIS — I129 Hypertensive chronic kidney disease with stage 1 through stage 4 chronic kidney disease, or unspecified chronic kidney disease: Secondary | ICD-10-CM | POA: Diagnosis not present

## 2017-01-18 DIAGNOSIS — R5383 Other fatigue: Secondary | ICD-10-CM | POA: Diagnosis not present

## 2017-01-18 DIAGNOSIS — E7800 Pure hypercholesterolemia, unspecified: Secondary | ICD-10-CM | POA: Diagnosis not present

## 2017-01-18 DIAGNOSIS — N400 Enlarged prostate without lower urinary tract symptoms: Secondary | ICD-10-CM | POA: Diagnosis not present

## 2017-01-24 ENCOUNTER — Other Ambulatory Visit: Payer: Self-pay | Admitting: Internal Medicine

## 2017-01-25 ENCOUNTER — Other Ambulatory Visit: Payer: Self-pay | Admitting: General Practice

## 2017-01-25 MED ORDER — DEXLANSOPRAZOLE 60 MG PO CPDR: 60.0000 mg | DELAYED_RELEASE_CAPSULE | Freq: Every day | ORAL | 3 refills | Status: DC

## 2017-01-28 ENCOUNTER — Encounter: Payer: Self-pay | Admitting: Nurse Practitioner

## 2017-01-28 ENCOUNTER — Ambulatory Visit (INDEPENDENT_AMBULATORY_CARE_PROVIDER_SITE_OTHER): Payer: 59 | Admitting: Nurse Practitioner

## 2017-01-28 VITALS — BP 132/98 | HR 103 | Temp 98.4°F | Ht 66.0 in | Wt 223.0 lb

## 2017-01-28 DIAGNOSIS — J069 Acute upper respiratory infection, unspecified: Secondary | ICD-10-CM | POA: Diagnosis not present

## 2017-01-28 MED ORDER — GUAIFENESIN ER 600 MG PO TB12: 600.0000 mg | ORAL_TABLET | Freq: Two times a day (BID) | ORAL | 0 refills | Status: DC | PRN

## 2017-01-28 MED ORDER — PROMETHAZINE-DM 6.25-15 MG/5ML PO SYRP: 5.0000 mL | ORAL_SOLUTION | Freq: Three times a day (TID) | ORAL | 0 refills | Status: DC | PRN

## 2017-01-28 MED ORDER — IPRATROPIUM BROMIDE 0.03 % NA SOLN: 2.0000 | Freq: Two times a day (BID) | NASAL | 0 refills | Status: AC

## 2017-01-28 NOTE — Patient Instructions (Signed)

## 2017-01-28 NOTE — Progress Notes (Signed)
Subjective:  Patient ID: Corey Oliver, male    DOB: [birth year]  Age: 51 y.o. MRN: [id number]  CC: Nasal Congestion (congestion,sore throat,lossing voice--going on since yesterday./ didnt take his BP med this morning. )   URI   This is a recurrent problem. The current episode started in the past 7 days. The problem has been gradually worsening. Associated symptoms include congestion, coughing, headaches, rhinorrhea, sinus pain, sneezing and a sore throat. Pertinent negatives include no nausea, vomiting or wheezing. He has tried nothing for the symptoms.    Outpatient Medications Prior to Visit  Medication Sig Dispense Refill  . Albuterol Sulfate (PROAIR RESPICLICK) 016 (90 BASE) MCG/ACT AEPB Inhale 1-2 puffs into the lungs 4 (four) times daily as needed. 1 each 2  . Armodafinil (NUVIGIL) 150 MG tablet Take 1 tablet (150 mg total) by mouth daily. 30 tablet 5  . Cholecalciferol (VITAMIN D3) 2000 UNITS capsule Take 1 capsule (2,000 Units total) by mouth daily. 100 capsule 3  . Cyanocobalamin (NASCOBAL) 500 MCG/0.1ML SOLN Place 0.1 mLs (500 mcg total) into the nose once a week. 1.3 mL 11  . dexlansoprazole (DEXILANT) 60 MG capsule Take 1 capsule (60 mg total) by mouth daily. 30 capsule 3  . ergocalciferol (VITAMIN D2) 50000 units capsule Take 1 capsule (50,000 Units total) by mouth once a week. 12 capsule 3  . fluticasone (FLONASE) 50 MCG/ACT nasal spray Place 2 sprays into the nose daily. 16 g 10  . hydrocortisone (ANUSOL-HC) 25 MG suppository PLACE 1 SUPPOSITORY RECTALLY EVERY 12 HOURS. 12 suppository 0  . hydrocortisone-pramoxine (ANALPRAM-HC) 2.5-1 % rectal cream Place 1 application rectally 3 (three) times daily. 30 g 0  . HYZAAR 100-25 MG tablet Take 1 tablet by mouth daily. Annual appt w/labs due in June must see MD for refills 90 tablet 0  . ketoconazole (NIZORAL) 2 % shampoo Apply 1 application topically 2 (two) times a week. As directed 120 mL 5  . SUMAtriptan (IMITREX) 100 MG tablet  Take 1 tablet (100 mg total) by mouth once. May repeat in 2 hours if headache persists or recurs. 12 tablet 3  . HYZAAR 100-25 MG tablet Take 1 tablet by mouth daily. Annual appt w/labs is due must see Md for refills (Patient not taking: Reported on 01/28/2017) 30 tablet 0  . HYZAAR 100-25 MG tablet TAKE 1 TABLET BY MOUTH DAILY. (Patient not taking: Reported on 01/28/2017) 90 tablet 3   No facility-administered medications prior to visit.     ROS See HPI  Objective:  BP (!) 132/98   Pulse (!) 103   Temp 98.4 F (36.9 C)   Ht 5\' 6"  (1.676 m)   Wt 223 lb (101.2 kg)   SpO2 98%   BMI 35.99 kg/m   BP Readings from Last 3 Encounters:  01/28/17 (!) 132/98  06/25/16 112/78  01/16/16 (!) 158/98    Wt Readings from Last 3 Encounters:  01/28/17 223 lb (101.2 kg)  06/25/16 229 lb (103.9 kg)  01/16/16 224 lb (101.6 kg)    Physical Exam  Constitutional: He is oriented to person, place, and time. No distress.  HENT:  Right Ear: Tympanic membrane, external ear and ear canal normal.  Left Ear: Tympanic membrane and ear canal normal.  Nose: Mucosal edema and rhinorrhea present. Right sinus exhibits maxillary sinus tenderness. Right sinus exhibits no frontal sinus tenderness. Left sinus exhibits maxillary sinus tenderness. Left sinus exhibits no frontal sinus tenderness.  Mouth/Throat: Uvula is midline. Posterior oropharyngeal erythema present. No  oropharyngeal exudate.  Eyes: No scleral icterus.  Neck: Normal range of motion. Neck supple.  Cardiovascular: Normal rate and regular rhythm.   Pulmonary/Chest: Effort normal and breath sounds normal.  Lymphadenopathy:    He has no cervical adenopathy.  Neurological: He is alert and oriented to person, place, and time.  Vitals reviewed.   Lab Results  Component Value Date   WBC 4.9 12/03/2015   HGB 15.3 12/03/2015   HCT 44.4 12/03/2015   PLT 298.0 12/03/2015   GLUCOSE 90 12/03/2015   CHOL 187 12/03/2015   TRIG 48.0 12/03/2015   HDL  49.10 12/03/2015   LDLCALC 128 (H) 12/03/2015   ALT 14 12/03/2015   AST 19 12/03/2015   NA 137 12/03/2015   K 4.1 12/03/2015   CL 101 12/03/2015   CREATININE 1.09 12/03/2015   BUN 12 12/03/2015   CO2 27 12/03/2015   TSH 2.40 12/03/2015   PSA 1.37 12/03/2015    No results found.  Assessment & Plan:   Corey Oliver was seen today for nasal congestion.  Diagnoses and all orders for this visit:  Acute URI -     ipratropium (ATROVENT) 0.03 % nasal spray; Place 2 sprays into both nostrils 2 (two) times daily. Do not use for more than 5days. -     guaiFENesin (MUCINEX) 600 MG 12 hr tablet; Take 1 tablet (600 mg total) by mouth 2 (two) times daily as needed for cough or to loosen phlegm. -     promethazine-dextromethorphan (PROMETHAZINE-DM) 6.25-15 MG/5ML syrup; Take 5 mLs by mouth 3 (three) times daily as needed for cough.   I am having Corey Oliver start on ipratropium, guaiFENesin, and promethazine-dextromethorphan. I am also having him maintain his fluticasone, hydrocortisone, hydrocortisone-pramoxine, Albuterol Sulfate, Armodafinil, Vitamin D3, SUMAtriptan, ketoconazole, ergocalciferol, Cyanocobalamin, HYZAAR, HYZAAR, HYZAAR, and dexlansoprazole.  Meds ordered this encounter  Medications  . ipratropium (ATROVENT) 0.03 % nasal spray    Sig: Place 2 sprays into both nostrils 2 (two) times daily. Do not use for more than 5days.    Dispense:  30 mL    Refill:  0    Order Specific Question:   Supervising Provider    Answer:   Cassandria Anger [[id number]]  . guaiFENesin (MUCINEX) 600 MG 12 hr tablet    Sig: Take 1 tablet (600 mg total) by mouth 2 (two) times daily as needed for cough or to loosen phlegm.    Dispense:  14 tablet    Refill:  0    Order Specific Question:   Supervising Provider    Answer:   Cassandria Anger [[id number]]  . promethazine-dextromethorphan (PROMETHAZINE-DM) 6.25-15 MG/5ML syrup    Sig: Take 5 mLs by mouth 3 (three) times daily as needed for cough.    Dispense:  240  mL    Refill:  0    Order Specific Question:   Supervising Provider    Answer:   Cassandria Anger [[id number]]    Follow-up: Return if symptoms worsen or fail to improve.  Wilfred Lacy, NP

## 2017-02-26 ENCOUNTER — Other Ambulatory Visit: Payer: Self-pay | Admitting: Internal Medicine

## 2017-03-25 ENCOUNTER — Other Ambulatory Visit: Payer: Self-pay | Admitting: Internal Medicine

## 2017-04-07 DIAGNOSIS — I861 Scrotal varices: Secondary | ICD-10-CM | POA: Diagnosis not present

## 2017-04-07 DIAGNOSIS — R972 Elevated prostate specific antigen [PSA]: Secondary | ICD-10-CM | POA: Diagnosis not present

## 2017-04-22 DIAGNOSIS — H40053 Ocular hypertension, bilateral: Secondary | ICD-10-CM | POA: Diagnosis not present

## 2017-04-22 DIAGNOSIS — H40013 Open angle with borderline findings, low risk, bilateral: Secondary | ICD-10-CM | POA: Diagnosis not present

## 2017-04-27 ENCOUNTER — Ambulatory Visit (INDEPENDENT_AMBULATORY_CARE_PROVIDER_SITE_OTHER): Payer: 59 | Admitting: Internal Medicine

## 2017-04-27 ENCOUNTER — Encounter: Payer: Self-pay | Admitting: Internal Medicine

## 2017-04-27 VITALS — BP 126/84 | HR 88 | Temp 98.2°F | Ht 66.0 in | Wt 225.0 lb

## 2017-04-27 DIAGNOSIS — E538 Deficiency of other specified B group vitamins: Secondary | ICD-10-CM | POA: Diagnosis not present

## 2017-04-27 DIAGNOSIS — H6121 Impacted cerumen, right ear: Secondary | ICD-10-CM

## 2017-04-27 DIAGNOSIS — I10 Essential (primary) hypertension: Secondary | ICD-10-CM | POA: Diagnosis not present

## 2017-04-27 DIAGNOSIS — G4733 Obstructive sleep apnea (adult) (pediatric): Secondary | ICD-10-CM

## 2017-04-27 DIAGNOSIS — K5900 Constipation, unspecified: Secondary | ICD-10-CM | POA: Diagnosis not present

## 2017-04-27 DIAGNOSIS — N529 Male erectile dysfunction, unspecified: Secondary | ICD-10-CM

## 2017-04-27 DIAGNOSIS — E559 Vitamin D deficiency, unspecified: Secondary | ICD-10-CM

## 2017-04-27 DIAGNOSIS — Z0000 Encounter for general adult medical examination without abnormal findings: Secondary | ICD-10-CM

## 2017-04-27 MED ORDER — SILDENAFIL CITRATE 100 MG PO TABS: 100.0000 mg | ORAL_TABLET | ORAL | 11 refills | Status: DC | PRN

## 2017-04-27 MED ORDER — LINACLOTIDE 72 MCG PO CAPS: 72.0000 ug | ORAL_CAPSULE | Freq: Every day | ORAL | 5 refills | Status: DC

## 2017-04-27 MED ORDER — FLUOCIN-HYDROQUINONE-TRETINOIN 0.01-4-0.05 % EX CREA: TOPICAL_CREAM | CUTANEOUS | 3 refills | Status: AC

## 2017-04-27 NOTE — Assessment & Plan Note (Signed)
  We discussed age appropriate health related issues, including available/recomended screening tests and vaccinations. We discussed a need for adhering to healthy diet and exercise. Labs were ordered to be later reviewed . All questions were answered.     Pt had a colonsocopy Feb '13 - normal study - due in 2023 Dr Ardis Hughs. Immunization: decline flu shot ; reports he has had tetanus in the last 10 years.

## 2017-04-27 NOTE — Assessment & Plan Note (Signed)
linzess po

## 2017-04-27 NOTE — Assessment & Plan Note (Signed)
Not using a CPAP

## 2017-04-27 NOTE — Assessment & Plan Note (Signed)
On Vit D 

## 2017-04-27 NOTE — Assessment & Plan Note (Signed)
F/u w/Urology 

## 2017-04-27 NOTE — Assessment & Plan Note (Signed)
Labs Risks associated with treatment noncompliance were discussed. Compliance was encouraged.  

## 2017-04-27 NOTE — Progress Notes (Signed)
Subjective:  Patient ID: Corey Oliver, male    DOB: [birth year]  Age: 51 y.o. MRN: [id number]  CC: No chief complaint on file.   HPI Corey Oliver presents for a well exam. PSA was nl recently C/o ED C/o constipation  Outpatient Medications Prior to Visit  Medication Sig Dispense Refill  . Albuterol Sulfate (PROAIR RESPICLICK) 409 (90 BASE) MCG/ACT AEPB Inhale 1-2 puffs into the lungs 4 (four) times daily as needed. 1 each 2  . Armodafinil (NUVIGIL) 150 MG tablet Take 1 tablet (150 mg total) by mouth daily. 30 tablet 5  . Cholecalciferol (VITAMIN D3) 2000 UNITS capsule Take 1 capsule (2,000 Units total) by mouth daily. 100 capsule 3  . Cyanocobalamin (NASCOBAL) 500 MCG/0.1ML SOLN Place 0.1 mLs (500 mcg total) into the nose once a week. 1.3 mL 11  . dexlansoprazole (DEXILANT) 60 MG capsule Take 1 capsule (60 mg total) by mouth daily. 30 capsule 3  . ergocalciferol (VITAMIN D2) 50000 units capsule Take 1 capsule (50,000 Units total) by mouth once a week. 12 capsule 3  . fluticasone (FLONASE) 50 MCG/ACT nasal spray Place 2 sprays into the nose daily. 16 g 10  . guaiFENesin (MUCINEX) 600 MG 12 hr tablet Take 1 tablet (600 mg total) by mouth 2 (two) times daily as needed for cough or to loosen phlegm. 14 tablet 0  . hydrocortisone (ANUSOL-HC) 25 MG suppository PLACE 1 SUPPOSITORY RECTALLY EVERY 12 HOURS. 12 suppository 0  . hydrocortisone-pramoxine (ANALPRAM-HC) 2.5-1 % rectal cream Place 1 application rectally 3 (three) times daily. 30 g 0  . HYZAAR 100-25 MG tablet TAKE 1 TABLET BY MOUTH EVERY DAY 90 tablet 0  . ipratropium (ATROVENT) 0.03 % nasal spray Place 2 sprays into both nostrils 2 (two) times daily. Do not use for more than 5days. 30 mL 0  . ketoconazole (NIZORAL) 2 % shampoo Apply 1 application topically 2 (two) times a week. As directed 120 mL 5  . promethazine-dextromethorphan (PROMETHAZINE-DM) 6.25-15 MG/5ML syrup Take 5 mLs by mouth 3 (three) times daily as needed for cough. 240  mL 0  . SUMAtriptan (IMITREX) 100 MG tablet Take 1 tablet (100 mg total) by mouth once. May repeat in 2 hours if headache persists or recurs. 12 tablet 3   No facility-administered medications prior to visit.     ROS Review of Systems  Constitutional: Negative for appetite change, fatigue and unexpected weight change.  HENT: Negative for congestion, nosebleeds, sneezing, sore throat and trouble swallowing.   Eyes: Negative for itching and visual disturbance.  Respiratory: Negative for cough.   Cardiovascular: Negative for chest pain, palpitations and leg swelling.  Gastrointestinal: Negative for abdominal distention, blood in stool, diarrhea and nausea.  Genitourinary: Negative for frequency and hematuria.  Musculoskeletal: Negative for back pain, gait problem, joint swelling and neck pain.  Skin: Negative for rash.  Neurological: Negative for dizziness, tremors, speech difficulty and weakness.  Psychiatric/Behavioral: Negative for agitation, dysphoric mood, sleep disturbance and suicidal ideas. The patient is not nervous/anxious.     Objective:  BP 126/84 (BP Location: Left Arm, Patient Position: Sitting, Cuff Size: Large)   Pulse 88   Temp 98.2 F (36.8 C) (Oral)   Ht 5\' 6"  (1.676 m)   Wt 225 lb (102.1 kg)   SpO2 100%   BMI 36.32 kg/m   BP Readings from Last 3 Encounters:  04/27/17 126/84  01/28/17 (!) 132/98  06/25/16 112/78    Wt Readings from Last 3 Encounters:  04/27/17 225  lb (102.1 kg)  01/28/17 223 lb (101.2 kg)  06/25/16 229 lb (103.9 kg)    Physical Exam  Constitutional: He is oriented to person, place, and time. He appears well-developed. No distress.  NAD  HENT:  Mouth/Throat: Oropharynx is clear and moist.  Eyes: Conjunctivae are normal. Pupils are equal, round, and reactive to light.  Neck: Normal range of motion. No JVD present. No thyromegaly present.  Cardiovascular: Normal rate, regular rhythm, normal heart sounds and intact distal pulses. Exam  reveals no gallop and no friction rub.  No murmur heard. Pulmonary/Chest: Effort normal and breath sounds normal. No respiratory distress. He has no wheezes. He has no rales. He exhibits no tenderness.  Abdominal: Soft. Bowel sounds are normal. He exhibits no distension and no mass. There is no tenderness. There is no rebound and no guarding.  Musculoskeletal: Normal range of motion. He exhibits no edema or tenderness.  Lymphadenopathy:    He has no cervical adenopathy.  Neurological: He is alert and oriented to person, place, and time. He has normal reflexes. No cranial nerve deficit. He exhibits normal muscle tone. He displays a negative Romberg sign. Coordination and gait normal.  Skin: Skin is warm and dry. No rash noted.  Psychiatric: He has a normal mood and affect. His behavior is normal. Judgment and thought content normal.  dark skin on cheeks Declined rectal - just done by urology Obese Wax in the R ear  Lab Results  Component Value Date   WBC 4.9 12/03/2015   HGB 15.3 12/03/2015   HCT 44.4 12/03/2015   PLT 298.0 12/03/2015   GLUCOSE 90 12/03/2015   CHOL 187 12/03/2015   TRIG 48.0 12/03/2015   HDL 49.10 12/03/2015   LDLCALC 128 (H) 12/03/2015   ALT 14 12/03/2015   AST 19 12/03/2015   NA 137 12/03/2015   K 4.1 12/03/2015   CL 101 12/03/2015   CREATININE 1.09 12/03/2015   BUN 12 12/03/2015   CO2 27 12/03/2015   TSH 2.40 12/03/2015   PSA 1.37 12/03/2015    No results found.  Assessment & Plan:   There are no diagnoses linked to this encounter. I am having Corey Oliver maintain his fluticasone, hydrocortisone, hydrocortisone-pramoxine, Albuterol Sulfate, Armodafinil, Vitamin D3, SUMAtriptan, ketoconazole, ergocalciferol, Cyanocobalamin, dexlansoprazole, ipratropium, guaiFENesin, promethazine-dextromethorphan, and HYZAAR.  No orders of the defined types were placed in this encounter.    Follow-up: No Follow-up on file.  Walker Kehr, MD

## 2017-04-27 NOTE — Assessment & Plan Note (Signed)
Losartan HCT 

## 2017-05-11 ENCOUNTER — Other Ambulatory Visit: Payer: Self-pay | Admitting: Internal Medicine

## 2017-05-20 ENCOUNTER — Telehealth: Payer: Self-pay

## 2017-05-20 NOTE — Telephone Encounter (Signed)
KEY: [id number]

## 2017-05-25 ENCOUNTER — Other Ambulatory Visit: Payer: Self-pay | Admitting: Internal Medicine

## 2017-05-25 ENCOUNTER — Telehealth: Payer: Self-pay | Admitting: Internal Medicine

## 2017-05-25 MED ORDER — OSELTAMIVIR PHOSPHATE 75 MG PO CAPS: 75.0000 mg | ORAL_CAPSULE | Freq: Every day | ORAL | 0 refills | Status: DC

## 2017-05-25 NOTE — Telephone Encounter (Signed)
Corey Oliver states his daughter was tested and was positive for flu. She was given medication today. She was basically asymptomatic. He was told to call the office to get checked. He is not having any symptoms at this time. He did not have the flu shot. He sweats at times- but other than that he has no symptoms. (He has not taken his temperature.) Does he need to schedule an appointment?

## 2017-05-25 NOTE — Telephone Encounter (Signed)
Tamiflu Rx emailed x10 d Thx

## 2017-05-25 NOTE — Telephone Encounter (Signed)
Copied from [place] [id number]. Topic: Quick Communication - See Telephone Encounter >> May 25, 2017  1:11 PM Synthia Innocent wrote: CRM for notification. See Telephone encounter for: Daughter tested positive for Flu. Patient is stomach aches, sweating, does not feel hot. Will have nurse at work check his temp. Would like to know if he should be seen?  05/25/17.

## 2017-05-26 MED ORDER — DEXLANSOPRAZOLE 60 MG PO CPDR: 60.0000 mg | DELAYED_RELEASE_CAPSULE | Freq: Every day | ORAL | 3 refills | Status: DC

## 2017-05-26 NOTE — Telephone Encounter (Signed)
Notified pt MD sent rx to [facility] 

## 2017-06-30 ENCOUNTER — Other Ambulatory Visit: Payer: Self-pay | Admitting: Internal Medicine

## 2017-07-10 IMAGING — NM NM MISC PROCEDURE
3 series · 18 of 18 positions shown · non-contrast
Comparison: none

[Series 1: wbr_r-proj_st rest_(id)_sa · 6.5mm · 6.51mm/px · 6 of 64 frames shown]
[frame 6/64]
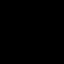
[frame 16/64]
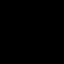
[frame 27/64]
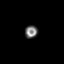
[frame 38/64]
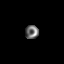
[frame 48/64]
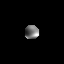
[frame 59/64]
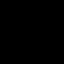

[Series 1: wbr_s-proj_st stress_(id)_sa · 6.5mm · 6.51mm/px · 6 of 512 frames shown (1 of 2)]
[frame 43/512]
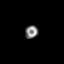
[frame 128/512]
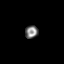
[frame 214/512]
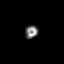
[frame 299/512]
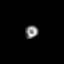
[frame 384/512]
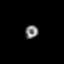
[frame 470/512]
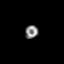

[Series 1: wbr_s-proj_st stress_(id)_sa · 6.5mm · 6.51mm/px · 6 of 64 frames shown (2 of 2)]
[frame 6/64]
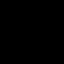
[frame 16/64]
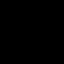
[frame 27/64]
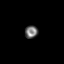
[frame 38/64]
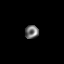
[frame 48/64]
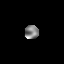
[frame 59/64]
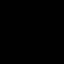

[18 of 18 positions shown; findings below may reference images not displayed]

Canned report from images found in remote index.

Refer to host system for actual result text.

## 2017-07-18 ENCOUNTER — Other Ambulatory Visit: Payer: Self-pay | Admitting: Internal Medicine

## 2017-08-09 DIAGNOSIS — E7800 Pure hypercholesterolemia, unspecified: Secondary | ICD-10-CM | POA: Diagnosis not present

## 2017-08-09 DIAGNOSIS — I129 Hypertensive chronic kidney disease with stage 1 through stage 4 chronic kidney disease, or unspecified chronic kidney disease: Secondary | ICD-10-CM | POA: Diagnosis not present

## 2017-08-09 DIAGNOSIS — N182 Chronic kidney disease, stage 2 (mild): Secondary | ICD-10-CM | POA: Diagnosis not present

## 2017-10-08 ENCOUNTER — Other Ambulatory Visit: Payer: Self-pay | Admitting: Internal Medicine

## 2018-01-09 DIAGNOSIS — H40013 Open angle with borderline findings, low risk, bilateral: Secondary | ICD-10-CM | POA: Diagnosis not present

## 2018-02-03 DIAGNOSIS — I861 Scrotal varices: Secondary | ICD-10-CM | POA: Diagnosis not present

## 2018-03-08 DIAGNOSIS — I129 Hypertensive chronic kidney disease with stage 1 through stage 4 chronic kidney disease, or unspecified chronic kidney disease: Secondary | ICD-10-CM | POA: Diagnosis not present

## 2018-03-08 DIAGNOSIS — N400 Enlarged prostate without lower urinary tract symptoms: Secondary | ICD-10-CM | POA: Diagnosis not present

## 2018-03-08 DIAGNOSIS — R5383 Other fatigue: Secondary | ICD-10-CM | POA: Diagnosis not present

## 2018-03-08 DIAGNOSIS — N182 Chronic kidney disease, stage 2 (mild): Secondary | ICD-10-CM | POA: Diagnosis not present

## 2018-04-06 ENCOUNTER — Ambulatory Visit: Payer: 59 | Admitting: Internal Medicine

## 2018-04-07 ENCOUNTER — Encounter: Payer: Self-pay | Admitting: Family

## 2018-04-07 ENCOUNTER — Ambulatory Visit: Payer: 59 | Admitting: Family

## 2018-04-07 VITALS — BP 128/82 | HR 105 | Temp 98.8°F | Ht 66.0 in | Wt 221.4 lb

## 2018-04-07 DIAGNOSIS — J209 Acute bronchitis, unspecified: Secondary | ICD-10-CM

## 2018-04-07 MED ORDER — HYDROCOD POLST-CPM POLST ER 10-8 MG/5ML PO SUER: 5.0000 mL | Freq: Every evening | ORAL | 0 refills | Status: DC | PRN

## 2018-04-07 MED ORDER — AZITHROMYCIN 250 MG PO TABS: ORAL_TABLET | ORAL | 0 refills | Status: DC

## 2018-04-07 MED ORDER — ALBUTEROL SULFATE 108 (90 BASE) MCG/ACT IN AEPB: 1.0000 | INHALATION_SPRAY | Freq: Four times a day (QID) | RESPIRATORY_TRACT | 2 refills | Status: AC | PRN

## 2018-04-07 NOTE — Progress Notes (Signed)
Corey Oliver is a 52 y.o. male with the following history as recorded in [employer]:  Patient Active Problem List   Diagnosis Date Noted  . Constipation 04/27/2017  . Chest pressure 11/07/2015  . Wheezing 11/07/2015  . B12 deficiency 04/21/2015  . Cerumen impaction 04/21/2015  . Headache 04/21/2015  . Shoulder pain, right 11/06/2014  . Fatigue 11/06/2014  . Well adult exam 02/29/2012  . Obstructive sleep apnea 08/27/2009  . 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 Vitamin D deficiency 08/26/2009  . SHOULDER PAIN, LEFT 06/24/2009  . CYSTIC ACNE 01/30/2008  . Essential hypertension 06/13/2007  . GERD 02/08/2007  . IBS 02/08/2007    Current Outpatient Medications  Medication Sig Dispense Refill  . Albuterol Sulfate (PROAIR RESPICLICK) 811 (90 Base) MCG/ACT AEPB Inhale 1-2 puffs into the lungs 4 (four) times daily as needed. 1 each 2  . Cholecalciferol (VITAMIN D3) 2000 UNITS capsule Take 1 capsule (2,000 Units total) by mouth daily. 100 capsule 3  . dexlansoprazole (DEXILANT) 60 MG capsule Take 1 capsule (60 mg total) by mouth daily. 90 capsule 3  . Fluocin-Hydroquinone-Tretinoin (TRI-LUMA) 0.01-4-0.05 % CREA As directed 30 g 3  . fluticasone (FLONASE) 50 MCG/ACT nasal spray Place 2 sprays into the nose daily. 16 g 10  . hydrocortisone (ANUSOL-HC) 25 MG suppository PLACE 1 SUPPOSITORY RECTALLY EVERY 12 HOURS. 12 suppository 0  . HYZAAR 100-25 MG tablet TAKE 1 TABLET BY MOUTH EVERY DAY 90 tablet 1  . ibuprofen (ADVIL,MOTRIN) 800 MG tablet TAKE 1 TABLET (800 MG TOTAL) BY MOUTH EVERY 6 (SIX) HOURS AS NEEDED.    [name] ipratropium (ATROVENT) 0.03 % nasal spray Place 2 sprays into both nostrils 2 (two) times daily. Do not use for more than 5days. 30 mL 0  . ketoconazole (NIZORAL) 2 % shampoo Apply 1 application topically 2 (two) times a week. As directed 120 mL 5  . promethazine-dextromethorphan (PROMETHAZINE-DM) 6.25-15 MG/5ML syrup Take 5 mLs by mouth 3 (three) times daily as needed for  cough. 240 mL 0  . sildenafil (VIAGRA) 100 MG tablet Take 1 tablet (100 mg total) by mouth as needed for erectile dysfunction. 10 tablet 11  . SUMAtriptan (IMITREX) 100 MG tablet Take 1 tablet (100 mg total) by mouth once. May repeat in 2 hours if headache persists or recurs. 12 tablet 3  . azithromycin (ZITHROMAX) 250 MG tablet 2 tabs po qd x 1 day; 1 tablet per day x 4 days; 6 tablet 0  . chlorpheniramine-HYDROcodone (TUSSIONEX PENNKINETIC ER) 10-8 MG/5ML SUER Take 5 mLs by mouth at bedtime as needed for cough. 70 mL 0   No current facility-administered medications for this visit.     Allergies: Patient has no active allergies.  Past Medical History:  Diagnosis Date  . Cystic acne   . GERD (gastroesophageal reflux disease)   . Hemorrhoids   . Hypertension   . IBS (irritable bowel syndrome)   . Influenza 05/23/12  . URI (upper respiratory infection)     Past Surgical History:  Procedure Laterality Date  . APPENDECTOMY    . arthroscopic surgery left shoulder     for bone spurs jan '11    Family History  Problem Relation Age of Onset  . Colon cancer Neg Hx   . Ovarian cancer Mother     Social History   Tobacco Use  . Smoking status: Never Smoker  . Smokeless tobacco: Never Used  Substance Use Topics  . Alcohol use: No    Alcohol/week: 0.0 standard drinks  Subjective:  Patient presents with concerns for 5 day history of cough/ congestion; started earlier this week; used inhaler yesterday due to chest tightness- offered some benefit; cough keeping awake at night; has had night sweats;     Objective:  Vitals:   04/07/18 0817  BP: 128/82  Pulse: (!) 105  Temp: 98.8 F (37.1 C)  TempSrc: Oral  SpO2: 95%  Weight: 221 lb 6.4 oz (100.4 kg)  Height: 5\' 6"  (1.676 m)    General: Well developed, well nourished, in no acute distress  Skin : Warm and dry.  Head: Normocephalic and atraumatic  Eyes: Sclera and conjunctiva clear; pupils round and reactive to light;  extraocular movements intact  Ears: External normal; canals clear; tympanic membranes normal  Oropharynx: Pink, supple. No suspicious lesions  Neck: Supple without thyromegaly, adenopathy  Lungs: Respirations unlabored; wheezing noted in upper lobes CVS exam: normal rate and regular rhythm.  Neurologic: Alert and oriented; speech intact; face symmetrical; moves all extremities well; CNII-XII intact without focal deficit   Assessment:  1. Acute bronchitis, unspecified organism     Plan:  Rx for Z-pak #1 take as directed, refill on albuterol inhaler given; Rx for Tussionex to use at night; increase fluids, rest and follow-up worse, no better.  Encouraged to schedule CPE with his PCP.   Return in about 3 weeks (around 04/28/2018) for CPE with Dr. Alain Marion.  No orders of the defined types were placed in this encounter.   Requested Prescriptions   Signed Prescriptions Disp Refills  . Albuterol Sulfate (PROAIR RESPICLICK) 354 (90 Base) MCG/ACT AEPB 1 each 2    Sig: Inhale 1-2 puffs into the lungs 4 (four) times daily as needed.  [name] azithromycin (ZITHROMAX) 250 MG tablet 6 tablet 0    Sig: 2 tabs po qd x 1 day; 1 tablet per day x 4 days;  . chlorpheniramine-HYDROcodone (TUSSIONEX PENNKINETIC ER) 10-8 MG/5ML SUER 70 mL 0    Sig: Take 5 mLs by mouth at bedtime as needed for cough.

## 2018-04-28 ENCOUNTER — Other Ambulatory Visit (INDEPENDENT_AMBULATORY_CARE_PROVIDER_SITE_OTHER): Payer: 59

## 2018-04-28 ENCOUNTER — Other Ambulatory Visit: Payer: Self-pay

## 2018-04-28 DIAGNOSIS — E559 Vitamin D deficiency, unspecified: Secondary | ICD-10-CM

## 2018-04-28 DIAGNOSIS — E538 Deficiency of other specified B group vitamins: Secondary | ICD-10-CM

## 2018-04-28 DIAGNOSIS — Z0000 Encounter for general adult medical examination without abnormal findings: Secondary | ICD-10-CM

## 2018-04-28 LAB — CBC WITH DIFFERENTIAL/PLATELET
BASOS ABS: 0 10*3/uL (ref 0.0–0.1)
BASOS PCT: 0.2 % (ref 0.0–3.0)
Eosinophils Absolute: 0.1 10*3/uL (ref 0.0–0.7)
Eosinophils Relative: 2.2 % (ref 0.0–5.0)
HCT: 42.6 % (ref 39.0–52.0)
Hemoglobin: 14.4 g/dL (ref 13.0–17.0)
Lymphocytes Relative: 40.2 % (ref 12.0–46.0)
Lymphs Abs: 1.7 10*3/uL (ref 0.7–4.0)
MCHC: 33.8 g/dL (ref 30.0–36.0)
MCV: 88.3 fl (ref 78.0–100.0)
Monocytes Absolute: 0.5 10*3/uL (ref 0.1–1.0)
Monocytes Relative: 11.2 % (ref 3.0–12.0)
NEUTROS ABS: 2 10*3/uL (ref 1.4–7.7)
Neutrophils Relative %: 46.2 % (ref 43.0–77.0)
Platelets: 292 10*3/uL (ref 150.0–400.0)
RBC: 4.82 Mil/uL (ref 4.22–5.81)
RDW: 13.3 % (ref 11.5–15.5)
WBC: 4.3 10*3/uL (ref 4.0–10.5)

## 2018-04-28 LAB — URINALYSIS
Bilirubin Urine: NEGATIVE
Hgb urine dipstick: NEGATIVE
Ketones, ur: NEGATIVE
Leukocytes, UA: NEGATIVE
Nitrite: NEGATIVE
Specific Gravity, Urine: 1.025 (ref 1.000–1.030)
URINE GLUCOSE: NEGATIVE
Urobilinogen, UA: 0.2 (ref 0.0–1.0)
pH: 6.5 (ref 5.0–8.0)

## 2018-04-28 LAB — HEPATIC FUNCTION PANEL
ALT: 9 U/L (ref 0–53)
AST: 15 U/L (ref 0–37)
Albumin: 4.4 g/dL (ref 3.5–5.2)
Alkaline Phosphatase: 60 U/L (ref 39–117)
BILIRUBIN TOTAL: 0.6 mg/dL (ref 0.2–1.2)
Bilirubin, Direct: 0.1 mg/dL (ref 0.0–0.3)
Total Protein: 7.4 g/dL (ref 6.0–8.3)

## 2018-04-28 LAB — LIPID PANEL
Cholesterol: 151 mg/dL (ref 0–200)
HDL: 41.1 mg/dL (ref >39.00)
LDL Cholesterol: 103 mg/dL — ABNORMAL HIGH (ref 0–99)
NonHDL: 109.95
Total CHOL/HDL Ratio: 4
Triglycerides: 33 mg/dL (ref 0.0–149.0)
VLDL: 6.6 mg/dL (ref 0.0–40.0)

## 2018-04-28 LAB — PSA: PSA: 2.3 ng/mL (ref 0.10–4.00)

## 2018-04-28 LAB — BASIC METABOLIC PANEL
BUN: 14 mg/dL (ref 6–23)
CHLORIDE: 104 meq/L (ref 96–112)
CO2: 26 mEq/L (ref 19–32)
Calcium: 9.1 mg/dL (ref 8.4–10.5)
Creatinine, Ser: 1.02 mg/dL (ref 0.40–1.50)
GFR: 98.37 mL/min (ref >60.00)
Glucose, Bld: 104 mg/dL — ABNORMAL HIGH (ref 70–99)
POTASSIUM: 3.8 meq/L (ref 3.5–5.1)
Sodium: 138 mEq/L (ref 135–145)

## 2018-04-28 LAB — VITAMIN B12: Vitamin B-12: 311 pg/mL (ref 211–911)

## 2018-04-28 LAB — TSH: TSH: 2.33 u[IU]/mL (ref 0.35–4.50)

## 2018-04-28 LAB — VITAMIN D 25 HYDROXY (VIT D DEFICIENCY, FRACTURES): VITD: 27.23 ng/mL — ABNORMAL LOW (ref 30.00–100.00)

## 2018-05-08 ENCOUNTER — Ambulatory Visit (INDEPENDENT_AMBULATORY_CARE_PROVIDER_SITE_OTHER): Payer: 59 | Admitting: Internal Medicine

## 2018-05-08 ENCOUNTER — Encounter: Payer: Self-pay | Admitting: Internal Medicine

## 2018-05-08 ENCOUNTER — Other Ambulatory Visit (INDEPENDENT_AMBULATORY_CARE_PROVIDER_SITE_OTHER): Payer: 59

## 2018-05-08 VITALS — BP 122/84 | HR 93 | Temp 98.6°F | Ht 66.0 in | Wt 225.0 lb

## 2018-05-08 DIAGNOSIS — E538 Deficiency of other specified B group vitamins: Secondary | ICD-10-CM

## 2018-05-08 DIAGNOSIS — I10 Essential (primary) hypertension: Secondary | ICD-10-CM

## 2018-05-08 DIAGNOSIS — Z0000 Encounter for general adult medical examination without abnormal findings: Secondary | ICD-10-CM

## 2018-05-08 DIAGNOSIS — E559 Vitamin D deficiency, unspecified: Secondary | ICD-10-CM

## 2018-05-08 DIAGNOSIS — N529 Male erectile dysfunction, unspecified: Secondary | ICD-10-CM

## 2018-05-08 LAB — TESTOSTERONE: Testosterone: 315.32 ng/dL (ref 300.00–890.00)

## 2018-05-08 MED ORDER — LOSARTAN POTASSIUM 100 MG PO TABS: 100.0000 mg | ORAL_TABLET | Freq: Every day | ORAL | 3 refills | Status: DC

## 2018-05-08 MED ORDER — COZAAR 100 MG PO TABS: 100.0000 mg | ORAL_TABLET | Freq: Every day | ORAL | 11 refills | Status: DC

## 2018-05-08 MED ORDER — TADALAFIL 20 MG PO TABS: 20.0000 mg | ORAL_TABLET | Freq: Every day | ORAL | 5 refills | Status: DC | PRN

## 2018-05-08 MED ORDER — COZAAR 100 MG PO TABS: 100.0000 mg | ORAL_TABLET | Freq: Every day | ORAL | 3 refills | Status: DC

## 2018-05-08 MED ORDER — CHOLECALCIFEROL 100 MCG (4000 UT) PO CAPS: ORAL_CAPSULE | ORAL | 3 refills | Status: DC

## 2018-05-08 NOTE — Assessment & Plan Note (Signed)
D/c Viagra Try Cialis Check testosterone D/c HCTZ

## 2018-05-08 NOTE — Assessment & Plan Note (Signed)
B complex re-start

## 2018-05-08 NOTE — Assessment & Plan Note (Signed)
D/c  Losartan HCT due to ED 12/19 Try Losartan

## 2018-05-08 NOTE — Assessment & Plan Note (Signed)
We discussed age appropriate health related issues, including available/recomended screening tests and vaccinations. We discussed a need for adhering to healthy diet and exercise. Labs  were reviewed/ordered. All questions were answered.  Pt had a colonsocopy Feb '13 - normal study - due in 2023 Dr Ardis Hughs. Immunization: decline flu shot ; reports he has had tetanus in the last 10 years.

## 2018-05-08 NOTE — Patient Instructions (Signed)

## 2018-05-08 NOTE — Assessment & Plan Note (Signed)
Vit D 

## 2018-05-08 NOTE — Progress Notes (Signed)
Subjective:  Patient ID: Corey Oliver, male    DOB: 09-01-1965  Age: 52 y.o. MRN: [id number]  CC: No chief complaint on file.   HPI Corey Oliver presents for a well exam C/o ED;  Libido is ok  Outpatient Medications Prior to Visit  Medication Sig Dispense Refill  . Albuterol Sulfate (PROAIR RESPICLICK) 557 (90 Base) MCG/ACT AEPB Inhale 1-2 puffs into the lungs 4 (four) times daily as needed. 1 each 2  . Cholecalciferol (VITAMIN D3) 2000 UNITS capsule Take 1 capsule (2,000 Units total) by mouth daily. 100 capsule 3  . dexlansoprazole (DEXILANT) 60 MG capsule Take 1 capsule (60 mg total) by mouth daily. 90 capsule 3  . Fluocin-Hydroquinone-Tretinoin (TRI-LUMA) 0.01-4-0.05 % CREA As directed 30 g 3  . fluticasone (FLONASE) 50 MCG/ACT nasal spray Place 2 sprays into the nose daily. 16 g 10  . hydrocortisone (ANUSOL-HC) 25 MG suppository PLACE 1 SUPPOSITORY RECTALLY EVERY 12 HOURS. 12 suppository 0  . HYZAAR 100-25 MG tablet TAKE 1 TABLET BY MOUTH EVERY DAY 90 tablet 1  . ibuprofen (ADVIL,MOTRIN) 800 MG tablet TAKE 1 TABLET (800 MG TOTAL) BY MOUTH EVERY 6 (SIX) HOURS AS NEEDED.    [name] ipratropium (ATROVENT) 0.03 % nasal spray Place 2 sprays into both nostrils 2 (two) times daily. Do not use for more than 5days. 30 mL 0  . ketoconazole (NIZORAL) 2 % shampoo Apply 1 application topically 2 (two) times a week. As directed 120 mL 5  . SUMAtriptan (IMITREX) 100 MG tablet Take 1 tablet (100 mg total) by mouth once. May repeat in 2 hours if headache persists or recurs. 12 tablet 3  . promethazine-dextromethorphan (PROMETHAZINE-DM) 6.25-15 MG/5ML syrup Take 5 mLs by mouth 3 (three) times daily as needed for cough. 240 mL 0  . azithromycin (ZITHROMAX) 250 MG tablet 2 tabs po qd x 1 day; 1 tablet per day x 4 days; 6 tablet 0  . chlorpheniramine-HYDROcodone (TUSSIONEX PENNKINETIC ER) 10-8 MG/5ML SUER Take 5 mLs by mouth at bedtime as needed for cough. 70 mL 0  . sildenafil (VIAGRA) 100 MG tablet Take 1  tablet (100 mg total) by mouth as needed for erectile dysfunction. 10 tablet 11   No facility-administered medications prior to visit.     ROS: Review of Systems  Constitutional: Negative for appetite change, fatigue and unexpected weight change.  HENT: Negative for congestion, nosebleeds, sneezing, sore throat and trouble swallowing.   Eyes: Negative for itching and visual disturbance.  Respiratory: Negative for cough.   Cardiovascular: Negative for chest pain, palpitations and leg swelling.  Gastrointestinal: Negative for abdominal distention, blood in stool, diarrhea and nausea.  Genitourinary: Negative for frequency and hematuria.  Musculoskeletal: Negative for back pain, gait problem, joint swelling and neck pain.  Skin: Negative for rash.  Neurological: Negative for dizziness, tremors, speech difficulty and weakness.  Psychiatric/Behavioral: Negative for agitation, dysphoric mood, sleep disturbance and suicidal ideas. The patient is not nervous/anxious.     Objective:  BP 122/84 (BP Location: Left Arm, Patient Position: Sitting, Cuff Size: Large)   Pulse 93   Temp 98.6 F (37 C) (Oral)   Ht 5\' 6"  (1.676 m)   Wt 225 lb (102.1 kg)   SpO2 96%   BMI 36.32 kg/m   BP Readings from Last 3 Encounters:  05/08/18 122/84  04/07/18 128/82  04/27/17 126/84    Wt Readings from Last 3 Encounters:  05/08/18 225 lb (102.1 kg)  04/07/18 221 lb 6.4 oz (100.4 kg)  04/27/17 225 lb (102.1 kg)    Physical Exam Constitutional:      General: He is not in acute distress.    Appearance: He is well-developed.     Comments: NAD  Eyes:     Conjunctiva/sclera: Conjunctivae normal.     Pupils: Pupils are equal, round, and reactive to light.  Neck:     Musculoskeletal: Normal range of motion.     Thyroid: No thyromegaly.     Vascular: No JVD.  Cardiovascular:     Rate and Rhythm: Normal rate and regular rhythm.     Heart sounds: Normal heart sounds. No murmur. No friction rub. No  gallop.   Pulmonary:     Effort: Pulmonary effort is normal. No respiratory distress.     Breath sounds: Normal breath sounds. No wheezing or rales.  Chest:     Chest wall: No tenderness.  Abdominal:     General: Bowel sounds are normal. There is no distension.     Palpations: Abdomen is soft. There is no mass.     Tenderness: There is no abdominal tenderness. There is no guarding or rebound.  Genitourinary:    Rectum: Normal. Guaiac result negative.  Musculoskeletal: Normal range of motion.        General: No tenderness.  Lymphadenopathy:     Cervical: No cervical adenopathy.  Skin:    General: Skin is warm and dry.     Findings: No rash.  Neurological:     Mental Status: He is alert and oriented to person, place, and time.     Cranial Nerves: No cranial nerve deficit.     Motor: No abnormal muscle tone.     Coordination: Coordination normal.     Gait: Gait normal.     Deep Tendon Reflexes: Reflexes are normal and symmetric.  Psychiatric:        Behavior: Behavior normal.        Thought Content: Thought content normal.        Judgment: Judgment normal.   B testes w/o atrophy Prostate 1+  Procedure: EKG Indication: dyslipidemia Impression: NSR. No acute changes.   Lab Results  Component Value Date   WBC 4.3 04/28/2018   HGB 14.4 04/28/2018   HCT 42.6 04/28/2018   PLT 292.0 04/28/2018   GLUCOSE 104 (H) 04/28/2018   CHOL 151 04/28/2018   TRIG 33.0 04/28/2018   HDL 41.10 04/28/2018   LDLCALC 103 (H) 04/28/2018   ALT 9 04/28/2018   AST 15 04/28/2018   NA 138 04/28/2018   K 3.8 04/28/2018   CL 104 04/28/2018   CREATININE 1.02 04/28/2018   BUN 14 04/28/2018   CO2 26 04/28/2018   TSH 2.33 04/28/2018   PSA 2.30 04/28/2018    No results found.  Assessment & Plan:   There are no diagnoses linked to this encounter.   No orders of the defined types were placed in this encounter.    Follow-up: No follow-ups on file.  Walker Kehr, MD

## 2018-05-25 ENCOUNTER — Telehealth: Payer: Self-pay | Admitting: Internal Medicine

## 2018-05-25 NOTE — Telephone Encounter (Signed)
Please advise 

## 2018-05-25 NOTE — Telephone Encounter (Signed)
Copied from [place] [id number]. Topic: Quick Communication - See Telephone Encounter >> May 25, 2018  1:23 PM Blase Mess A wrote: CRM for notification. See Telephone encounter for: 05/25/18.  Patient is calling regarding his past lab results. He received a letter that his testosterone is low.  At this time, he is wanting to know what Dr. Alain Marion would like him to do.  Please advise [phone]

## 2018-05-26 NOTE — Telephone Encounter (Signed)
My letter was attached to his lab work.  Please confirm with the patient that he received it.  He needs to follow instructions for 3 months and then see me with the another testosterone test.  Thank you,   Fue, Your testosterone is on the "low end of normal range". There are natural ways to boost your testosterone:  1. Lose Weight If you're overweight, shedding the excess pounds may increase your testosterone levels, according to multiple research. Overweight men are more likely to have low testosterone levels to begin with, so this is an important trick to increase your body's testosterone production when you need it most.   2. Strength Training  Strength training is also known to boost testosterone levels, provided you are doing so intensely enough. When strength training to boost testosterone, you'll want to increase the weight and lower your number of reps, and then focus on exercises that work a large number of muscles.  3. Optimize Your Vitamin D Levels Vitamin D, a steroid hormone, is essential for the healthy development of the nucleus of the sperm cell, and helps maintain semen quality and sperm count. Vitamin D also increases levels of testosterone, which may boost libido. In one study, overweight men who were given vitamin D supplements had a significant increase in testosterone levels after one year.  4. Reduce Stress When you're under a lot of stress, your body releases high levels of the stress hormone cortisol. This hormone actually blocks the effects of testosterone, presumably because, from a biological standpoint, testosterone-associated behaviors (mating, competing, aggression) may have lowered your chances of survival in an emergency (hence, the "fight or flight" response is dominant, courtesy of cortisol).  5. Limit or Eliminate Sugar from Your Diet Testosterone levels decrease after you eat sugar, which is likely because the sugar leads to a high insulin level, another  factor leading to low testosterone.  6. Eat Healthy Fats By healthy, this means not only mon- and polyunsaturated fats, like that found in avocadoes and nuts, but also saturated, as these are essential for building testosterone. Research shows that a diet with less than 40 percent of energy as fat (and that mainly from animal sources, i.e. saturated) lead to a decrease in testosterone levels.  It's important to understand that your body requires saturated fats from animal and vegetable sources (such as meat, dairy, certain oils, and tropical plants like coconut) for optimal functioning, and if you neglect this important food group in favor of sugar, grains and other starchy carbs, your health and weight are almost guaranteed to suffer. Examples of healthy fats you can eat more of to give your testosterone levels a boost include:  Olives and Olive oil Coconuts and coconut oil Butter made from organic milk Raw nuts, such as, almonds or pecans Eggs Avocados Meats Palm oil Unheated organic nut oils  7. "Testosterone boosters" containing Vitamin D-3, Niacin, Vitamin B-6, Vitamin B-12, Magnesium, Zinc, Selenium, D-Aspartic Acid, Fenugreed Seed Extract, Oystershell, [place] Extract, [place] Ginseng may be helpful as well.  Thanks, AP

## 2018-05-31 ENCOUNTER — Other Ambulatory Visit: Payer: Self-pay

## 2018-05-31 DIAGNOSIS — N529 Male erectile dysfunction, unspecified: Secondary | ICD-10-CM

## 2018-05-31 NOTE — Telephone Encounter (Signed)
Pt did get complete letter but would like a medication sent in.  Pt also stated that the Cozaar is causing him to go to the bathroom more frequently. And would like to know is he can either go back on his old medication or another medication. Please advise.

## 2018-06-02 NOTE — Telephone Encounter (Signed)
Pt.notified

## 2018-06-02 NOTE — Telephone Encounter (Signed)
It is not one of the common listed side effects for Losartan. Try to take it at night. Thx

## 2018-06-18 DIAGNOSIS — J018 Other acute sinusitis: Secondary | ICD-10-CM | POA: Diagnosis not present

## 2018-06-19 ENCOUNTER — Telehealth: Payer: Self-pay | Admitting: Internal Medicine

## 2018-06-19 ENCOUNTER — Encounter: Payer: Self-pay | Admitting: Internal Medicine

## 2018-06-19 ENCOUNTER — Ambulatory Visit: Payer: 59 | Admitting: Internal Medicine

## 2018-06-19 DIAGNOSIS — K219 Gastro-esophageal reflux disease without esophagitis: Secondary | ICD-10-CM

## 2018-06-19 DIAGNOSIS — I10 Essential (primary) hypertension: Secondary | ICD-10-CM | POA: Diagnosis not present

## 2018-06-19 DIAGNOSIS — J0100 Acute maxillary sinusitis, unspecified: Secondary | ICD-10-CM | POA: Diagnosis not present

## 2018-06-19 DIAGNOSIS — Z0279 Encounter for issue of other medical certificate: Secondary | ICD-10-CM

## 2018-06-19 DIAGNOSIS — J019 Acute sinusitis, unspecified: Secondary | ICD-10-CM | POA: Insufficient documentation

## 2018-06-19 MED ORDER — DEXLANSOPRAZOLE 60 MG PO CPDR: 60.0000 mg | DELAYED_RELEASE_CAPSULE | Freq: Every day | ORAL | 3 refills | Status: DC

## 2018-06-19 MED ORDER — LOSARTAN POTASSIUM-HCTZ 100-25 MG PO TABS: 1.0000 | ORAL_TABLET | Freq: Every day | ORAL | 3 refills | Status: DC

## 2018-06-19 NOTE — Assessment & Plan Note (Signed)
Losartan HCT - re-start  Losartan did not help

## 2018-06-19 NOTE — Patient Instructions (Signed)
Form for work

## 2018-06-19 NOTE — Telephone Encounter (Signed)
FMLA forms have been completed & signed by providers - Return to work date 06/27/18. Faxed to [phone], Copy sent to scan &charged for.   Patient informed and has original copy.

## 2018-06-19 NOTE — Progress Notes (Signed)
Subjective:  Patient ID: Corey Oliver, male    DOB: 07/09/1965  Age: 53 y.o. MRN: [id number]  CC: No chief complaint on file.   HPI Corey Oliver presents for severe sinusitis sx's - on Amox/clav abx F/u HTN - wants to go back to Hyzaar. F/u GERD C/o fatigue  Outpatient Medications Prior to Visit  Medication Sig Dispense Refill  . Albuterol Sulfate (PROAIR RESPICLICK) 992 (90 Base) MCG/ACT AEPB Inhale 1-2 puffs into the lungs 4 (four) times daily as needed. 1 each 2  . amoxicillin-clavulanate (AUGMENTIN) 875-125 MG tablet Take by mouth.    . Cholecalciferol (HM VITAMIN D3) 100 MCG (4000 UT) CAPS 1 po qd 90 capsule 3  . COZAAR 100 MG tablet Take 1 tablet (100 mg total) by mouth daily. 90 tablet 3  . dexlansoprazole (DEXILANT) 60 MG capsule Take 1 capsule (60 mg total) by mouth daily. 90 capsule 3  . dextromethorphan-guaiFENesin (MUCINEX DM) 30-600 MG 12hr tablet Take by mouth.    . Fluocin-Hydroquinone-Tretinoin (TRI-LUMA) 0.01-4-0.05 % CREA As directed 30 g 3  . fluticasone (FLONASE) 50 MCG/ACT nasal spray Place 2 sprays into the nose daily. 16 g 10  . ibuprofen (ADVIL,MOTRIN) 800 MG tablet TAKE 1 TABLET (800 MG TOTAL) BY MOUTH EVERY 6 (SIX) HOURS AS NEEDED.    [name] ipratropium (ATROVENT) 0.03 % nasal spray Place 2 sprays into both nostrils 2 (two) times daily. Do not use for more than 5days. 30 mL 0  . SUMAtriptan (IMITREX) 100 MG tablet Take 1 tablet (100 mg total) by mouth once. May repeat in 2 hours if headache persists or recurs. 12 tablet 3  . tadalafil (CIALIS) 20 MG tablet Take 1 tablet (20 mg total) by mouth daily as needed for erectile dysfunction. 10 tablet 5   No facility-administered medications prior to visit.     ROS: Review of Systems  Constitutional: Positive for fatigue. Negative for appetite change and unexpected weight change.  HENT: Positive for postnasal drip, rhinorrhea, sinus pressure and sinus pain. Negative for congestion, nosebleeds, sneezing, sore throat  and trouble swallowing.   Eyes: Negative for itching and visual disturbance.  Respiratory: Negative for cough.   Cardiovascular: Negative for chest pain, palpitations and leg swelling.  Gastrointestinal: Negative for abdominal distention, blood in stool, diarrhea and nausea.  Genitourinary: Negative for frequency and hematuria.  Musculoskeletal: Negative for back pain, gait problem, joint swelling and neck pain.  Skin: Negative for rash.  Neurological: Negative for dizziness, tremors, speech difficulty and weakness.  Psychiatric/Behavioral: Negative for agitation, dysphoric mood and sleep disturbance. The patient is not nervous/anxious.     Objective:  BP (!) 152/90 (BP Location: Left Arm, Patient Position: Sitting, Cuff Size: Large)   Pulse (!) 109   Temp 98.4 F (36.9 C) (Oral)   Ht 6' (1.829 m)   Wt 222 lb (100.7 kg)   SpO2 96%   BMI 30.11 kg/m   BP Readings from Last 3 Encounters:  06/19/18 (!) 152/90  05/08/18 122/84  04/07/18 128/82    Wt Readings from Last 3 Encounters:  06/19/18 222 lb (100.7 kg)  05/08/18 225 lb (102.1 kg)  04/07/18 221 lb 6.4 oz (100.4 kg)    Physical Exam Constitutional:      General: He is not in acute distress.    Appearance: He is well-developed.     Comments: NAD  Eyes:     Conjunctiva/sclera: Conjunctivae normal.     Pupils: Pupils are equal, round, and reactive to light.  Neck:     Musculoskeletal: Normal range of motion.     Thyroid: No thyromegaly.     Vascular: No JVD.  Cardiovascular:     Rate and Rhythm: Normal rate and regular rhythm.     Heart sounds: Normal heart sounds. No murmur. No friction rub. No gallop.   Pulmonary:     Effort: Pulmonary effort is normal. No respiratory distress.     Breath sounds: Normal breath sounds. No wheezing or rales.  Chest:     Chest wall: No tenderness.  Abdominal:     General: Bowel sounds are normal. There is no distension.     Palpations: Abdomen is soft. There is no mass.      Tenderness: There is no abdominal tenderness. There is no guarding or rebound.  Musculoskeletal: Normal range of motion.        General: No tenderness.  Lymphadenopathy:     Cervical: No cervical adenopathy.  Skin:    General: Skin is warm and dry.     Findings: No rash.  Neurological:     Mental Status: He is alert and oriented to person, place, and time.     Cranial Nerves: No cranial nerve deficit.     Motor: No abnormal muscle tone.     Coordination: Coordination normal.     Gait: Gait normal.     Deep Tendon Reflexes: Reflexes are normal and symmetric.  Psychiatric:        Behavior: Behavior normal.        Thought Content: Thought content normal.        Judgment: Judgment normal.   eryth nasal lining Looks tired  Lab Results  Component Value Date   WBC 4.3 04/28/2018   HGB 14.4 04/28/2018   HCT 42.6 04/28/2018   PLT 292.0 04/28/2018   GLUCOSE 104 (H) 04/28/2018   CHOL 151 04/28/2018   TRIG 33.0 04/28/2018   HDL 41.10 04/28/2018   LDLCALC 103 (H) 04/28/2018   ALT 9 04/28/2018   AST 15 04/28/2018   NA 138 04/28/2018   K 3.8 04/28/2018   CL 104 04/28/2018   CREATININE 1.02 04/28/2018   BUN 14 04/28/2018   CO2 26 04/28/2018   TSH 2.33 04/28/2018   PSA 2.30 04/28/2018    No results found.  Assessment & Plan:   There are no diagnoses linked to this encounter.   No orders of the defined types were placed in this encounter.    Follow-up: No follow-ups on file.  Walker Kehr, MD

## 2018-06-19 NOTE — Assessment & Plan Note (Signed)
On Amoxicillin To work 06/27/18 if ok

## 2018-06-19 NOTE — Assessment & Plan Note (Signed)
Dexilant renewed

## 2018-06-26 ENCOUNTER — Other Ambulatory Visit (INDEPENDENT_AMBULATORY_CARE_PROVIDER_SITE_OTHER): Payer: 59

## 2018-06-26 DIAGNOSIS — N529 Male erectile dysfunction, unspecified: Secondary | ICD-10-CM | POA: Diagnosis not present

## 2018-06-26 LAB — TESTOSTERONE: Testosterone: 384.82 ng/dL (ref 300.00–890.00)

## 2018-08-31 ENCOUNTER — Other Ambulatory Visit: Payer: Self-pay

## 2018-08-31 MED ORDER — IBUPROFEN 800 MG PO TABS: ORAL_TABLET | ORAL | 1 refills | Status: DC

## 2018-08-31 NOTE — Telephone Encounter (Signed)
Last filled 2016, please advise

## 2018-09-22 DIAGNOSIS — I861 Scrotal varices: Secondary | ICD-10-CM | POA: Diagnosis not present

## 2018-09-22 DIAGNOSIS — N50812 Left testicular pain: Secondary | ICD-10-CM | POA: Diagnosis not present

## 2018-11-07 ENCOUNTER — Other Ambulatory Visit: Payer: Self-pay | Admitting: Internal Medicine

## 2018-12-18 ENCOUNTER — Other Ambulatory Visit: Payer: Self-pay

## 2018-12-18 ENCOUNTER — Encounter (HOSPITAL_COMMUNITY): Payer: Self-pay | Admitting: Emergency Medicine

## 2018-12-18 ENCOUNTER — Emergency Department (HOSPITAL_COMMUNITY)
Admission: EM | Admit: 2018-12-18 | Discharge: 2018-12-18 | Disposition: A | Discharge status: Home / Self Care | Payer: 59 | Attending: Emergency Medicine | Admitting: Emergency Medicine

## 2018-12-18 DIAGNOSIS — R519 Headache, unspecified: Secondary | ICD-10-CM

## 2018-12-18 DIAGNOSIS — Z79899 Other long term (current) drug therapy: Secondary | ICD-10-CM | POA: Diagnosis not present

## 2018-12-18 DIAGNOSIS — I10 Essential (primary) hypertension: Secondary | ICD-10-CM | POA: Diagnosis not present

## 2018-12-18 DIAGNOSIS — R51 Headache: Secondary | ICD-10-CM | POA: Insufficient documentation

## 2018-12-18 MED ORDER — KETOROLAC TROMETHAMINE 15 MG/ML IJ SOLN
15.0000 mg | Freq: Once | INTRAMUSCULAR | Status: AC
  Administered 2018-12-18: 10:00:00 15 mg via INTRAVENOUS
  Filled 2018-12-18: qty 1

## 2018-12-18 MED ORDER — SODIUM CHLORIDE 0.9 % IV BOLUS
500.0000 mL | Freq: Once | INTRAVENOUS | Status: AC
  Administered 2018-12-18: 10:00:00 500 mL via INTRAVENOUS

## 2018-12-18 MED ORDER — ACETAMINOPHEN 325 MG PO TABS: 650.0000 mg | ORAL_TABLET | Freq: Four times a day (QID) | ORAL | 0 refills | Status: AC | PRN

## 2018-12-18 MED ORDER — METOCLOPRAMIDE HCL 5 MG/ML IJ SOLN
10.0000 mg | Freq: Once | INTRAMUSCULAR | Status: AC
  Administered 2018-12-18: 10 mg via INTRAVENOUS
  Filled 2018-12-18: qty 2

## 2018-12-18 NOTE — ED Triage Notes (Addendum)
C/o headache last pm took extra strength exceden at work this am. States he took his blood pressure this am and it was elevated.  States he hasn't taken his blood pressure for 1 week and restarted his medication last pm

## 2018-12-18 NOTE — ED Provider Notes (Signed)
[facility] Provider Note   CSN: [id number] Arrival date & time: 12/18/18  0849    History   Chief Complaint Chief Complaint  Patient presents with  . Headache    HPI LORCAN SHELP is a 53 y.o. male.     HPI  53 year old comes in a chief complaint of headache. He has history of hypertension and migraines.  He reports that he started having this current headache yesterday evening.  Headache at its worst is 7 out of 10 and located in the frontal aspect, radiating toward the vertex.  He describes the headache as sharp pain that is different than his typical migraines.  The pain has been constant with waxing and waning intensity.  He has no associated neck pain and denies any vision change, focal numbness, weakness, seizure-like activity, confusion.  Review of system is also negative for any fevers or chills.   Past Medical History:  Diagnosis Date  . Cystic acne   . GERD (gastroesophageal reflux disease)   . Hemorrhoids   . Hypertension   . IBS (irritable bowel syndrome)   . Influenza 05/23/12  . URI (upper respiratory infection)     Patient Active Problem List   Diagnosis Date Noted  . Acute sinusitis 06/19/2018  . Constipation 04/27/2017  . Chest pressure 11/07/2015  . Wheezing 11/07/2015  . B12 deficiency 04/21/2015  . Cerumen impaction 04/21/2015  . Headache 04/21/2015  . Shoulder pain, right 11/06/2014  . Fatigue 11/06/2014  . Well adult exam 02/29/2012  . Obstructive sleep apnea 08/27/2009  . Erectile dysfunction 08/27/2009  . Vitamin D deficiency 08/26/2009  . SHOULDER PAIN, LEFT 06/24/2009  . CYSTIC ACNE 01/30/2008  . Essential hypertension 06/13/2007  . GERD 02/08/2007  . IBS 02/08/2007    Past Surgical History:  Procedure Laterality Date  . APPENDECTOMY    . arthroscopic surgery left shoulder     for bone spurs jan '11        Home Medications    Prior to Admission medications   Medication Sig Start  Date End Date Taking? Authorizing Provider  Albuterol Sulfate (PROAIR RESPICLICK) 938 (90 Base) MCG/ACT AEPB Inhale 1-2 puffs into the lungs 4 (four) times daily as needed. 04/07/18  Yes Marrian Salvage, FNP  Cholecalciferol (VITAMIN D3) 25 MCG (1000 UT) CAPS Take 2,000 Units by mouth daily.   Yes [provider]  dexlansoprazole (DEXILANT) 60 MG capsule Take 1 capsule (60 mg total) by mouth daily. Patient taking differently: Take 60 mg by mouth as needed.  06/19/18  Yes Plotnikov, Evie Lacks, MD  fluticasone (FLONASE) 50 MCG/ACT nasal spray Place 2 sprays into the nose daily. Patient taking differently: Place 2 sprays into the nose as needed for allergies.  03/15/13  Yes Norins, Heinz Knuckles, MD  ibuprofen (ADVIL) 800 MG tablet TAKE 1 TABLET BY MOUTH EVERY 6 HOURS AS NEEDED Patient taking differently: Take 800 mg by mouth every 6 (six) hours as needed for mild pain or moderate pain. TAKE 1 TABLET BY MOUTH EVERY 6 HOURS AS NEEDED 11/07/18  Yes Plotnikov, Evie Lacks, MD  losartan-hydrochlorothiazide (HYZAAR) 100-25 MG tablet Take 1 tablet by mouth daily. 06/19/18 06/19/19 Yes Plotnikov, Evie Lacks, MD  SUMAtriptan (IMITREX) 100 MG tablet Take 1 tablet (100 mg total) by mouth once. May repeat in 2 hours if headache persists or recurs. Patient taking differently: Take 100 mg by mouth every 2 (two) hours as needed for migraine. May repeat in 2 hours if headache  persists or recurs. 04/21/15  Yes Plotnikov, Evie Lacks, MD  Cholecalciferol (HM VITAMIN D3) 100 MCG (4000 UT) CAPS 1 po qd Patient not taking: Reported on 12/18/2018 05/08/18   Plotnikov, Evie Lacks, MD  Fluocin-Hydroquinone-Tretinoin (TRI-LUMA) 0.01-4-0.05 % CREA As directed Patient not taking: Reported on 12/18/2018 04/27/17   Plotnikov, Evie Lacks, MD  ipratropium (ATROVENT) 0.03 % nasal spray Place 2 sprays into both nostrils 2 (two) times daily. Do not use for more than 5days. Patient not taking: Reported on 12/18/2018 01/28/17   Nche,  Charlene Brooke, NP  tadalafil (CIALIS) 20 MG tablet Take 1 tablet (20 mg total) by mouth daily as needed for erectile dysfunction. Patient not taking: Reported on 12/18/2018 05/08/18 06/07/18  Plotnikov, Evie Lacks, MD  vardenafil (LEVITRA) 20 MG tablet Take 1 tablet (20 mg total) by mouth daily as needed for erectile dysfunction. 01/20/12 08/25/14  Norins, Heinz Knuckles, MD    Family History Family History  Problem Relation Age of Onset  . Ovarian cancer Mother   . Colon cancer Neg Hx     Social History Social History   Tobacco Use  . Smoking status: Never Smoker  . Smokeless tobacco: Never Used  Substance Use Topics  . Alcohol use: No    Alcohol/week: 0.0 standard drinks  . Drug use: No     Allergies   Patient has no known allergies.   Review of Systems Review of Systems  Constitutional: Negative for activity change.  Respiratory: Negative for shortness of breath.   Cardiovascular: Negative for chest pain.  Gastrointestinal: Negative for nausea and vomiting.  Musculoskeletal: Negative for neck stiffness.  Neurological: Positive for headaches. Negative for dizziness, tremors, seizures, syncope, speech difficulty, weakness, light-headedness and numbness.  All other systems reviewed and are negative.    Physical Exam Updated Vital Signs BP (!) 165/105   Pulse 84   Resp 16   Ht 5\' 7"  (1.702 m)   Wt 93 kg   SpO2 98%   BMI 32.11 kg/m   Physical Exam Vitals signs and nursing note reviewed.  HENT:     Head: Atraumatic.  Eyes:     General: No visual field deficit. Neck:     Musculoskeletal: Neck supple.     Comments: No meningismus Cardiovascular:     Rate and Rhythm: Normal rate.  Pulmonary:     Effort: Pulmonary effort is normal.  Skin:    General: Skin is warm.  Neurological:     Mental Status: He is alert and oriented to person, place, and time.     GCS: GCS eye subscore is 4. GCS verbal subscore is 5. GCS motor subscore is 6.     Cranial Nerves: No cranial  nerve deficit, dysarthria or facial asymmetry.     Sensory: No sensory deficit.     Motor: No weakness.      ED Treatments / Results  Labs (all labs ordered are listed, but only abnormal results are displayed) Labs Reviewed - No data to display  EKG None  Radiology No results found.  Procedures Procedures (including critical care time)  Medications Ordered in ED Medications  metoCLOPramide (REGLAN) injection 10 mg (10 mg Intravenous Given 12/18/18 0946)  ketorolac (TORADOL) 15 MG/ML injection 15 mg (15 mg Intravenous Given 12/18/18 0953)  sodium chloride 0.9 % bolus 500 mL (0 mLs Intravenous Stopped 12/18/18 1047)     Initial Impression / Assessment and Plan / ED Course  I have reviewed the triage vital signs and the nursing notes.  Pertinent labs & imaging results that were available during my care of the patient were reviewed by me and considered in my medical decision making (see chart for details).  Clinical Course as of Dec 17 1056  Mon Dec 18, 2018  1056 Patient reassessed. Pt is comfortable at this time. Headache improved dramatically. Results of the workup discussed. Strict ER return precautions discussed. Follow up instruction discussed, and pt agrees with the plan and is comfortable with it.   [AN]    Clinical Course User Index [AN] Varney Biles, MD      53 year old male comes in a chief complaint of headaches.  He is noted to have elevated blood pressure.  Patient reports that he was out of his BP medications and started taking it today.  He is having no meningismus and no focal neurologic deficits.  Headache is not typical for his migraine, but he does not have any features consistent with subarachnoid hemorrhage either.  Patient does not have the worst headache of his life and he denies heavy smoking, substance abuse or family history of brain aneurysm or bleed.  At this time we do not think he is having intracranial hemorrhage.  BP could be related to  elevated blood pressure but there is no associated encephalopathy.  We will give him Toradol and Reglan and reassess.  Final Clinical Impressions(s) / ED Diagnoses   Final diagnoses:  Essential hypertension  Bad headache    ED Discharge Orders    None       Varney Biles, MD 12/18/18 1058

## 2018-12-18 NOTE — Discharge Instructions (Signed)
Take Tylenol every 6 hours for the next 2 or 3 days for your headaches.  Ensure that you are compliant with your blood pressure medications.  Please return to the ER if the headache gets severe and in not improving, you have associated new one sided numbness, tingling, weakness or confusion, seizures, poor balance or poor vision.

## 2018-12-21 ENCOUNTER — Ambulatory Visit (INDEPENDENT_AMBULATORY_CARE_PROVIDER_SITE_OTHER): Payer: 59 | Admitting: Internal Medicine

## 2018-12-21 ENCOUNTER — Other Ambulatory Visit: Payer: Self-pay

## 2018-12-21 ENCOUNTER — Encounter: Payer: Self-pay | Admitting: Internal Medicine

## 2018-12-21 ENCOUNTER — Inpatient Hospital Stay: Payer: 59 | Admitting: Internal Medicine

## 2018-12-21 DIAGNOSIS — E538 Deficiency of other specified B group vitamins: Secondary | ICD-10-CM | POA: Diagnosis not present

## 2018-12-21 DIAGNOSIS — E559 Vitamin D deficiency, unspecified: Secondary | ICD-10-CM | POA: Diagnosis not present

## 2018-12-21 DIAGNOSIS — N529 Male erectile dysfunction, unspecified: Secondary | ICD-10-CM

## 2018-12-21 DIAGNOSIS — I10 Essential (primary) hypertension: Secondary | ICD-10-CM

## 2018-12-21 MED ORDER — OLMESARTAN-AMLODIPINE-HCTZ 40-5-12.5 MG PO TABS: 1.0000 | ORAL_TABLET | ORAL | 3 refills | Status: DC

## 2018-12-21 MED ORDER — VARDENAFIL HCL 20 MG PO TABS: 20.0000 mg | ORAL_TABLET | Freq: Every day | ORAL | 6 refills | Status: DC | PRN

## 2018-12-21 MED ORDER — TRIBENZOR 40-10-25 MG PO TABS: 1.0000 | ORAL_TABLET | ORAL | 3 refills | Status: DC

## 2018-12-21 NOTE — Assessment & Plan Note (Signed)
Levitra prn L-Arginine (Arginmax) daily

## 2018-12-21 NOTE — Patient Instructions (Addendum)
L-Arginine (Arginmax) daily  There are natural ways to boost your testosterone:  1. Lose Weight If you're overweight, shedding the excess pounds may increase your testosterone levels, according to multiple research. Overweight men are more likely to have low testosterone levels to begin with, so this is an important trick to increase your body's testosterone production when you need it most.   2. Strength Training    Strength training is also known to boost testosterone levels, provided you are doing so intensely enough. When strength training to boost testosterone, you'll want to increase the weight and lower your number of reps, and then focus on exercises that work a large number of muscles.  3. Optimize Your Vitamin D Levels Vitamin D, a steroid hormone, is essential for the healthy development of the nucleus of the sperm cell, and helps maintain semen quality and sperm count. Vitamin D also increases levels of testosterone, which may boost libido. In one study, overweight men who were given vitamin D supplements had a significant increase in testosterone levels after one year.  4. Reduce Stress When you're under a lot of stress, your body releases high levels of the stress hormone cortisol. This hormone actually blocks the effects of testosterone, presumably because, from a biological standpoint, testosterone-associated behaviors (mating, competing, aggression) may have lowered your chances of survival in an emergency (hence, the "fight or flight" response is dominant, courtesy of cortisol).  5. Limit or Eliminate Sugar from Your Diet Testosterone levels decrease after you eat sugar, which is likely because the sugar leads to a high insulin level, another factor leading to low testosterone.  6. Eat Healthy Fats By healthy, this means not only mon- and polyunsaturated fats, like that found in avocadoes and nuts, but also saturated, as these are essential for building testosterone. Research  shows that a diet with less than 40 percent of energy as fat (and that mainly from animal sources, i.e. saturated) lead to a decrease in testosterone levels.  It's important to understand that your body requires saturated fats from animal and vegetable sources (such as meat, dairy, certain oils, and tropical plants like coconut) for optimal functioning, and if you neglect this important food group in favor of sugar, grains and other starchy carbs, your health and weight are almost guaranteed to suffer. Examples of healthy fats you can eat more of to give your testosterone levels a boost include:  Olives and Olive oil  Coconuts and coconut oil Butter made from organic milk  Raw nuts, such as, almonds or pecans Eggs Avocados   Meats Palm oil Unheated organic nut oils   7. "Testosterone boosters" containing Vitamin D-3, Niacin, Vitamin B-6, Vitamin B-12, Magnesium, Zinc, Selenium, D-Aspartic Acid, Fenugreed Seed Extract, Oystershell, [place] Extract, [place] Ginseng may be helpful as well.   These suggestions will probably help you to improve your metabolism if you are not overweight and to lose weight if you are overweight: 1.  Reduce your consumption of sugars and starches.  Eliminate high fructose corn syrup from your diet.  Reduce your consumption of processed foods.  For desserts try to have seasonal fruits, berries, nuts, cheeses or dark chocolate with more than 70% cacao. 2.  Do not snack 3.  You do not have to eat breakfast.  If you choose to have breakfast-eat plain greek yogurt, eggs, oatmeal (without sugar) 4.  Drink water, freshly brewed unsweetened tea (green, black or herbal) or coffee.  Do not drink sodas including diet sodas , juices, beverages sweetened with  artificial sweeteners. 5.  Reduce your consumption of refined grains. 6.  Avoid protein drinks such as Optifast, Slim fast etc. Eat chicken, fish, meat, dairy and beans for your sources of protein 7.  Natural unprocessed fats like  cold pressed virgin olive oil, butter, coconut oil are good for you.  Eat avocados 8.  Increase your consumption of fiber.  Fruits, berries, vegetables, whole grains, flaxseeds, Chia seeds, beans, popcorn, nuts, oatmeal are good sources of fiber 9.  Use vinegar in your diet, i.e. apple cider vinegar, red wine or balsamic vinegar 10.  You can try fasting.  For example you can skip breakfast and lunch every other day (24-hour fast) 11.  Stress reduction, good night sleep, relaxation, meditation, yoga and other physical activity is likely to help you to maintain low weight too. 12.  If you drink alcohol, limit your alcohol intake to no more than 2 drinks a day.   Mediterranean diet is good for you. ([employer] has a typical Mediterranean cuisine menu) The Mediterranean diet is a way of eating based on the traditional cuisine of countries bordering the [employer]. While there is no single definition of the Mediterranean diet, it is typically high in vegetables, fruits, whole grains, beans, nut and seeds, and olive oil. The main components of Mediterranean diet include: [name] Daily consumption of vegetables, fruits, whole grains and healthy fats  . Weekly intake of fish, poultry, beans and eggs  . Moderate portions of dairy products  . Limited intake of red meat Other important elements of the Mediterranean diet are sharing meals with family and friends, enjoying a glass of red wine and being physically active. Health benefits of a Mediterranean diet: A traditional Mediterranean diet consisting of large quantities of fresh fruits and vegetables, nuts, fish and olive oil-coupled with physical activity-can reduce your risk of serious mental and physical health problems by: Preventing heart disease and strokes. Following a Mediterranean diet limits your intake of refined breads, processed foods, and red meat, and encourages drinking red wine instead of hard liquor-all factors that can help prevent  heart disease and stroke. Keeping you agile. If you're an older adult, the nutrients gained with a Mediterranean diet may reduce your risk of developing muscle weakness and other signs of frailty by about 70 percent. Reducing the risk of Alzheimer's. Research suggests that the Sanders diet may improve cholesterol, blood sugar levels, and overall blood vessel health, which in turn may reduce your risk of Alzheimer's disease or dementia. Halving the risk of Parkinson's disease. The high levels of antioxidants in the Mediterranean diet can prevent cells from undergoing a damaging process called oxidative stress, thereby cutting the risk of Parkinson's disease in half. Increasing longevity. By reducing your risk of developing heart disease or cancer with the Mediterranean diet, you're reducing your risk of death at any age by 20%. Protecting against type 2 diabetes. A Mediterranean diet is rich in fiber which digests slowly, prevents huge swings in blood sugar, and can help you maintain a healthy weight.

## 2018-12-21 NOTE — Progress Notes (Signed)
Subjective:  Patient ID: Corey Oliver, male    DOB: January 30, 1966  Age: 53 y.o. MRN: [id number]  CC: No chief complaint on file.   HPI ANUBIS FUNDORA presents for ER f/u for HTN and HA C/o ED F/u Vit D, B12 def  Outpatient Medications Prior to Visit  Medication Sig Dispense Refill   acetaminophen (TYLENOL) 325 MG tablet Take 2 tablets (650 mg total) by mouth every 6 (six) hours as needed. 30 tablet 0   Albuterol Sulfate (PROAIR RESPICLICK) 131 (90 Base) MCG/ACT AEPB Inhale 1-2 puffs into the lungs 4 (four) times daily as needed. 1 each 2   Cholecalciferol (HM VITAMIN D3) 100 MCG (4000 UT) CAPS 1 po qd 90 capsule 3   Cholecalciferol (VITAMIN D3) 25 MCG (1000 UT) CAPS Take 2,000 Units by mouth daily.     dexlansoprazole (DEXILANT) 60 MG capsule Take 1 capsule (60 mg total) by mouth daily. (Patient taking differently: Take 60 mg by mouth as needed. ) 90 capsule 3   Fluocin-Hydroquinone-Tretinoin (TRI-LUMA) 0.01-4-0.05 % CREA As directed 30 g 3   fluticasone (FLONASE) 50 MCG/ACT nasal spray Place 2 sprays into the nose daily. (Patient taking differently: Place 2 sprays into the nose as needed for allergies. ) 16 g 10   ibuprofen (ADVIL) 800 MG tablet TAKE 1 TABLET BY MOUTH EVERY 6 HOURS AS NEEDED (Patient taking differently: Take 800 mg by mouth every 6 (six) hours as needed for mild pain or moderate pain. TAKE 1 TABLET BY MOUTH EVERY 6 HOURS AS NEEDED) 100 tablet 1   ipratropium (ATROVENT) 0.03 % nasal spray Place 2 sprays into both nostrils 2 (two) times daily. Do not use for more than 5days. 30 mL 0   losartan-hydrochlorothiazide (HYZAAR) 100-25 MG tablet Take 1 tablet by mouth daily. 90 tablet 3   SUMAtriptan (IMITREX) 100 MG tablet Take 1 tablet (100 mg total) by mouth once. May repeat in 2 hours if headache persists or recurs. (Patient taking differently: Take 100 mg by mouth every 2 (two) hours as needed for migraine. May repeat in 2 hours if headache persists or recurs.) 12  tablet 3   tadalafil (CIALIS) 20 MG tablet Take 1 tablet (20 mg total) by mouth daily as needed for erectile dysfunction. (Patient not taking: Reported on 12/18/2018) 10 tablet 5   No facility-administered medications prior to visit.     ROS: Review of Systems  Constitutional: Positive for unexpected weight change. Negative for appetite change and fatigue.  HENT: Negative for congestion, nosebleeds, sneezing, sore throat and trouble swallowing.   Eyes: Negative for itching and visual disturbance.  Respiratory: Negative for cough.   Cardiovascular: Negative for chest pain, palpitations and leg swelling.  Gastrointestinal: Negative for abdominal distention, blood in stool, diarrhea and nausea.  Genitourinary: Negative for frequency and hematuria.  Musculoskeletal: Negative for back pain, gait problem, joint swelling and neck pain.  Skin: Negative for rash.  Neurological: Negative for dizziness, tremors, speech difficulty and weakness.  Psychiatric/Behavioral: Negative for agitation, dysphoric mood and sleep disturbance. The patient is not nervous/anxious.     Objective:  BP (!) 142/108 (BP Location: Left Arm, Patient Position: Sitting, Cuff Size: Large)    Pulse (!) 101    Temp 98.4 F (36.9 C) (Oral)    Ht 5\' 7"  (1.702 m)    Wt 223 lb (101.2 kg)    SpO2 95%    BMI 34.93 kg/m   BP Readings from Last 3 Encounters:  12/21/18 (!) 142/108  12/18/18 (!) 165/105  06/19/18 (!) 152/90    Wt Readings from Last 3 Encounters:  12/21/18 223 lb (101.2 kg)  12/18/18 205 lb (93 kg)  06/19/18 222 lb (100.7 kg)    Physical Exam Constitutional:      General: He is not in acute distress.    Appearance: He is well-developed.     Comments: NAD  Eyes:     Conjunctiva/sclera: Conjunctivae normal.     Pupils: Pupils are equal, round, and reactive to light.  Neck:     Musculoskeletal: Normal range of motion.     Thyroid: No thyromegaly.     Vascular: No JVD.  Cardiovascular:     Rate and  Rhythm: Normal rate and regular rhythm.     Heart sounds: Normal heart sounds. No murmur. No friction rub. No gallop.   Pulmonary:     Effort: Pulmonary effort is normal. No respiratory distress.     Breath sounds: Normal breath sounds. No wheezing or rales.  Chest:     Chest wall: No tenderness.  Abdominal:     General: Bowel sounds are normal. There is no distension.     Palpations: Abdomen is soft. There is no mass.     Tenderness: There is no abdominal tenderness. There is no guarding or rebound.  Musculoskeletal: Normal range of motion.        General: No tenderness.  Lymphadenopathy:     Cervical: No cervical adenopathy.  Skin:    General: Skin is warm and dry.     Findings: No rash.  Neurological:     Mental Status: He is alert and oriented to person, place, and time.     Cranial Nerves: No cranial nerve deficit.     Motor: No abnormal muscle tone.     Coordination: Coordination normal.     Gait: Gait normal.     Deep Tendon Reflexes: Reflexes are normal and symmetric.  Psychiatric:        Behavior: Behavior normal.        Thought Content: Thought content normal.        Judgment: Judgment normal.     Lab Results  Component Value Date   WBC 4.3 04/28/2018   HGB 14.4 04/28/2018   HCT 42.6 04/28/2018   PLT 292.0 04/28/2018   GLUCOSE 104 (H) 04/28/2018   CHOL 151 04/28/2018   TRIG 33.0 04/28/2018   HDL 41.10 04/28/2018   LDLCALC 103 (H) 04/28/2018   ALT 9 04/28/2018   AST 15 04/28/2018   NA 138 04/28/2018   K 3.8 04/28/2018   CL 104 04/28/2018   CREATININE 1.02 04/28/2018   BUN 14 04/28/2018   CO2 26 04/28/2018   TSH 2.33 04/28/2018   PSA 2.30 04/28/2018    No results found.  Assessment & Plan:     Follow-up: No follow-ups on file.  Walker Kehr, MD

## 2018-12-24 ENCOUNTER — Encounter: Payer: Self-pay | Admitting: Internal Medicine

## 2018-12-24 NOTE — Assessment & Plan Note (Signed)
On B12 

## 2018-12-24 NOTE — Assessment & Plan Note (Signed)
Vit D 

## 2019-03-13 ENCOUNTER — Other Ambulatory Visit: Payer: Self-pay

## 2019-03-13 DIAGNOSIS — Z20822 Contact with and (suspected) exposure to covid-19: Secondary | ICD-10-CM

## 2019-03-14 LAB — NOVEL CORONAVIRUS, NAA: SARS-CoV-2, NAA: NOT DETECTED

## 2019-04-06 ENCOUNTER — Other Ambulatory Visit: Payer: Self-pay

## 2019-04-06 DIAGNOSIS — Z20822 Contact with and (suspected) exposure to covid-19: Secondary | ICD-10-CM

## 2019-04-09 LAB — NOVEL CORONAVIRUS, NAA: SARS-CoV-2, NAA: NOT DETECTED

## 2019-06-21 ENCOUNTER — Ambulatory Visit: Payer: 59 | Admitting: Internal Medicine

## 2019-07-03 ENCOUNTER — Other Ambulatory Visit: Payer: Self-pay | Admitting: Internal Medicine

## 2019-09-13 ENCOUNTER — Telehealth: Payer: Self-pay | Admitting: Internal Medicine

## 2019-09-13 DIAGNOSIS — Z0000 Encounter for general adult medical examination without abnormal findings: Secondary | ICD-10-CM

## 2019-09-13 DIAGNOSIS — I10 Essential (primary) hypertension: Secondary | ICD-10-CM

## 2019-09-13 DIAGNOSIS — E538 Deficiency of other specified B group vitamins: Secondary | ICD-10-CM

## 2019-09-13 DIAGNOSIS — N529 Male erectile dysfunction, unspecified: Secondary | ICD-10-CM

## 2019-09-13 DIAGNOSIS — E559 Vitamin D deficiency, unspecified: Secondary | ICD-10-CM

## 2019-09-13 NOTE — Telephone Encounter (Signed)
New message:   Pt would like to come on Monday to have labs done before his CPE on 09/19/19. Please advise the patient when orders are in the system.

## 2019-09-14 NOTE — Telephone Encounter (Signed)
Pt notified labs ordered.

## 2019-09-17 ENCOUNTER — Other Ambulatory Visit (INDEPENDENT_AMBULATORY_CARE_PROVIDER_SITE_OTHER): Payer: 59

## 2019-09-17 ENCOUNTER — Other Ambulatory Visit: Payer: Self-pay

## 2019-09-17 DIAGNOSIS — E559 Vitamin D deficiency, unspecified: Secondary | ICD-10-CM

## 2019-09-17 DIAGNOSIS — N529 Male erectile dysfunction, unspecified: Secondary | ICD-10-CM | POA: Diagnosis not present

## 2019-09-17 DIAGNOSIS — Z0000 Encounter for general adult medical examination without abnormal findings: Secondary | ICD-10-CM | POA: Diagnosis not present

## 2019-09-17 DIAGNOSIS — I10 Essential (primary) hypertension: Secondary | ICD-10-CM

## 2019-09-17 DIAGNOSIS — E538 Deficiency of other specified B group vitamins: Secondary | ICD-10-CM | POA: Diagnosis not present

## 2019-09-17 LAB — BASIC METABOLIC PANEL
BUN: 17 mg/dL (ref 6–23)
CO2: 27 mEq/L (ref 19–32)
Calcium: 9.5 mg/dL (ref 8.4–10.5)
Chloride: 104 mEq/L (ref 96–112)
Creatinine, Ser: 1.03 mg/dL (ref 0.40–1.50)
GFR: 91.04 mL/min (ref >60.00)
Glucose, Bld: 101 mg/dL — ABNORMAL HIGH (ref 70–99)
Potassium: 4 mEq/L (ref 3.5–5.1)
Sodium: 137 mEq/L (ref 135–145)

## 2019-09-17 LAB — URINALYSIS
Bilirubin Urine: NEGATIVE
Hgb urine dipstick: NEGATIVE
Ketones, ur: NEGATIVE
Leukocytes,Ua: NEGATIVE
Nitrite: NEGATIVE
Specific Gravity, Urine: 1.025 (ref 1.000–1.030)
Total Protein, Urine: NEGATIVE
Urine Glucose: NEGATIVE
Urobilinogen, UA: 1 (ref 0.0–1.0)
pH: 6 (ref 5.0–8.0)

## 2019-09-17 LAB — LIPID PANEL
Cholesterol: 130 mg/dL (ref 0–200)
HDL: 37.3 mg/dL — ABNORMAL LOW (ref >39.00)
LDL Cholesterol: 84 mg/dL (ref 0–99)
NonHDL: 92.48
Total CHOL/HDL Ratio: 3
Triglycerides: 40 mg/dL (ref 0.0–149.0)
VLDL: 8 mg/dL (ref 0.0–40.0)

## 2019-09-17 LAB — CBC WITH DIFFERENTIAL/PLATELET
Basophils Absolute: 0.1 10*3/uL (ref 0.0–0.1)
Basophils Relative: 1.1 % (ref 0.0–3.0)
Eosinophils Absolute: 0.1 10*3/uL (ref 0.0–0.7)
Eosinophils Relative: 1.6 % (ref 0.0–5.0)
HCT: 40.3 % (ref 39.0–52.0)
Hemoglobin: 13.5 g/dL (ref 13.0–17.0)
Lymphocytes Relative: 37.8 % (ref 12.0–46.0)
Lymphs Abs: 1.7 10*3/uL (ref 0.7–4.0)
MCHC: 33.4 g/dL (ref 30.0–36.0)
MCV: 89 fl (ref 78.0–100.0)
Monocytes Absolute: 0.5 10*3/uL (ref 0.1–1.0)
Monocytes Relative: 11.6 % (ref 3.0–12.0)
Neutro Abs: 2.1 10*3/uL (ref 1.4–7.7)
Neutrophils Relative %: 47.9 % (ref 43.0–77.0)
Platelets: 293 10*3/uL (ref 150.0–400.0)
RBC: 4.53 Mil/uL (ref 4.22–5.81)
RDW: 13.4 % (ref 11.5–15.5)
WBC: 4.4 10*3/uL (ref 4.0–10.5)

## 2019-09-17 LAB — TSH: TSH: 0.1 u[IU]/mL — ABNORMAL LOW (ref 0.35–4.50)

## 2019-09-17 LAB — HEPATIC FUNCTION PANEL
ALT: 12 U/L (ref 0–53)
AST: 14 U/L (ref 0–37)
Albumin: 4.3 g/dL (ref 3.5–5.2)
Alkaline Phosphatase: 57 U/L (ref 39–117)
Bilirubin, Direct: 0.1 mg/dL (ref 0.0–0.3)
Total Bilirubin: 0.6 mg/dL (ref 0.2–1.2)
Total Protein: 7.2 g/dL (ref 6.0–8.3)

## 2019-09-17 LAB — VITAMIN D 25 HYDROXY (VIT D DEFICIENCY, FRACTURES): VITD: 39.08 ng/mL (ref 30.00–100.00)

## 2019-09-17 LAB — PSA: PSA: 3.85 ng/mL (ref 0.10–4.00)

## 2019-09-17 LAB — TESTOSTERONE: Testosterone: 400.19 ng/dL (ref 300.00–890.00)

## 2019-09-17 LAB — VITAMIN B12: Vitamin B-12: 1146 pg/mL — ABNORMAL HIGH (ref 211–911)

## 2019-09-17 NOTE — Telephone Encounter (Signed)
thx

## 2019-09-19 ENCOUNTER — Other Ambulatory Visit: Payer: Self-pay

## 2019-09-19 ENCOUNTER — Encounter: Payer: Self-pay | Admitting: Internal Medicine

## 2019-09-19 ENCOUNTER — Ambulatory Visit (INDEPENDENT_AMBULATORY_CARE_PROVIDER_SITE_OTHER): Payer: 59 | Admitting: Internal Medicine

## 2019-09-19 VITALS — BP 130/78 | HR 100 | Temp 98.1°F | Ht 67.0 in | Wt 226.0 lb

## 2019-09-19 DIAGNOSIS — R519 Headache, unspecified: Secondary | ICD-10-CM

## 2019-09-19 DIAGNOSIS — I10 Essential (primary) hypertension: Secondary | ICD-10-CM | POA: Diagnosis not present

## 2019-09-19 DIAGNOSIS — N320 Bladder-neck obstruction: Secondary | ICD-10-CM | POA: Insufficient documentation

## 2019-09-19 DIAGNOSIS — E538 Deficiency of other specified B group vitamins: Secondary | ICD-10-CM | POA: Diagnosis not present

## 2019-09-19 DIAGNOSIS — R7989 Other specified abnormal findings of blood chemistry: Secondary | ICD-10-CM | POA: Diagnosis not present

## 2019-09-19 DIAGNOSIS — E559 Vitamin D deficiency, unspecified: Secondary | ICD-10-CM

## 2019-09-19 DIAGNOSIS — N529 Male erectile dysfunction, unspecified: Secondary | ICD-10-CM

## 2019-09-19 DIAGNOSIS — Z0000 Encounter for general adult medical examination without abnormal findings: Secondary | ICD-10-CM

## 2019-09-19 MED ORDER — TADALAFIL 5 MG PO TABS: 5.0000 mg | ORAL_TABLET | Freq: Every day | ORAL | 3 refills | Status: DC

## 2019-09-19 NOTE — Assessment & Plan Note (Signed)
Vit D 

## 2019-09-19 NOTE — Assessment & Plan Note (Signed)
Daily Cialis Urol appt in June 2021

## 2019-09-19 NOTE — Assessment & Plan Note (Addendum)
daily Cialis - treat BPH sx's F/u w/Urol in June

## 2019-09-19 NOTE — Assessment & Plan Note (Signed)
BP Readings from Last 3 Encounters:  09/19/19 130/78  12/21/18 (!) 142/108  12/18/18 (!) 165/105

## 2019-09-19 NOTE — Assessment & Plan Note (Signed)
B complex

## 2019-09-19 NOTE — Assessment & Plan Note (Signed)
Maxalt prn 

## 2019-09-19 NOTE — Progress Notes (Signed)
Subjective:  Patient ID: Corey Oliver, male    DOB: 02/23/1966  Age: 54 y.o. MRN: [id number]  CC: No chief complaint on file.   HPI [name] presents for a well exam C/o migraines - worse w/weather changer C/o ED  Outpatient Medications Prior to Visit  Medication Sig Dispense Refill  . acetaminophen (TYLENOL) 325 MG tablet Take 2 tablets (650 mg total) by mouth every 6 (six) hours as needed. 30 tablet 0  . Albuterol Sulfate (PROAIR RESPICLICK) 123XX123 (90 Base) MCG/ACT AEPB Inhale 1-2 puffs into the lungs 4 (four) times daily as needed. 1 each 2  . Cholecalciferol (HM VITAMIN D3) 100 MCG (4000 UT) CAPS 1 po qd 90 capsule 3  . Cholecalciferol (VITAMIN D3) 25 MCG (1000 UT) CAPS Take 2,000 Units by mouth daily.    [name] dexlansoprazole (DEXILANT) 60 MG capsule Take 1 capsule (60 mg total) by mouth as needed. 90 capsule 0  . Fluocin-Hydroquinone-Tretinoin (TRI-LUMA) 0.01-4-0.05 % CREA As directed 30 g 3  . fluticasone (FLONASE) 50 MCG/ACT nasal spray Place 2 sprays into the nose daily. (Patient taking differently: Place 2 sprays into the nose as needed for allergies. ) 16 g 10  . ibuprofen (ADVIL) 800 MG tablet TAKE 1 TABLET BY MOUTH EVERY 6 HOURS AS NEEDED (Patient taking differently: Take 800 mg by mouth every 6 (six) hours as needed for mild pain or moderate pain. TAKE 1 TABLET BY MOUTH EVERY 6 HOURS AS NEEDED) 100 tablet 1  . ipratropium (ATROVENT) 0.03 % nasal spray Place 2 sprays into both nostrils 2 (two) times daily. Do not use for more than 5days. 30 mL 0  . SUMAtriptan (IMITREX) 100 MG tablet Take 1 tablet (100 mg total) by mouth once. May repeat in 2 hours if headache persists or recurs. (Patient taking differently: Take 100 mg by mouth every 2 (two) hours as needed for migraine. May repeat in 2 hours if headache persists or recurs.) 12 tablet 3  . TRIBENZOR 40-10-25 MG TABS Take 1 tablet by mouth 1 day or 1 dose. 90 tablet 3  . vardenafil (LEVITRA) 20 MG tablet Take 1 tablet (20 mg  total) by mouth daily as needed. 10 tablet 6  . tadalafil (CIALIS) 20 MG tablet Take 1 tablet (20 mg total) by mouth daily as needed for erectile dysfunction. (Patient not taking: Reported on 12/18/2018) 10 tablet 5   No facility-administered medications prior to visit.    ROS: Review of Systems  Constitutional: Negative for appetite change, fatigue and unexpected weight change.  HENT: Negative for congestion, nosebleeds, sneezing, sore throat and trouble swallowing.   Eyes: Negative for itching and visual disturbance.  Respiratory: Negative for cough.   Cardiovascular: Negative for chest pain, palpitations and leg swelling.  Gastrointestinal: Negative for abdominal distention, blood in stool, diarrhea and nausea.  Genitourinary: Positive for frequency and urgency. Negative for hematuria.  Musculoskeletal: Negative for back pain, gait problem, joint swelling and neck pain.  Skin: Negative for rash.  Neurological: Negative for dizziness, tremors, speech difficulty and weakness.  Psychiatric/Behavioral: Negative for agitation, dysphoric mood, sleep disturbance and suicidal ideas. The patient is not nervous/anxious.     Objective:  BP 130/78 (BP Location: Left Arm, Patient Position: Sitting, Cuff Size: Large)   Pulse 100   Temp 98.1 F (36.7 C) (Oral)   Ht 5\' 7"  (1.702 m)   Wt 226 lb (102.5 kg)   SpO2 96%   BMI 35.40 kg/m   BP Readings from Last 3  Encounters:  09/19/19 130/78  12/21/18 (!) 142/108  12/18/18 (!) 165/105    Wt Readings from Last 3 Encounters:  09/19/19 226 lb (102.5 kg)  12/21/18 223 lb (101.2 kg)  12/18/18 205 lb (93 kg)    Physical Exam Constitutional:      General: He is not in acute distress.    Appearance: He is well-developed.     Comments: NAD  Eyes:     Conjunctiva/sclera: Conjunctivae normal.     Pupils: Pupils are equal, round, and reactive to light.  Neck:     Thyroid: No thyromegaly.     Vascular: No JVD.  Cardiovascular:     Rate and  Rhythm: Normal rate and regular rhythm.     Heart sounds: Normal heart sounds. No murmur. No friction rub. No gallop.   Pulmonary:     Effort: Pulmonary effort is normal. No respiratory distress.     Breath sounds: Normal breath sounds. No wheezing or rales.  Chest:     Chest wall: No tenderness.  Abdominal:     General: Bowel sounds are normal. There is no distension.     Palpations: Abdomen is soft. There is no mass.     Tenderness: There is no abdominal tenderness. There is no guarding or rebound.  Musculoskeletal:        General: No tenderness. Normal range of motion.     Cervical back: Normal range of motion.  Lymphadenopathy:     Cervical: No cervical adenopathy.  Skin:    General: Skin is warm and dry.     Findings: No rash.  Neurological:     Mental Status: He is alert and oriented to person, place, and time.     Cranial Nerves: No cranial nerve deficit.     Motor: No abnormal muscle tone.     Coordination: Coordination normal.     Gait: Gait normal.     Deep Tendon Reflexes: Reflexes are normal and symmetric.  Psychiatric:        Behavior: Behavior normal.        Thought Content: Thought content normal.        Judgment: Judgment normal.   Rectal per Urol in June  Lab Results  Component Value Date   WBC 4.4 09/17/2019   HGB 13.5 09/17/2019   HCT 40.3 09/17/2019   PLT 293.0 09/17/2019   GLUCOSE 101 (H) 09/17/2019   CHOL 130 09/17/2019   TRIG 40.0 09/17/2019   HDL 37.30 (L) 09/17/2019   LDLCALC 84 09/17/2019   ALT 12 09/17/2019   AST 14 09/17/2019   NA 137 09/17/2019   K 4.0 09/17/2019   CL 104 09/17/2019   CREATININE 1.03 09/17/2019   BUN 17 09/17/2019   CO2 27 09/17/2019   TSH 0.10 (L) 09/17/2019   PSA 3.85 09/17/2019    No results found.  Assessment & Plan:   Walker Kehr, MD

## 2019-09-19 NOTE — Assessment & Plan Note (Signed)
TSH, FT4 in 1 mo

## 2019-09-19 NOTE — Patient Instructions (Signed)

## 2019-09-21 ENCOUNTER — Telehealth: Payer: Self-pay | Admitting: Internal Medicine

## 2019-09-21 MED ORDER — CIALIS 20 MG PO TABS
20.0000 mg | ORAL_TABLET | Freq: Every day | ORAL | 3 refills | Status: DC | PRN
Start: 2019-09-21 — End: 2019-11-23

## 2019-09-21 NOTE — Addendum Note (Signed)
Addended by: Karren Cobble on: 09/21/2019 09:39 AM   Modules accepted: Orders

## 2019-09-21 NOTE — Telephone Encounter (Signed)
Patient dropped off FMLA forms to be completed for migraines 1 - 2 days a week is what he states was discuss during his OV on 4/28. He also dropped off a renewal window tint forms from the [facility].   Forms have been completed and placed in providers box to review and sign.  Patient aware Dr.Plotnikov is not in office on Fridays.

## 2019-09-25 DIAGNOSIS — Z0279 Encounter for issue of other medical certificate: Secondary | ICD-10-CM

## 2019-09-25 NOTE — Telephone Encounter (Signed)
Provider still has the forms.

## 2019-09-25 NOTE — Telephone Encounter (Signed)
Forms have been completed and Faxed to [employer], Copy sent to [employer]Charged for.   Patient informed and will pick up original.

## 2019-09-25 NOTE — Telephone Encounter (Signed)
   Patient calling for status of forms

## 2019-09-30 ENCOUNTER — Other Ambulatory Visit: Payer: Self-pay | Admitting: Internal Medicine

## 2019-10-05 ENCOUNTER — Telehealth: Payer: Self-pay | Admitting: Internal Medicine

## 2019-10-05 NOTE — Telephone Encounter (Signed)
   Patient calling to inform Dr Alain Marion a form is being faxed to the office for completion regarding CIALIS 20 MG tablet The form is coming from [facility]

## 2019-10-10 NOTE — Telephone Encounter (Signed)
Key: [id number]

## 2019-10-17 NOTE — Telephone Encounter (Signed)
Received fax from [employer]. PA is approved 10/10/19-10/09/20.

## 2019-10-26 NOTE — Telephone Encounter (Signed)
   Patient is calling to discuss Cialis quantity. Patient states [facility]  Requires a 90 day supply for lower cost. Advised patient script says 90 tablets with 3 refills.

## 2019-11-19 ENCOUNTER — Telehealth: Payer: Self-pay

## 2019-11-19 NOTE — Telephone Encounter (Signed)
New message    Prior Authorization form will be coming from [facility]   1.Medication Requested:CIALIS 20 MG tablet  The mg should be  5 mg instead with a 3 month supply   2. Pharmacy (Name, Street, City):[id number] [place], [place] - [address]  3. On Med List: Yes   4. Last Visit with PCP: 4.28.2021   5. Next visit date with PCP: n/a    Agent: Please be advised that RX refills may take up to 3 business days. We ask that you follow-up with your pharmacy.

## 2019-11-23 MED ORDER — TADALAFIL 5 MG PO TABS: 5.0000 mg | ORAL_TABLET | Freq: Every day | ORAL | 3 refills | Status: DC

## 2019-11-23 NOTE — Telephone Encounter (Signed)
Done. Thx.

## 2019-11-23 NOTE — Telephone Encounter (Signed)
Okay to change Cialis 20 mg to 5 mg?

## 2019-11-23 NOTE — Telephone Encounter (Signed)
Spoke to pt and informed him his Cialis medication has been reduced to 5 mg as requested and sent to pharmacy.

## 2019-11-28 NOTE — Telephone Encounter (Addendum)
    Patient calling to discuss Cialis, patient requesting name brand only

## 2019-11-29 MED ORDER — CIALIS 5 MG PO TABS: 5.0000 mg | ORAL_TABLET | Freq: Every day | ORAL | 3 refills | Status: DC

## 2019-11-29 NOTE — Addendum Note (Signed)
Addended by: Darlys Gales on: 11/29/2019 01:23 PM   Modules accepted: Orders

## 2019-11-29 NOTE — Telephone Encounter (Signed)
Put in new Rx for brand name cialis, pt informed

## 2019-12-19 ENCOUNTER — Telehealth: Payer: Self-pay

## 2019-12-19 NOTE — Telephone Encounter (Signed)
PA INITIATED FOR CIALIS 5MG  Key: [id number]

## 2020-01-23 ENCOUNTER — Other Ambulatory Visit: Payer: Self-pay | Admitting: Internal Medicine

## 2020-02-18 ENCOUNTER — Telehealth: Payer: Self-pay | Admitting: Internal Medicine

## 2020-02-18 NOTE — Telephone Encounter (Signed)
Patient requesting hemorrhoid suppositories that were prescribed at a prior visit  Not on current med list  [id number] [place], [place] - [address] Phone:  [phone]  Fax:  [phone]     Last appt: 4.28.21 Next appt: Not scheduled

## 2020-02-19 MED ORDER — HYDROCORTISONE ACETATE 25 MG RE SUPP
25.0000 mg | Freq: Two times a day (BID) | RECTAL | 0 refills | Status: DC
Start: 2020-02-19 — End: 2020-05-12

## 2020-02-19 NOTE — Telephone Encounter (Signed)
Ok Anusol HC OV  Thx

## 2020-02-26 NOTE — Telephone Encounter (Signed)
Scheduled OV for 10/27 at 2pm

## 2020-03-19 ENCOUNTER — Encounter: Payer: Self-pay | Admitting: Internal Medicine

## 2020-03-19 ENCOUNTER — Other Ambulatory Visit: Payer: Self-pay

## 2020-03-19 ENCOUNTER — Ambulatory Visit: Payer: 59 | Admitting: Internal Medicine

## 2020-03-19 DIAGNOSIS — N320 Bladder-neck obstruction: Secondary | ICD-10-CM

## 2020-03-19 DIAGNOSIS — E538 Deficiency of other specified B group vitamins: Secondary | ICD-10-CM

## 2020-03-19 DIAGNOSIS — R6882 Decreased libido: Secondary | ICD-10-CM

## 2020-03-19 DIAGNOSIS — I10 Essential (primary) hypertension: Secondary | ICD-10-CM | POA: Diagnosis not present

## 2020-03-19 DIAGNOSIS — N529 Male erectile dysfunction, unspecified: Secondary | ICD-10-CM

## 2020-03-19 DIAGNOSIS — K644 Residual hemorrhoidal skin tags: Secondary | ICD-10-CM

## 2020-03-19 MED ORDER — TRIAMCINOLONE ACETONIDE 0.1 % EX OINT: 1.0000 "application " | TOPICAL_OINTMENT | Freq: Two times a day (BID) | CUTANEOUS | 3 refills | Status: AC

## 2020-03-19 MED ORDER — DEXILANT 60 MG PO CPDR
DELAYED_RELEASE_CAPSULE | ORAL | 3 refills | Status: DC
Start: 2020-03-19 — End: 2021-03-02

## 2020-03-19 NOTE — Assessment & Plan Note (Signed)
C/o lack of sexual desire and c/o ED Working 7 d/wk - 84 h/wk - rest more Urol f/u

## 2020-03-19 NOTE — Patient Instructions (Signed)
Wet wipes

## 2020-03-19 NOTE — Assessment & Plan Note (Signed)
On B12 

## 2020-03-19 NOTE — Progress Notes (Signed)
Subjective:  Patient ID: Corey Oliver, male    DOB: 1966/01/02  Age: 54 y.o. MRN: [id number]  CC: Follow-up   HPI RICHRD KUZNIAR presents for GERD C/o hemorrhoids C/o lack of sexual desire and c/o ED Working 7 d/wk - 84 h/wk  Outpatient Medications Prior to Visit  Medication Sig Dispense Refill  . acetaminophen (TYLENOL) 325 MG tablet Take 2 tablets (650 mg total) by mouth every 6 (six) hours as needed. 30 tablet 0  . Albuterol Sulfate (PROAIR RESPICLICK) 505 (90 Base) MCG/ACT AEPB Inhale 1-2 puffs into the lungs 4 (four) times daily as needed. 1 each 2  . Cholecalciferol (HM VITAMIN D3) 100 MCG (4000 UT) CAPS 1 po qd 90 capsule 3  . Cholecalciferol (VITAMIN D3) 25 MCG (1000 UT) CAPS Take 2,000 Units by mouth daily.    [name] CIALIS 5 MG tablet Take 1 tablet (5 mg total) by mouth daily. 90 tablet 3  . DEXILANT 60 MG capsule TAKE 1 CAPSULE BY MOUTH AS NEEDED 90 capsule 1  . Fluocin-Hydroquinone-Tretinoin (TRI-LUMA) 0.01-4-0.05 % CREA As directed 30 g 3  . fluticasone (FLONASE) 50 MCG/ACT nasal spray Place 2 sprays into the nose daily. (Patient taking differently: Place 2 sprays into the nose as needed for allergies. ) 16 g 10  . hydrocortisone (ANUSOL-HC) 25 MG suppository Place 1 suppository (25 mg total) rectally 2 (two) times daily. 20 suppository 0  . ibuprofen (ADVIL) 800 MG tablet TAKE 1 TABLET BY MOUTH EVERY 6 HOURS AS NEEDED (Patient taking differently: Take 800 mg by mouth every 6 (six) hours as needed for mild pain or moderate pain. TAKE 1 TABLET BY MOUTH EVERY 6 HOURS AS NEEDED) 100 tablet 1  . ipratropium (ATROVENT) 0.03 % nasal spray Place 2 sprays into both nostrils 2 (two) times daily. Do not use for more than 5days. 30 mL 0  . SUMAtriptan (IMITREX) 100 MG tablet Take 1 tablet (100 mg total) by mouth once. May repeat in 2 hours if headache persists or recurs. (Patient taking differently: Take 100 mg by mouth every 2 (two) hours as needed for migraine. May repeat in 2 hours if  headache persists or recurs.) 12 tablet 3  . TRIBENZOR 40-10-25 MG TABS TAKE 1 TABLET BY MOUTH EVERY DAY 90 tablet 3  . vardenafil (LEVITRA) 20 MG tablet Take 1 tablet (20 mg total) by mouth daily as needed. 10 tablet 6   No facility-administered medications prior to visit.    ROS: Review of Systems  Constitutional: Positive for fatigue. Negative for appetite change and unexpected weight change.  HENT: Negative for congestion, nosebleeds, sneezing, sore throat and trouble swallowing.   Eyes: Negative for itching and visual disturbance.  Respiratory: Negative for cough.   Cardiovascular: Negative for chest pain, palpitations and leg swelling.  Gastrointestinal: Positive for constipation. Negative for abdominal distention, anal bleeding, blood in stool, nausea and rectal pain.  Genitourinary: Negative for frequency and hematuria.  Musculoskeletal: Negative for back pain, gait problem, joint swelling and neck pain.  Skin: Negative for rash.  Neurological: Positive for headaches. Negative for dizziness, tremors, speech difficulty and weakness.  Psychiatric/Behavioral: Negative for agitation, dysphoric mood and sleep disturbance. The patient is not nervous/anxious.     Objective:  BP 112/78 (BP Location: Left Arm)   Pulse 94   Temp 98.4 F (36.9 C) (Oral)   Wt 226 lb (102.5 kg)   SpO2 96%   BMI 35.40 kg/m   BP Readings from Last 3 Encounters:  03/19/20  112/78  09/19/19 130/78  12/21/18 (!) 142/108    Wt Readings from Last 3 Encounters:  03/19/20 226 lb (102.5 kg)  09/19/19 226 lb (102.5 kg)  12/21/18 223 lb (101.2 kg)    Physical Exam Constitutional:      General: He is not in acute distress.    Appearance: He is well-developed. He is obese.     Comments: NAD  Eyes:     Conjunctiva/sclera: Conjunctivae normal.     Pupils: Pupils are equal, round, and reactive to light.  Neck:     Thyroid: No thyromegaly.     Vascular: No JVD.  Cardiovascular:     Rate and Rhythm:  Normal rate and regular rhythm.     Heart sounds: Normal heart sounds. No murmur heard.  No friction rub. No gallop.   Pulmonary:     Effort: Pulmonary effort is normal. No respiratory distress.     Breath sounds: Normal breath sounds. No wheezing or rales.  Chest:     Chest wall: No tenderness.  Abdominal:     General: Bowel sounds are normal. There is no distension.     Palpations: Abdomen is soft. There is no mass.     Tenderness: There is no abdominal tenderness. There is no guarding or rebound.  Musculoskeletal:        General: No tenderness. Normal range of motion.     Cervical back: Normal range of motion.  Lymphadenopathy:     Cervical: No cervical adenopathy.  Skin:    General: Skin is warm and dry.     Findings: No rash.  Neurological:     Mental Status: He is alert and oriented to person, place, and time.     Cranial Nerves: No cranial nerve deficit.     Motor: No abnormal muscle tone.     Coordination: Coordination normal.     Gait: Gait normal.     Deep Tendon Reflexes: Reflexes are normal and symmetric.  Psychiatric:        Behavior: Behavior normal.        Thought Content: Thought content normal.        Judgment: Judgment normal.      Pt refused rectal exam  Lab Results  Component Value Date   WBC 4.4 09/17/2019   HGB 13.5 09/17/2019   HCT 40.3 09/17/2019   PLT 293.0 09/17/2019   GLUCOSE 101 (H) 09/17/2019   CHOL 130 09/17/2019   TRIG 40.0 09/17/2019   HDL 37.30 (L) 09/17/2019   LDLCALC 84 09/17/2019   ALT 12 09/17/2019   AST 14 09/17/2019   NA 137 09/17/2019   K 4.0 09/17/2019   CL 104 09/17/2019   CREATININE 1.03 09/17/2019   BUN 17 09/17/2019   CO2 27 09/17/2019   TSH 0.10 (L) 09/17/2019   PSA 3.85 09/17/2019    No results found.  Assessment & Plan:   There are no diagnoses linked to this encounter.   No orders of the defined types were placed in this encounter.    Follow-up: No follow-ups on file.  Walker Kehr, MD

## 2020-03-19 NOTE — Assessment & Plan Note (Signed)
Pt d/c'd Cialis due to hemorrhoid

## 2020-03-19 NOTE — Assessment & Plan Note (Signed)
BP Readings from Last 3 Encounters:  03/19/20 112/78  09/19/19 130/78  12/21/18 (!) 142/108

## 2020-03-19 NOTE — Assessment & Plan Note (Addendum)
recurrent external Rx supp triamc oint

## 2020-05-05 ENCOUNTER — Other Ambulatory Visit: Payer: Self-pay | Admitting: Internal Medicine

## 2020-05-12 ENCOUNTER — Telehealth: Payer: Self-pay | Admitting: Internal Medicine

## 2020-05-12 ENCOUNTER — Other Ambulatory Visit: Payer: Self-pay | Admitting: Internal Medicine

## 2020-05-12 DIAGNOSIS — K644 Residual hemorrhoidal skin tags: Secondary | ICD-10-CM

## 2020-05-12 DIAGNOSIS — K5900 Constipation, unspecified: Secondary | ICD-10-CM

## 2020-05-12 NOTE — Telephone Encounter (Signed)
1.Medication Requested: hydrocortisone-pramoxine (ANALPRAM-HC) 2.5-1 % rectal cream    2. Pharmacy (Name, Street, [place]): [id number] [place], [place] - [address]  3. On Med List: no   4. Last Visit with PCP: 10.27.2021  5. Next visit date with PCP: 4.28.22   Agent: Please be advised that RX refills may take up to 3 business days. We ask that you follow-up with your pharmacy.

## 2020-05-12 NOTE — Telephone Encounter (Signed)
Med is not on med list pls advise../lmb 

## 2020-05-13 NOTE — Telephone Encounter (Signed)
Okay to prescribe.  Corey Oliver needs to see his gastroenterologist Dr. Ardis Hughs for consultation.  Is it okay for me to refer? Thanks

## 2020-05-14 MED ORDER — HYDROCORT-PRAMOXINE (PERIANAL) 2.5-1 % EX CREA: 1.0000 "application " | TOPICAL_CREAM | Freq: Three times a day (TID) | CUTANEOUS | 0 refills | Status: AC

## 2020-05-14 NOTE — Telephone Encounter (Signed)
Okay.  Will refer.  Thanks

## 2020-05-14 NOTE — Telephone Encounter (Signed)
Notified pt w/MD response. Pt would like a referral to see Dr. Ardis Hughs.Marland KitchenJohny Oliver

## 2020-05-14 NOTE — Addendum Note (Signed)
Addended by: Earnstine Regal on: 05/14/2020 09:56 AM   Modules accepted: Orders

## 2020-05-14 NOTE — Addendum Note (Signed)
Addended by: Cassandria Anger on: 05/14/2020 01:08 PM   Modules accepted: Orders

## 2020-06-12 DIAGNOSIS — L649 Androgenic alopecia, unspecified: Secondary | ICD-10-CM | POA: Insufficient documentation

## 2020-06-12 DIAGNOSIS — L819 Disorder of pigmentation, unspecified: Secondary | ICD-10-CM | POA: Insufficient documentation

## 2020-07-17 ENCOUNTER — Telehealth: Payer: Self-pay | Admitting: Internal Medicine

## 2020-07-17 NOTE — Telephone Encounter (Signed)
Patient states he is having a procedure done with Urology on 3/2. He would like a form completed to provide his employer stating he can not lift anything for several days. He states the Urologist is unable to complete form for employer Patient is wanting note to reflect 3/2 procedure date to 3/7, returning to work 3/8. Patient states he can drop the form off at front desk

## 2020-07-18 NOTE — Telephone Encounter (Signed)
Notified pt w/MD response.../lmb 

## 2020-07-18 NOTE — Telephone Encounter (Signed)
It is normally done by an Radiographer, therapeutic. I don't have any information about the procedure. Thanks

## 2020-07-21 ENCOUNTER — Telehealth: Payer: Self-pay | Admitting: Internal Medicine

## 2020-07-21 NOTE — Telephone Encounter (Signed)
Patient dropped off renewal FMLA forms.

## 2020-07-22 NOTE — Telephone Encounter (Signed)
Forms have been completed and Placed in providers box to review and sign.

## 2020-07-23 DIAGNOSIS — Z0279 Encounter for issue of other medical certificate: Secondary | ICD-10-CM

## 2020-07-23 NOTE — Telephone Encounter (Signed)
Forms have been signed, Faxed to [phone], Copy sent to [employer] for.  NO VM and original mailed to patient for his records.

## 2020-07-25 ENCOUNTER — Ambulatory Visit: Payer: 59 | Admitting: Gastroenterology

## 2020-08-12 ENCOUNTER — Other Ambulatory Visit: Payer: Self-pay | Admitting: Family

## 2020-08-12 ENCOUNTER — Telehealth (INDEPENDENT_AMBULATORY_CARE_PROVIDER_SITE_OTHER): Payer: 59 | Admitting: Family

## 2020-08-12 DIAGNOSIS — R110 Nausea: Secondary | ICD-10-CM | POA: Diagnosis not present

## 2020-08-12 DIAGNOSIS — J069 Acute upper respiratory infection, unspecified: Secondary | ICD-10-CM

## 2020-08-12 DIAGNOSIS — R197 Diarrhea, unspecified: Secondary | ICD-10-CM

## 2020-08-12 LAB — POCT INFLUENZA A/B
Influenza A, POC: NEGATIVE
Influenza B, POC: NEGATIVE

## 2020-08-12 MED ORDER — ONDANSETRON 8 MG PO TBDP: 8.0000 mg | ORAL_TABLET | Freq: Three times a day (TID) | ORAL | 0 refills | Status: DC | PRN

## 2020-08-12 NOTE — Progress Notes (Signed)
Corey Oliver is a 55 y.o. male with the following history as recorded in EpicCare:  Patient Active Problem List   Diagnosis Date Noted  . External hemorrhoids 03/19/2020  . Libido, decreased 03/19/2020  . Abnormal TSH 09/19/2019  . 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 Acute sinusitis 06/19/2018  . Constipation 04/27/2017  . Chest pressure 11/07/2015  . Wheezing 11/07/2015  . B12 deficiency 04/21/2015  . Cerumen impaction 04/21/2015  . Headache 04/21/2015  . Shoulder pain, right 11/06/2014  . Fatigue 11/06/2014  . Well adult exam 02/29/2012  . Obstructive sleep apnea 08/27/2009  . Erectile dysfunction 08/27/2009  . Vitamin D deficiency 08/26/2009  . SHOULDER PAIN, LEFT 06/24/2009  . CYSTIC ACNE 01/30/2008  . Essential hypertension 06/13/2007  . GERD 02/08/2007  . IBS 02/08/2007    Current Outpatient Medications  Medication Sig Dispense Refill  . ondansetron (ZOFRAN ODT) 8 MG disintegrating tablet Take 1 tablet (8 mg total) by mouth every 8 (eight) hours as needed for nausea or vomiting. 20 tablet 0  . acetaminophen (TYLENOL) 325 MG tablet Take 2 tablets (650 mg total) by mouth every 6 (six) hours as needed. 30 tablet 0  . Albuterol Sulfate (PROAIR RESPICLICK) 401 (90 Base) MCG/ACT AEPB Inhale 1-2 puffs into the lungs 4 (four) times daily as needed. 1 each 2  . Cholecalciferol (HM VITAMIN D3) 100 MCG (4000 UT) CAPS 1 po qd 90 capsule 3  . Cholecalciferol (VITAMIN D3) 25 MCG (1000 UT) CAPS Take 2,000 Units by mouth daily.    [name] CIALIS 5 MG tablet Take 1 tablet (5 mg total) by mouth daily. 90 tablet 3  . dexlansoprazole (DEXILANT) 60 MG capsule TAKE 1 CAPSULE BY MOUTH AS NEEDED 90 capsule 3  . Fluocin-Hydroquinone-Tretinoin (TRI-LUMA) 0.01-4-0.05 % CREA As directed 30 g 3  . fluticasone (FLONASE) 50 MCG/ACT nasal spray Place 2 sprays into the nose daily. (Patient taking differently: Place 2 sprays into the nose as needed for allergies. ) 16 g 10  . hydrocortisone (ANUSOL-HC) 25  MG suppository PLACE 1 SUPPOSITORY (25 MG TOTAL) RECTALLY 2 (TWO) TIMES DAILY. 20 suppository 0  . hydrocortisone-pramoxine (ANALPRAM-HC) 2.5-1 % rectal cream Place 1 application rectally 3 (three) times daily. 30 g 0  . ibuprofen (ADVIL) 800 MG tablet TAKE 1 TABLET BY MOUTH EVERY 8 HOURS AS NEEDED 100 tablet 1  . ipratropium (ATROVENT) 0.03 % nasal spray Place 2 sprays into both nostrils 2 (two) times daily. Do not use for more than 5days. 30 mL 0  . SUMAtriptan (IMITREX) 100 MG tablet Take 1 tablet (100 mg total) by mouth once. May repeat in 2 hours if headache persists or recurs. (Patient taking differently: Take 100 mg by mouth every 2 (two) hours as needed for migraine. May repeat in 2 hours if headache persists or recurs.) 12 tablet 3  . triamcinolone ointment (KENALOG) 0.1 % Apply 1 application topically 2 (two) times daily. 80 g 3  . TRIBENZOR 40-10-25 MG TABS TAKE 1 TABLET BY MOUTH EVERY DAY 90 tablet 3  . vardenafil (LEVITRA) 20 MG tablet Take 1 tablet (20 mg total) by mouth daily as needed. 10 tablet 6   No current facility-administered medications for this visit.    Allergies: Patient has no known allergies.  Past Medical History:  Diagnosis Date  . Cystic acne   . GERD (gastroesophageal reflux disease)   . Hemorrhoids   . Hypertension   . IBS (irritable bowel syndrome)   . Influenza 05/23/12  . URI (upper  respiratory infection)     Past Surgical History:  Procedure Laterality Date  . APPENDECTOMY    . arthroscopic surgery left shoulder     for bone spurs jan '11    Family History  Problem Relation Age of Onset  . Ovarian cancer Mother   . Colon cancer Neg Hx     Social History   Tobacco Use  . Smoking status: Never Smoker  . Smokeless tobacco: Never Used  Substance Use Topics  . Alcohol use: No    Alcohol/week: 0.0 standard drinks    Subjective:    I connected with Corey Oliver on 08/12/20 at 10:00 AM EDT by a video enabled telemedicine application and  verified that I am speaking with the correct person using two identifiers.   I discussed the limitations of evaluation and management by telemedicine and the availability of in person appointments. The patient expressed understanding and agreed to proceed.  Complaining of nausea/ diarrhea/ congestion x 2-3 days; notes that co-worker was "sick"on Saturday when they worked together. + achy, head congestion as well; has taken home COVID test yesterday which was negative; notes that temperature was 101 yesterday- down to 99 this morning;      Objective:  There were no vitals filed for this visit.  General: Well developed, well nourished, in no acute distress  Head: Normocephalic and atraumatic  Lungs: Respirations unlabored;  Neurologic: Alert and oriented; speech intact; face symmetrical;   Assessment:  1. URI with cough and congestion   2. Nausea   3. Diarrhea, unspecified type     Plan:  Suspect viral illness; will check COVID PCR and flu today; Rx for Zofran 8 mg q 6-8 hours as needed; work note given as requested; symptomatic treatment discussed; follow-up worse, no better.   No follow-ups on file.  Orders Placed This Encounter  Procedures  . Novel Coronavirus, NAA ([facility])    Order Specific Question:   Is this test for diagnosis or screening    Answer:   Diagnosis of ill patient    Order Specific Question:   Symptomatic for COVID-19 as defined by CDC    Answer:   Yes    Order Specific Question:   Date of Symptom Onset    Answer:   08/11/2020    Order Specific Question:   Hospitalized for COVID-19    Answer:   No    Order Specific Question:   Admitted to ICU for COVID-19    Answer:   No    Order Specific Question:   Previously tested for COVID-19    Answer:   Yes    Order Specific Question:   Resident in a congregate (group) care setting    Answer:   No    Order Specific Question:   Is the patient student?    Answer:   No    Order Specific Question:   Employed in  healthcare setting    Answer:   No    Order Specific Question:   Has patient completed COVID vaccination(s) (2 doses of Pfizer/Moderna 1 dose of [employer])    Answer:   Unknown    Requested Prescriptions   Signed Prescriptions Disp Refills  . ondansetron (ZOFRAN ODT) 8 MG disintegrating tablet 20 tablet 0    Sig: Take 1 tablet (8 mg total) by mouth every 8 (eight) hours as needed for nausea or vomiting.

## 2020-08-12 NOTE — Addendum Note (Signed)
Addended by: Elza Rafter D on: 08/12/2020 01:47 PM   Modules accepted: Orders

## 2020-08-13 LAB — NOVEL CORONAVIRUS, NAA: SARS-CoV-2, NAA: NOT DETECTED

## 2020-08-13 LAB — SARS-COV-2, NAA 2 DAY TAT

## 2020-08-14 ENCOUNTER — Telehealth: Payer: Self-pay

## 2020-08-14 NOTE — Telephone Encounter (Signed)
Pt notified that Covid test is negative; states he received the the results via mychart.  FMLA paperwork received when pt came for testing. Reviewed by Mindi Slicker FNP & will be completed & faxed to number requested by pt. [phone].  Pt request original be mailed to his home; home address verified on file; pt would also like return call to confirm fax to number above.

## 2020-08-14 NOTE — Telephone Encounter (Signed)
Forms have been completed & Faxed to [facility] [phone], Copy sent to scan.  Patient informed and original mailed to patient as requested.

## 2020-09-15 DIAGNOSIS — R972 Elevated prostate specific antigen [PSA]: Secondary | ICD-10-CM | POA: Insufficient documentation

## 2020-09-15 DIAGNOSIS — E291 Testicular hypofunction: Secondary | ICD-10-CM | POA: Insufficient documentation

## 2020-09-18 ENCOUNTER — Other Ambulatory Visit: Payer: Self-pay

## 2020-09-18 ENCOUNTER — Ambulatory Visit: Payer: 59 | Admitting: Internal Medicine

## 2020-09-18 ENCOUNTER — Encounter: Payer: Self-pay | Admitting: Internal Medicine

## 2020-09-18 VITALS — BP 118/72 | HR 91 | Temp 98.0°F | Ht 67.0 in | Wt 211.6 lb

## 2020-09-18 DIAGNOSIS — E538 Deficiency of other specified B group vitamins: Secondary | ICD-10-CM

## 2020-09-18 DIAGNOSIS — N320 Bladder-neck obstruction: Secondary | ICD-10-CM

## 2020-09-18 DIAGNOSIS — R7989 Other specified abnormal findings of blood chemistry: Secondary | ICD-10-CM

## 2020-09-18 DIAGNOSIS — I10 Essential (primary) hypertension: Secondary | ICD-10-CM | POA: Diagnosis not present

## 2020-09-18 DIAGNOSIS — E559 Vitamin D deficiency, unspecified: Secondary | ICD-10-CM | POA: Diagnosis not present

## 2020-09-18 LAB — TSH: TSH: 1.97 u[IU]/mL (ref 0.35–4.50)

## 2020-09-18 LAB — URINALYSIS
Bilirubin Urine: NEGATIVE
Hgb urine dipstick: NEGATIVE
Ketones, ur: NEGATIVE
Leukocytes,Ua: NEGATIVE
Nitrite: NEGATIVE
Specific Gravity, Urine: 1.02 (ref 1.000–1.030)
Total Protein, Urine: NEGATIVE
Urine Glucose: NEGATIVE
Urobilinogen, UA: 0.2 (ref 0.0–1.0)
pH: 6 (ref 5.0–8.0)

## 2020-09-18 LAB — CBC WITH DIFFERENTIAL/PLATELET
Basophils Absolute: 0.1 10*3/uL (ref 0.0–0.1)
Basophils Relative: 1.4 % (ref 0.0–3.0)
Eosinophils Absolute: 0.1 10*3/uL (ref 0.0–0.7)
Eosinophils Relative: 1.2 % (ref 0.0–5.0)
HCT: 42.2 % (ref 39.0–52.0)
Hemoglobin: 14.3 g/dL (ref 13.0–17.0)
Lymphocytes Relative: 32.4 % (ref 12.0–46.0)
Lymphs Abs: 1.4 10*3/uL (ref 0.7–4.0)
MCHC: 33.8 g/dL (ref 30.0–36.0)
MCV: 87.3 fl (ref 78.0–100.0)
Monocytes Absolute: 0.6 10*3/uL (ref 0.1–1.0)
Monocytes Relative: 13.5 % — ABNORMAL HIGH (ref 3.0–12.0)
Neutro Abs: 2.2 10*3/uL (ref 1.4–7.7)
Neutrophils Relative %: 51.5 % (ref 43.0–77.0)
Platelets: 320 10*3/uL (ref 150.0–400.0)
RBC: 4.84 Mil/uL (ref 4.22–5.81)
RDW: 13.8 % (ref 11.5–15.5)
WBC: 4.2 10*3/uL (ref 4.0–10.5)

## 2020-09-18 LAB — COMPREHENSIVE METABOLIC PANEL
ALT: 13 U/L (ref 0–53)
AST: 17 U/L (ref 0–37)
Albumin: 4.5 g/dL (ref 3.5–5.2)
Alkaline Phosphatase: 62 U/L (ref 39–117)
BUN: 20 mg/dL (ref 6–23)
CO2: 27 mEq/L (ref 19–32)
Calcium: 9.7 mg/dL (ref 8.4–10.5)
Chloride: 102 mEq/L (ref 96–112)
Creatinine, Ser: 1.2 mg/dL (ref 0.40–1.50)
GFR: 68.29 mL/min (ref >60.00)
Glucose, Bld: 99 mg/dL (ref 70–99)
Potassium: 3.6 mEq/L (ref 3.5–5.1)
Sodium: 137 mEq/L (ref 135–145)
Total Bilirubin: 0.4 mg/dL (ref 0.2–1.2)
Total Protein: 8.1 g/dL (ref 6.0–8.3)

## 2020-09-18 LAB — LIPID PANEL
Cholesterol: 148 mg/dL (ref 0–200)
HDL: 39.4 mg/dL (ref >39.00)
LDL Cholesterol: 99 mg/dL (ref 0–99)
NonHDL: 108.56
Total CHOL/HDL Ratio: 4
Triglycerides: 47 mg/dL (ref 0.0–149.0)
VLDL: 9.4 mg/dL (ref 0.0–40.0)

## 2020-09-18 LAB — T4, FREE: Free T4: 0.92 ng/dL (ref 0.60–1.60)

## 2020-09-18 MED ORDER — TADALAFIL 20 MG PO TABS: 20.0000 mg | ORAL_TABLET | Freq: Every day | ORAL | 5 refills | Status: DC | PRN

## 2020-09-18 MED ORDER — FINASTERIDE 5 MG PO TABS: 5.0000 mg | ORAL_TABLET | Freq: Every day | ORAL | 3 refills | Status: AC

## 2020-09-18 NOTE — Assessment & Plan Note (Signed)
Olmes-HCT-Amlodipine

## 2020-09-18 NOTE — Progress Notes (Signed)
Subjective:  Patient ID: Corey Oliver, male    DOB: 1965/11/07  Age: 55 y.o. MRN: [id number]  CC: Follow-up (6 month f/u)   HPI Corey Oliver presents for elevated PSA - seeing Dr Amalia Hailey, HTN, HAs f/u  Outpatient Medications Prior to Visit  Medication Sig Dispense Refill  . acetaminophen (TYLENOL) 325 MG tablet Take 2 tablets (650 mg total) by mouth every 6 (six) hours as needed. 30 tablet 0  . Albuterol Sulfate (PROAIR RESPICLICK) 387 (90 Base) MCG/ACT AEPB Inhale 1-2 puffs into the lungs 4 (four) times daily as needed. 1 each 2  . Cholecalciferol (HM VITAMIN D3) 100 MCG (4000 UT) CAPS 1 po qd 90 capsule 3  . Cholecalciferol (VITAMIN D3) 25 MCG (1000 UT) CAPS Take 2,000 Units by mouth daily.    [name] CIALIS 5 MG tablet Take 1 tablet (5 mg total) by mouth daily. 90 tablet 3  . dexlansoprazole (DEXILANT) 60 MG capsule TAKE 1 CAPSULE BY MOUTH AS NEEDED 90 capsule 3  . Fluocin-Hydroquinone-Tretinoin (TRI-LUMA) 0.01-4-0.05 % CREA As directed 30 g 3  . fluticasone (FLONASE) 50 MCG/ACT nasal spray Place 2 sprays into the nose daily. (Patient taking differently: Place 2 sprays into the nose as needed for allergies.) 16 g 10  . hydrocortisone (ANUSOL-HC) 25 MG suppository PLACE 1 SUPPOSITORY (25 MG TOTAL) RECTALLY 2 (TWO) TIMES DAILY. 20 suppository 0  . hydrocortisone-pramoxine (ANALPRAM-HC) 2.5-1 % rectal cream Place 1 application rectally 3 (three) times daily. 30 g 0  . ibuprofen (ADVIL) 800 MG tablet TAKE 1 TABLET BY MOUTH EVERY 8 HOURS AS NEEDED 100 tablet 1  . ipratropium (ATROVENT) 0.03 % nasal spray Place 2 sprays into both nostrils 2 (two) times daily. Do not use for more than 5days. 30 mL 0  . ondansetron (ZOFRAN ODT) 8 MG disintegrating tablet Take 1 tablet (8 mg total) by mouth every 8 (eight) hours as needed for nausea or vomiting. 20 tablet 0  . SUMAtriptan (IMITREX) 100 MG tablet Take 1 tablet (100 mg total) by mouth once. May repeat in 2 hours if headache persists or recurs. (Patient  taking differently: Take 100 mg by mouth every 2 (two) hours as needed for migraine. May repeat in 2 hours if headache persists or recurs.) 12 tablet 3  . triamcinolone ointment (KENALOG) 0.1 % Apply 1 application topically 2 (two) times daily. 80 g 3  . TRIBENZOR 40-10-25 MG TABS TAKE 1 TABLET BY MOUTH EVERY DAY 90 tablet 3  . vardenafil (LEVITRA) 20 MG tablet Take 1 tablet (20 mg total) by mouth daily as needed. 10 tablet 6   No facility-administered medications prior to visit.    ROS: Review of Systems  Constitutional: Negative for appetite change, fatigue and unexpected weight change.  HENT: Negative for congestion, nosebleeds, sneezing, sore throat and trouble swallowing.   Eyes: Negative for itching and visual disturbance.  Respiratory: Negative for cough.   Cardiovascular: Negative for chest pain, palpitations and leg swelling.  Gastrointestinal: Negative for abdominal distention, blood in stool, diarrhea and nausea.  Genitourinary: Negative for frequency and hematuria.  Musculoskeletal: Negative for back pain, gait problem, joint swelling and neck pain.  Skin: Negative for rash.  Neurological: Negative for dizziness, tremors, speech difficulty and weakness.  Psychiatric/Behavioral: Negative for agitation, dysphoric mood and sleep disturbance. The patient is not nervous/anxious.     Objective:  BP 118/72 (BP Location: Left Arm)   Pulse 91   Temp 98 F (36.7 C) (Oral)   Ht 5\' 7"  (  1.702 m)   Wt 211 lb 9.6 oz (96 kg)   SpO2 97%   BMI 33.14 kg/m   BP Readings from Last 3 Encounters:  09/18/20 118/72  03/19/20 112/78  09/19/19 130/78    Wt Readings from Last 3 Encounters:  09/18/20 211 lb 9.6 oz (96 kg)  03/19/20 226 lb (102.5 kg)  09/19/19 226 lb (102.5 kg)    Physical Exam Constitutional:      General: He is not in acute distress.    Appearance: He is well-developed. He is obese.     Comments: NAD  Eyes:     Conjunctiva/sclera: Conjunctivae normal.     Pupils:  Pupils are equal, round, and reactive to light.  Neck:     Thyroid: No thyromegaly.     Vascular: No JVD.  Cardiovascular:     Rate and Rhythm: Normal rate and regular rhythm.     Heart sounds: Normal heart sounds. No murmur heard. No friction rub. No gallop.   Pulmonary:     Effort: Pulmonary effort is normal. No respiratory distress.     Breath sounds: Normal breath sounds. No wheezing or rales.  Chest:     Chest wall: No tenderness.  Abdominal:     General: Bowel sounds are normal. There is no distension.     Palpations: Abdomen is soft. There is no mass.     Tenderness: There is no abdominal tenderness. There is no guarding or rebound.  Musculoskeletal:        General: No tenderness. Normal range of motion.     Cervical back: Normal range of motion.  Lymphadenopathy:     Cervical: No cervical adenopathy.  Skin:    General: Skin is warm and dry.     Findings: No rash.  Neurological:     Mental Status: He is alert and oriented to person, place, and time.     Cranial Nerves: No cranial nerve deficit.     Motor: No abnormal muscle tone.     Coordination: Coordination normal.     Gait: Gait normal.     Deep Tendon Reflexes: Reflexes are normal and symmetric.  Psychiatric:        Behavior: Behavior normal.        Thought Content: Thought content normal.        Judgment: Judgment normal.     Lab Results  Component Value Date   WBC 4.4 09/17/2019   HGB 13.5 09/17/2019   HCT 40.3 09/17/2019   PLT 293.0 09/17/2019   GLUCOSE 101 (H) 09/17/2019   CHOL 130 09/17/2019   TRIG 40.0 09/17/2019   HDL 37.30 (L) 09/17/2019   LDLCALC 84 09/17/2019   ALT 12 09/17/2019   AST 14 09/17/2019   NA 137 09/17/2019   K 4.0 09/17/2019   CL 104 09/17/2019   CREATININE 1.03 09/17/2019   BUN 17 09/17/2019   CO2 27 09/17/2019   TSH 0.10 (L) 09/17/2019   PSA 3.85 09/17/2019    No results found.  Assessment & Plan:     Walker Kehr, MD

## 2020-09-18 NOTE — Assessment & Plan Note (Signed)
On Vit D 

## 2020-09-18 NOTE — Patient Instructions (Signed)
[facility]

## 2020-09-18 NOTE — Assessment & Plan Note (Signed)
On [place] °

## 2020-09-18 NOTE — Assessment & Plan Note (Signed)
On B complex 

## 2020-09-18 NOTE — Addendum Note (Signed)
Addended by: Jacobo Forest on: 09/18/2020 11:30 AM   Modules accepted: Orders

## 2020-10-09 DIAGNOSIS — L219 Seborrheic dermatitis, unspecified: Secondary | ICD-10-CM | POA: Insufficient documentation

## 2020-12-05 ENCOUNTER — Telehealth: Payer: Self-pay | Admitting: *Deleted

## 2020-12-05 NOTE — Telephone Encounter (Signed)
-----   Message from Marguarite Arbour sent at 12/04/2020  2:16 PM EDT ----- Prior authorization

## 2020-12-05 NOTE — Telephone Encounter (Signed)
Tried to submit PA via [employer] insurance on file is not correct. Rec'd msg "  There was an error with your request  Cannot find matching patient with Name and Date of Birth provided. For additional information, please contact the phone number on the back of the member prescription ID card.  Need up-to-date insurance card.Marland KitchenJohny Chess

## 2020-12-06 ENCOUNTER — Other Ambulatory Visit: Payer: Self-pay | Admitting: Internal Medicine

## 2020-12-08 NOTE — Telephone Encounter (Signed)
Sent pt mychart msg needing updated insurance card to proceed w/PA.Marland KitchenJohny Oliver

## 2020-12-12 NOTE — Telephone Encounter (Signed)
   Patient states the insurance information has not changed. He verified the ID number on the card scanned into Epic He states he has had the same insurance for years

## 2021-02-09 ENCOUNTER — Telehealth: Payer: Self-pay

## 2021-02-09 NOTE — Telephone Encounter (Signed)
Please advise as the pt has stated he needs his FMLA forms updated from 1-2 days out for migraines to 1-3 days out for his migraines.  I will put forms on CMA's desk.

## 2021-02-13 NOTE — Telephone Encounter (Signed)
Patient calling to see if FMLA forms have been completed  Please let patient know when ready for pick up

## 2021-02-16 NOTE — Telephone Encounter (Signed)
FMLA has been placed in purple MD order folder for completion when he return tomorrow.Marland KitchenJohny Oliver

## 2021-02-17 NOTE — Telephone Encounter (Signed)
Patient called back to check status of form.. says he needs to have it back as soon as possible

## 2021-02-18 ENCOUNTER — Telehealth: Payer: Self-pay | Admitting: Internal Medicine

## 2021-02-18 NOTE — Telephone Encounter (Signed)
Signed. Thx!

## 2021-02-18 NOTE — Telephone Encounter (Signed)
Stated his pharmacy needs a PA for the dexlansoprazole (DEXILANT) 60 MG capsule.

## 2021-02-18 NOTE — Telephone Encounter (Signed)
Notified pt forms are ready for pick-up. Pt states he would like them fax, but will call back to give fax #. Have place original up front for pick-up.Marland KitchenJohny Oliver

## 2021-02-23 ENCOUNTER — Other Ambulatory Visit (HOSPITAL_COMMUNITY): Payer: Self-pay

## 2021-02-27 NOTE — Telephone Encounter (Signed)
Submitted PA via cover-my-med w/ (Key: [id number]). Waiting on determination from insurance.Marland KitchenJohny Chess

## 2021-03-02 MED ORDER — PANTOPRAZOLE SODIUM 40 MG PO TBEC
40.0000 mg | DELAYED_RELEASE_TABLET | Freq: Every day | ORAL | 11 refills | Status: DC
Start: 2021-03-02 — End: 2021-03-30

## 2021-03-02 NOTE — Telephone Encounter (Signed)
Rec'd PA back med was denied. It states pt must have tried and failed 2 alternatives. Pt formulary plans are esomeprazole, lansoprazole, omeprazole, or pantoprazole...Corey Oliver

## 2021-03-02 NOTE — Telephone Encounter (Signed)
Okay pantoprazole.  We will send a prescription.  Thanks

## 2021-03-06 NOTE — Telephone Encounter (Signed)
Called pt no answer LMOM insurance denied Dexilant. Alternative " Pantoprazole" sent to pof.Marland KitchenJohny Chess

## 2021-03-07 DIAGNOSIS — N138 Other obstructive and reflux uropathy: Secondary | ICD-10-CM | POA: Insufficient documentation

## 2021-03-09 ENCOUNTER — Telehealth: Payer: Self-pay

## 2021-03-09 NOTE — Telephone Encounter (Signed)
Please advise as the pt has stated he been taking Dexilant for his acid reflux and is in need of his PA to be done for it as he can not take any other medication cause it makes him sick.  Pt can be reached at [phone] or [phone].

## 2021-03-24 NOTE — Telephone Encounter (Signed)
Submitted another PA for Dexilant w/ (Key: [id number]). Waiting on insurance determination.Marland Kitchenlmb

## 2021-03-26 ENCOUNTER — Telehealth: Payer: Self-pay | Admitting: Internal Medicine

## 2021-03-26 NOTE — Telephone Encounter (Signed)
Patient calling in requesting to speak w/ nurse or provider  Has OV scheduled for 11/16 for depression but says he needs to speak w/ someone before then  Please call patient [phone]

## 2021-03-27 NOTE — Telephone Encounter (Signed)
Rec'd PA determination this request has received an approval w/ approval dated starting 03/24/2021 to 03/24/2022. Faxed approval to pharmacy and notified pt.Marland KitchenJohny Oliver

## 2021-03-27 NOTE — Telephone Encounter (Signed)
Called pt he stated that he have been dealing with depression. He don;t feel like he is going to hurt himself, but he would really like to speak w/ Dr,. Plotnikov. He states his job is very stressful and he fel a lot is it because of hi work, inform pt MD I put of the office today, but he will need ov. MD had availability for Monday @ 8:40 and pt stated he will make appt. Made appt for Monday @ 8:40.Marland KitchenJohny Chess

## 2021-03-28 ENCOUNTER — Other Ambulatory Visit: Payer: Self-pay | Admitting: Internal Medicine

## 2021-03-30 ENCOUNTER — Other Ambulatory Visit: Payer: Self-pay

## 2021-03-30 ENCOUNTER — Encounter: Payer: Self-pay | Admitting: Internal Medicine

## 2021-03-30 ENCOUNTER — Ambulatory Visit: Payer: 59 | Admitting: Internal Medicine

## 2021-03-30 DIAGNOSIS — I10 Essential (primary) hypertension: Secondary | ICD-10-CM

## 2021-03-30 DIAGNOSIS — E538 Deficiency of other specified B group vitamins: Secondary | ICD-10-CM

## 2021-03-30 DIAGNOSIS — F329 Major depressive disorder, single episode, unspecified: Secondary | ICD-10-CM | POA: Insufficient documentation

## 2021-03-30 DIAGNOSIS — E559 Vitamin D deficiency, unspecified: Secondary | ICD-10-CM

## 2021-03-30 MED ORDER — BUPROPION HCL ER (XL) 150 MG PO TB24: 150.0000 mg | ORAL_TABLET | Freq: Every day | ORAL | 5 refills | Status: DC

## 2021-03-30 NOTE — Progress Notes (Signed)
Subjective:  Patient ID: Corey Oliver, male    DOB: Jun 20, 1965  Age: 55 y.o. MRN: [id number]  CC: Depression   HPI ARIF AMENDOLA presents for being depressed. Feeling burn out; working 12 h days x6-7/d weeks.Marland KitchenMarland KitchenTearful, sad....   Outpatient Medications Prior to Visit  Medication Sig Dispense Refill   acetaminophen (TYLENOL) 325 MG tablet Take 2 tablets (650 mg total) by mouth every 6 (six) hours as needed. 30 tablet 0   Albuterol Sulfate (PROAIR RESPICLICK) 660 (90 Base) MCG/ACT AEPB Inhale 1-2 puffs into the lungs 4 (four) times daily as needed. 1 each 2   Cholecalciferol (HM VITAMIN D3) 100 MCG (4000 UT) CAPS 1 po qd 90 capsule 3   Cholecalciferol (VITAMIN D3) 25 MCG (1000 UT) CAPS Take 2,000 Units by mouth daily.     finasteride (PROSCAR) 5 MG tablet Take 1 tablet (5 mg total) by mouth daily. 100 tablet 3   Fluocin-Hydroquinone-Tretinoin (TRI-LUMA) 0.01-4-0.05 % CREA As directed 30 g 3   fluticasone (FLONASE) 50 MCG/ACT nasal spray Place 2 sprays into the nose daily. (Patient taking differently: Place 2 sprays into the nose as needed for allergies.) 16 g 10   hydrocortisone (ANUSOL-HC) 25 MG suppository PLACE 1 SUPPOSITORY (25 MG TOTAL) RECTALLY 2 (TWO) TIMES DAILY. 20 suppository 0   hydrocortisone-pramoxine (ANALPRAM-HC) 2.5-1 % rectal cream Place 1 application rectally 3 (three) times daily. 30 g 0   ibuprofen (ADVIL) 800 MG tablet TAKE 1 TABLET BY MOUTH EVERY 8 HOURS AS NEEDED 100 tablet 1   ipratropium (ATROVENT) 0.03 % nasal spray Place 2 sprays into both nostrils 2 (two) times daily. Do not use for more than 5days. 30 mL 0   ondansetron (ZOFRAN ODT) 8 MG disintegrating tablet Take 1 tablet (8 mg total) by mouth every 8 (eight) hours as needed for nausea or vomiting. 20 tablet 0   SUMAtriptan (IMITREX) 100 MG tablet Take 1 tablet (100 mg total) by mouth once. May repeat in 2 hours if headache persists or recurs. (Patient taking differently: Take 100 mg by mouth every 2 (two) hours  as needed for migraine. May repeat in 2 hours if headache persists or recurs.) 12 tablet 3   triamcinolone ointment (KENALOG) 0.1 % Apply 1 application topically 2 (two) times daily. 80 g 3   TRIBENZOR 40-10-25 MG TABS TAKE 1 TABLET BY MOUTH EVERY DAY 90 tablet 3   tadalafil (CIALIS) 20 MG tablet Take 1 tablet (20 mg total) by mouth daily as needed for erectile dysfunction. 12 tablet 5   pantoprazole (PROTONIX) 40 MG tablet Take 1 tablet (40 mg total) by mouth daily. (Patient not taking: Reported on 03/30/2021) 30 tablet 11   No facility-administered medications prior to visit.    ROS: Review of Systems  Constitutional:  Positive for fatigue. Negative for appetite change and unexpected weight change.  HENT:  Negative for congestion, nosebleeds, sneezing, sore throat and trouble swallowing.   Eyes:  Negative for itching and visual disturbance.  Respiratory:  Negative for cough.   Cardiovascular:  Negative for chest pain, palpitations and leg swelling.  Gastrointestinal:  Negative for abdominal distention, blood in stool, diarrhea and nausea.  Genitourinary:  Negative for frequency and hematuria.  Musculoskeletal:  Negative for back pain, gait problem, joint swelling and neck pain.  Skin:  Negative for rash.  Neurological:  Negative for dizziness, tremors, speech difficulty and weakness.  Psychiatric/Behavioral:  Positive for decreased concentration and dysphoric mood. Negative for agitation, behavioral problems, confusion, hallucinations, sleep disturbance  and suicidal ideas. The patient is not nervous/anxious.    Objective:  BP 132/80 (BP Location: Left Arm)   Pulse 99   Temp 98 F (36.7 C) (Oral)   Ht 5\' 7"  (1.702 m)   Wt 222 lb 6.4 oz (100.9 kg)   SpO2 97%   BMI 34.83 kg/m   BP Readings from Last 3 Encounters:  03/30/21 132/80  09/18/20 118/72  03/19/20 112/78    Wt Readings from Last 3 Encounters:  03/30/21 222 lb 6.4 oz (100.9 kg)  09/18/20 211 lb 9.6 oz (96 kg)   03/19/20 226 lb (102.5 kg)    Physical Exam Constitutional:      General: He is not in acute distress.    Appearance: He is well-developed. He is obese.     Comments: NAD  Eyes:     Conjunctiva/sclera: Conjunctivae normal.     Pupils: Pupils are equal, round, and reactive to light.  Neck:     Thyroid: No thyromegaly.     Vascular: No JVD.  Cardiovascular:     Rate and Rhythm: Normal rate and regular rhythm.     Heart sounds: Normal heart sounds. No murmur heard.   No friction rub. No gallop.  Pulmonary:     Effort: Pulmonary effort is normal. No respiratory distress.     Breath sounds: Normal breath sounds. No wheezing or rales.  Chest:     Chest wall: No tenderness.  Abdominal:     General: Bowel sounds are normal. There is no distension.     Palpations: Abdomen is soft. There is no mass.     Tenderness: There is no abdominal tenderness. There is no guarding or rebound.  Musculoskeletal:        General: No tenderness. Normal range of motion.     Cervical back: Normal range of motion.  Lymphadenopathy:     Cervical: No cervical adenopathy.  Skin:    General: Skin is warm and dry.     Findings: No rash.  Neurological:     Mental Status: He is alert and oriented to person, place, and time.     Cranial Nerves: No cranial nerve deficit.     Motor: No abnormal muscle tone.     Coordination: Coordination normal.     Gait: Gait normal.     Deep Tendon Reflexes: Reflexes are normal and symmetric.  Psychiatric:        Behavior: Behavior normal.        Thought Content: Thought content normal.        Judgment: Judgment normal.  Appears sad  Lab Results  Component Value Date   WBC 4.2 09/18/2020   HGB 14.3 09/18/2020   HCT 42.2 09/18/2020   PLT 320.0 09/18/2020   GLUCOSE 99 09/18/2020   CHOL 148 09/18/2020   TRIG 47.0 09/18/2020   HDL 39.40 09/18/2020   LDLCALC 99 09/18/2020   ALT 13 09/18/2020   AST 17 09/18/2020   NA 137 09/18/2020   K 3.6 09/18/2020   CL 102  09/18/2020   CREATININE 1.20 09/18/2020   BUN 20 09/18/2020   CO2 27 09/18/2020   TSH 1.97 09/18/2020   PSA 3.85 09/17/2019    No results found.  Assessment & Plan:   Problem List Items Addressed This Visit     B12 deficiency    Re-start B complex Check B12      Relevant Orders   Vitamin B12   Essential hypertension    BP Readings from  Last 3 Encounters:  03/30/21 132/80  09/18/20 118/72  03/19/20 112/78        Reactive depression (situational)    New Feeling burn out; working 12 h days x6-7/d weeks.Marland KitchenMarland KitchenTearful, sad.... Off work till 04/27/21 Start Wellbutrin XL 150 mg/d RTC 3-4 wks      Relevant Medications   buPROPion (WELLBUTRIN XL) 150 MG 24 hr tablet   Other Relevant Orders   TSH   Comprehensive metabolic panel   CBC with Differential/Platelet   Vitamin D deficiency    Re-start Vit D Risks associated with treatment noncompliance were discussed. Compliance was encouraged. Check Vit D       Relevant Orders   VITAMIN D 25 Hydroxy (Vit-D Deficiency, Fractures)      Meds ordered this encounter  Medications   buPROPion (WELLBUTRIN XL) 150 MG 24 hr tablet    Sig: Take 1 tablet (150 mg total) by mouth daily.    Dispense:  30 tablet    Refill:  5       Follow-up: Return in about 4 weeks (around 04/27/2021) for a follow-up visit.  Walker Kehr, MD

## 2021-03-30 NOTE — Assessment & Plan Note (Signed)
Re-start Vit D Risks associated with treatment noncompliance were discussed. Compliance was encouraged. Check Vit D

## 2021-03-30 NOTE — Assessment & Plan Note (Signed)
New Feeling burn out; working 12 h days x6-7/d weeks.Marland KitchenMarland KitchenTearful, sad.... Off work till 04/27/21 Start Wellbutrin XL 150 mg/d RTC 3-4 wks

## 2021-03-30 NOTE — Assessment & Plan Note (Signed)
Re-start B complex Check B12

## 2021-03-30 NOTE — Assessment & Plan Note (Signed)
BP Readings from Last 3 Encounters:  03/30/21 132/80  09/18/20 118/72  03/19/20 112/78

## 2021-04-03 ENCOUNTER — Telehealth: Payer: Self-pay | Admitting: Internal Medicine

## 2021-04-03 NOTE — Telephone Encounter (Signed)
Patient requesting forms not to be faxed  Patient states he will pick up the forms once completed

## 2021-04-03 NOTE — Telephone Encounter (Signed)
Type of form received [facility], FMLA, disability, handicapped placard, Surgical clearance)  Leave of absence  Form placed in (E-fax folder, Provider mailbox) Provider box  Additional instructions from the patient (mail, fax, notify by phone when complete)  Call when completed [phone]  Things to remember: [place] office: If form received in person, remind patient that forms take 7-10 business days CMA should attach charge sheet and put on Supervisor's desk

## 2021-04-03 NOTE — Telephone Encounter (Signed)
Place on MD desk in blue folder.Marland KitchenJohny Oliver

## 2021-04-06 NOTE — Telephone Encounter (Signed)
Done. Thanks.

## 2021-04-07 NOTE — Telephone Encounter (Signed)
Called pt there was no answer LMOM form ready for pick-up.Marland KitchenChryl Heck

## 2021-04-09 ENCOUNTER — Ambulatory Visit: Payer: 59 | Admitting: Internal Medicine

## 2021-04-30 ENCOUNTER — Other Ambulatory Visit: Payer: Self-pay

## 2021-04-30 ENCOUNTER — Ambulatory Visit: Payer: 59 | Admitting: Internal Medicine

## 2021-04-30 ENCOUNTER — Encounter: Payer: Self-pay | Admitting: Internal Medicine

## 2021-04-30 DIAGNOSIS — I10 Essential (primary) hypertension: Secondary | ICD-10-CM

## 2021-04-30 DIAGNOSIS — F329 Major depressive disorder, single episode, unspecified: Secondary | ICD-10-CM | POA: Diagnosis not present

## 2021-04-30 DIAGNOSIS — E559 Vitamin D deficiency, unspecified: Secondary | ICD-10-CM | POA: Diagnosis not present

## 2021-04-30 DIAGNOSIS — E538 Deficiency of other specified B group vitamins: Secondary | ICD-10-CM | POA: Diagnosis not present

## 2021-04-30 MED ORDER — B COMPLEX PLUS PO TABS: 1.0000 | ORAL_TABLET | Freq: Every day | ORAL | 3 refills | Status: AC

## 2021-04-30 MED ORDER — VITAMIN D3 50 MCG (2000 UT) PO CAPS: 2000.0000 [IU] | ORAL_CAPSULE | Freq: Every day | ORAL | 3 refills | Status: AC

## 2021-04-30 NOTE — Assessment & Plan Note (Signed)
Cont w/BP meds

## 2021-04-30 NOTE — Progress Notes (Signed)
Subjective:  Patient ID: Corey Oliver, male    DOB: 12/19/1965  Age: 55 y.o. MRN: [id number]  CC: Follow-up (4 week f/u)   HPI Corey Oliver presents for stress, HTN, HAs C/o OA pains    Outpatient Medications Prior to Visit  Medication Sig Dispense Refill   acetaminophen (TYLENOL) 325 MG tablet Take 2 tablets (650 mg total) by mouth every 6 (six) hours as needed. 30 tablet 0   Albuterol Sulfate (PROAIR RESPICLICK) 034 (90 Base) MCG/ACT AEPB Inhale 1-2 puffs into the lungs 4 (four) times daily as needed. 1 each 2   buPROPion (WELLBUTRIN XL) 150 MG 24 hr tablet Take 1 tablet (150 mg total) by mouth daily. 30 tablet 5   finasteride (PROSCAR) 5 MG tablet Take 1 tablet (5 mg total) by mouth daily. 100 tablet 3   Fluocin-Hydroquinone-Tretinoin (TRI-LUMA) 0.01-4-0.05 % CREA As directed 30 g 3   fluticasone (FLONASE) 50 MCG/ACT nasal spray Place 2 sprays into the nose daily. (Patient taking differently: Place 2 sprays into the nose as needed for allergies.) 16 g 10   hydrocortisone (ANUSOL-HC) 25 MG suppository PLACE 1 SUPPOSITORY (25 MG TOTAL) RECTALLY 2 (TWO) TIMES DAILY. 20 suppository 0   hydrocortisone-pramoxine (ANALPRAM-HC) 2.5-1 % rectal cream Place 1 application rectally 3 (three) times daily. 30 g 0   ibuprofen (ADVIL) 800 MG tablet TAKE 1 TABLET BY MOUTH EVERY 8 HOURS AS NEEDED 100 tablet 1   ipratropium (ATROVENT) 0.03 % nasal spray Place 2 sprays into both nostrils 2 (two) times daily. Do not use for more than 5days. 30 mL 0   ondansetron (ZOFRAN ODT) 8 MG disintegrating tablet Take 1 tablet (8 mg total) by mouth every 8 (eight) hours as needed for nausea or vomiting. 20 tablet 0   SUMAtriptan (IMITREX) 100 MG tablet Take 1 tablet (100 mg total) by mouth once. May repeat in 2 hours if headache persists or recurs. (Patient taking differently: Take 100 mg by mouth every 2 (two) hours as needed for migraine. May repeat in 2 hours if headache persists or recurs.) 12 tablet 3    triamcinolone ointment (KENALOG) 0.1 % Apply 1 application topically 2 (two) times daily. 80 g 3   TRIBENZOR 40-10-25 MG TABS TAKE 1 TABLET BY MOUTH EVERY DAY 90 tablet 1   Cholecalciferol (HM VITAMIN D3) 100 MCG (4000 UT) CAPS 1 po qd 90 capsule 3   Cholecalciferol (VITAMIN D3) 25 MCG (1000 UT) CAPS Take 2,000 Units by mouth daily.     tadalafil (CIALIS) 20 MG tablet Take 1 tablet (20 mg total) by mouth daily as needed for erectile dysfunction. 12 tablet 5   No facility-administered medications prior to visit.    ROS: Review of Systems  Objective:  BP 130/84 (BP Location: Left Arm)   Pulse 92   Temp 98 F (36.7 C) (Oral)   Ht 5\' 7"  (1.702 m)   Wt 216 lb 9.6 oz (98.2 kg)   SpO2 97%   BMI 33.92 kg/m   BP Readings from Last 3 Encounters:  04/30/21 130/84  03/30/21 132/80  09/18/20 118/72    Wt Readings from Last 3 Encounters:  04/30/21 216 lb 9.6 oz (98.2 kg)  03/30/21 222 lb 6.4 oz (100.9 kg)  09/18/20 211 lb 9.6 oz (96 kg)    Physical Exam  Lab Results  Component Value Date   WBC 4.2 09/18/2020   HGB 14.3 09/18/2020   HCT 42.2 09/18/2020   PLT 320.0 09/18/2020   GLUCOSE  99 09/18/2020   CHOL 148 09/18/2020   TRIG 47.0 09/18/2020   HDL 39.40 09/18/2020   LDLCALC 99 09/18/2020   ALT 13 09/18/2020   AST 17 09/18/2020   NA 137 09/18/2020   K 3.6 09/18/2020   CL 102 09/18/2020   CREATININE 1.20 09/18/2020   BUN 20 09/18/2020   CO2 27 09/18/2020   TSH 1.97 09/18/2020   PSA 3.85 09/17/2019    No results found.  Assessment & Plan:   Problem List Items Addressed This Visit     B12 deficiency    Cont w/B12      Essential hypertension    Cont w/BP meds      Reactive depression (situational)    Chronic stress/depression f/u. Feeling better but not back to normal... Cont w/Wellbutrin  He may not be able to work long hours       Vitamin D deficiency     Re- start Vit D prescription 50000 iu weekly          Meds ordered this encounter   Medications   B Complex-Folic Acid (B COMPLEX PLUS) TABS    Sig: Take 1 tablet by mouth daily.    Dispense:  100 tablet    Refill:  3   Cholecalciferol (VITAMIN D3) 50 MCG (2000 UT) capsule    Sig: Take 1 capsule (2,000 Units total) by mouth daily.    Dispense:  100 capsule    Refill:  3      Follow-up: No follow-ups on file.  Walker Kehr, MD

## 2021-04-30 NOTE — Patient Instructions (Addendum)
[employer] inserts  [employer] bondi shoes

## 2021-04-30 NOTE — Assessment & Plan Note (Signed)
Re- start Vit D prescription 50000 iu weekly

## 2021-04-30 NOTE — Progress Notes (Signed)
Subjective:  Patient ID: Corey Oliver, male    DOB: March 10, 1966  Age: 55 y.o. MRN: [id number]  CC: Follow-up (4 week f/u)   HPI Corey Oliver presents for a stress/depression f/u. Feeling better...  Outpatient Medications Prior to Visit  Medication Sig Dispense Refill   acetaminophen (TYLENOL) 325 MG tablet Take 2 tablets (650 mg total) by mouth every 6 (six) hours as needed. 30 tablet 0   Albuterol Sulfate (PROAIR RESPICLICK) 967 (90 Base) MCG/ACT AEPB Inhale 1-2 puffs into the lungs 4 (four) times daily as needed. 1 each 2   buPROPion (WELLBUTRIN XL) 150 MG 24 hr tablet Take 1 tablet (150 mg total) by mouth daily. 30 tablet 5   finasteride (PROSCAR) 5 MG tablet Take 1 tablet (5 mg total) by mouth daily. 100 tablet 3   Fluocin-Hydroquinone-Tretinoin (TRI-LUMA) 0.01-4-0.05 % CREA As directed 30 g 3   fluticasone (FLONASE) 50 MCG/ACT nasal spray Place 2 sprays into the nose daily. (Patient taking differently: Place 2 sprays into the nose as needed for allergies.) 16 g 10   hydrocortisone (ANUSOL-HC) 25 MG suppository PLACE 1 SUPPOSITORY (25 MG TOTAL) RECTALLY 2 (TWO) TIMES DAILY. 20 suppository 0   hydrocortisone-pramoxine (ANALPRAM-HC) 2.5-1 % rectal cream Place 1 application rectally 3 (three) times daily. 30 g 0   ibuprofen (ADVIL) 800 MG tablet TAKE 1 TABLET BY MOUTH EVERY 8 HOURS AS NEEDED 100 tablet 1   ipratropium (ATROVENT) 0.03 % nasal spray Place 2 sprays into both nostrils 2 (two) times daily. Do not use for more than 5days. 30 mL 0   ondansetron (ZOFRAN ODT) 8 MG disintegrating tablet Take 1 tablet (8 mg total) by mouth every 8 (eight) hours as needed for nausea or vomiting. 20 tablet 0   SUMAtriptan (IMITREX) 100 MG tablet Take 1 tablet (100 mg total) by mouth once. May repeat in 2 hours if headache persists or recurs. (Patient taking differently: Take 100 mg by mouth every 2 (two) hours as needed for migraine. May repeat in 2 hours if headache persists or recurs.) 12 tablet 3    triamcinolone ointment (KENALOG) 0.1 % Apply 1 application topically 2 (two) times daily. 80 g 3   TRIBENZOR 40-10-25 MG TABS TAKE 1 TABLET BY MOUTH EVERY DAY 90 tablet 1   Cholecalciferol (HM VITAMIN D3) 100 MCG (4000 UT) CAPS 1 po qd 90 capsule 3   Cholecalciferol (VITAMIN D3) 25 MCG (1000 UT) CAPS Take 2,000 Units by mouth daily.     tadalafil (CIALIS) 20 MG tablet Take 1 tablet (20 mg total) by mouth daily as needed for erectile dysfunction. 12 tablet 5   No facility-administered medications prior to visit.    ROS: Review of Systems  Constitutional:  Negative for appetite change, fatigue and unexpected weight change.  HENT:  Negative for congestion, nosebleeds, sneezing, sore throat and trouble swallowing.   Eyes:  Negative for itching and visual disturbance.  Respiratory:  Negative for cough.   Cardiovascular:  Negative for chest pain, palpitations and leg swelling.  Gastrointestinal:  Negative for abdominal distention, blood in stool, diarrhea and nausea.  Genitourinary:  Negative for frequency and hematuria.  Musculoskeletal:  Negative for back pain, gait problem, joint swelling and neck pain.  Skin:  Negative for rash.  Neurological:  Negative for dizziness, tremors, speech difficulty and weakness.  Psychiatric/Behavioral:  Positive for dysphoric mood. Negative for agitation and sleep disturbance. The patient is nervous/anxious.    Objective:  BP 130/84 (BP Location: Left Arm)  Pulse 92   Temp 98 F (36.7 C) (Oral)   Ht 5\' 7"  (1.702 m)   Wt 216 lb 9.6 oz (98.2 kg)   SpO2 97%   BMI 33.92 kg/m   BP Readings from Last 3 Encounters:  04/30/21 130/84  03/30/21 132/80  09/18/20 118/72    Wt Readings from Last 3 Encounters:  04/30/21 216 lb 9.6 oz (98.2 kg)  03/30/21 222 lb 6.4 oz (100.9 kg)  09/18/20 211 lb 9.6 oz (96 kg)    Physical Exam Constitutional:      General: He is not in acute distress.    Appearance: He is well-developed.     Comments: NAD  Eyes:      Conjunctiva/sclera: Conjunctivae normal.     Pupils: Pupils are equal, round, and reactive to light.  Neck:     Thyroid: No thyromegaly.     Vascular: No JVD.  Cardiovascular:     Rate and Rhythm: Normal rate and regular rhythm.     Heart sounds: Normal heart sounds. No murmur heard.   No friction rub. No gallop.  Pulmonary:     Effort: Pulmonary effort is normal. No respiratory distress.     Breath sounds: Normal breath sounds. No wheezing or rales.  Chest:     Chest wall: No tenderness.  Abdominal:     General: Bowel sounds are normal. There is no distension.     Palpations: Abdomen is soft. There is no mass.     Tenderness: There is no abdominal tenderness. There is no guarding or rebound.  Musculoskeletal:        General: No tenderness. Normal range of motion.     Cervical back: Normal range of motion.  Lymphadenopathy:     Cervical: No cervical adenopathy.  Skin:    General: Skin is warm and dry.     Findings: No rash.  Neurological:     Mental Status: He is alert and oriented to person, place, and time.     Cranial Nerves: No cranial nerve deficit.     Motor: No abnormal muscle tone.     Coordination: Coordination normal.     Gait: Gait normal.     Deep Tendon Reflexes: Reflexes are normal and symmetric.  Psychiatric:        Behavior: Behavior normal.        Thought Content: Thought content normal.        Judgment: Judgment normal.    Lab Results  Component Value Date   WBC 4.2 09/18/2020   HGB 14.3 09/18/2020   HCT 42.2 09/18/2020   PLT 320.0 09/18/2020   GLUCOSE 99 09/18/2020   CHOL 148 09/18/2020   TRIG 47.0 09/18/2020   HDL 39.40 09/18/2020   LDLCALC 99 09/18/2020   ALT 13 09/18/2020   AST 17 09/18/2020   NA 137 09/18/2020   K 3.6 09/18/2020   CL 102 09/18/2020   CREATININE 1.20 09/18/2020   BUN 20 09/18/2020   CO2 27 09/18/2020   TSH 1.97 09/18/2020   PSA 3.85 09/17/2019    No results found.  Assessment & Plan:   Problem List Items  Addressed This Visit     B12 deficiency    Cont w/B12      Reactive depression (situational)    Chronic stress/depression f/u. Feeling better but not back to normal... Cont w/Wellbutrin  He may not be able to work long hours       Vitamin D deficiency     Re-  start Vit D prescription 50000 iu weekly          Meds ordered this encounter  Medications   B Complex-Folic Acid (B COMPLEX PLUS) TABS    Sig: Take 1 tablet by mouth daily.    Dispense:  100 tablet    Refill:  3   Cholecalciferol (VITAMIN D3) 50 MCG (2000 UT) capsule    Sig: Take 1 capsule (2,000 Units total) by mouth daily.    Dispense:  100 capsule    Refill:  3      Follow-up: No follow-ups on file.  Walker Kehr, MD

## 2021-04-30 NOTE — Assessment & Plan Note (Signed)
Cont w/B12 

## 2021-04-30 NOTE — Assessment & Plan Note (Signed)
Chronic stress/depression f/u. Feeling better but not back to normal... Cont w/Wellbutrin  He may not be able to work long hours

## 2021-05-25 ENCOUNTER — Other Ambulatory Visit: Payer: Self-pay | Admitting: Internal Medicine

## 2021-06-01 ENCOUNTER — Telehealth: Payer: Self-pay

## 2021-06-01 NOTE — Telephone Encounter (Addendum)
Pt calling in [facility] coughing a lot, colored mucus since 1/6 worsens at night. COVID (-) 05/28/21. Pt stated that his friend had gotten a Rx for Benzonatate and they have the same symptoms as he done. Pt sat up an appt for 1/23 to see Dr. Alain Marion for his matter. Pt declined seeing another provider.   Pt is requesting a call back from Nurse.

## 2021-06-01 NOTE — Telephone Encounter (Signed)
Pls see another provider's alert. Thanks

## 2021-06-02 NOTE — Telephone Encounter (Signed)
Patient went to Urgent Care and was prescribed an Z-pack

## 2021-06-15 ENCOUNTER — Ambulatory Visit: Payer: 59 | Admitting: Internal Medicine

## 2021-07-01 ENCOUNTER — Other Ambulatory Visit: Payer: Self-pay | Admitting: Internal Medicine

## 2021-07-09 ENCOUNTER — Other Ambulatory Visit: Payer: Self-pay | Admitting: Internal Medicine

## 2021-07-17 ENCOUNTER — Ambulatory Visit: Payer: 59 | Admitting: Internal Medicine

## 2021-07-17 ENCOUNTER — Ambulatory Visit: Payer: 59

## 2021-07-28 ENCOUNTER — Other Ambulatory Visit: Payer: Self-pay

## 2021-07-28 ENCOUNTER — Ambulatory Visit: Payer: 59 | Admitting: Internal Medicine

## 2021-07-28 ENCOUNTER — Encounter: Payer: Self-pay | Admitting: Internal Medicine

## 2021-07-28 VITALS — BP 118/82 | HR 97 | Temp 98.5°F | Ht 67.0 in | Wt 226.0 lb

## 2021-07-28 DIAGNOSIS — N320 Bladder-neck obstruction: Secondary | ICD-10-CM | POA: Diagnosis not present

## 2021-07-28 DIAGNOSIS — N529 Male erectile dysfunction, unspecified: Secondary | ICD-10-CM

## 2021-07-28 DIAGNOSIS — E291 Testicular hypofunction: Secondary | ICD-10-CM | POA: Diagnosis not present

## 2021-07-28 DIAGNOSIS — Z1211 Encounter for screening for malignant neoplasm of colon: Secondary | ICD-10-CM

## 2021-07-28 DIAGNOSIS — R0683 Snoring: Secondary | ICD-10-CM

## 2021-07-28 NOTE — Assessment & Plan Note (Signed)
Corey Oliver will sch w/Dr Amalia Hailey, his urologist ?

## 2021-07-28 NOTE — Assessment & Plan Note (Signed)
Refractory ?Averey will sch w/Dr Amalia Hailey, his urologist ?

## 2021-07-28 NOTE — Assessment & Plan Note (Signed)
Worse ?ENT ref - Dr Constance Holster ?

## 2021-07-28 NOTE — Progress Notes (Signed)
? ?Subjective:  ?Patient ID: CHRISTOPHE RISING, male    DOB: 1966/03/26  Age: 56 y.o. MRN: [id number] ? ?CC: No chief complaint on file. ? ? ?HPI ?[name] presents for sinus congestion, snoring ?F/u on stress ?C/o ED ? ? ?Outpatient Medications Prior to Visit  ?Medication Sig Dispense Refill  ? acetaminophen (TYLENOL) 325 MG tablet Take 2 tablets (650 mg total) by mouth every 6 (six) hours as needed. 30 tablet 0  ? Albuterol Sulfate (PROAIR RESPICLICK) 983 (90 Base) MCG/ACT AEPB Inhale 1-2 puffs into the lungs 4 (four) times daily as needed. 1 each 2  ? B Complex-Folic Acid (B COMPLEX PLUS) TABS Take 1 tablet by mouth daily. 100 tablet 3  ? buPROPion (WELLBUTRIN XL) 150 MG 24 hr tablet Take 1 tablet (150 mg total) by mouth daily. 30 tablet 5  ? Cholecalciferol (VITAMIN D3) 50 MCG (2000 UT) capsule Take 1 capsule (2,000 Units total) by mouth daily. 100 capsule 3  ? dexlansoprazole (DEXILANT) 60 MG capsule TAKE 1 CAPSULE BY MOUTH AS NEEDED 90 capsule 3  ? finasteride (PROSCAR) 5 MG tablet Take 1 tablet (5 mg total) by mouth daily. 100 tablet 3  ? Fluocin-Hydroquinone-Tretinoin (TRI-LUMA) 0.01-4-0.05 % CREA As directed 30 g 3  ? fluticasone (FLONASE) 50 MCG/ACT nasal spray Place 2 sprays into the nose daily. (Patient taking differently: Place 2 sprays into the nose as needed for allergies.) 16 g 10  ? hydrocortisone-pramoxine (ANALPRAM-HC) 2.5-1 % rectal cream Place 1 application rectally 3 (three) times daily. 30 g 0  ? ibuprofen (ADVIL) 800 MG tablet TAKE 1 TABLET BY MOUTH EVERY 8 HOURS AS NEEDED 100 tablet 0  ? ipratropium (ATROVENT) 0.03 % nasal spray Place 2 sprays into both nostrils 2 (two) times daily. Do not use for more than 5days. 30 mL 0  ? ondansetron (ZOFRAN ODT) 8 MG disintegrating tablet Take 1 tablet (8 mg total) by mouth every 8 (eight) hours as needed for nausea or vomiting. 20 tablet 0  ? SUMAtriptan (IMITREX) 100 MG tablet Take 1 tablet (100 mg total) by mouth once. May repeat in 2 hours if  headache persists or recurs. (Patient taking differently: Take 100 mg by mouth every 2 (two) hours as needed for migraine. May repeat in 2 hours if headache persists or recurs.) 12 tablet 3  ? triamcinolone ointment (KENALOG) 0.1 % Apply 1 application topically 2 (two) times daily. 80 g 3  ? TRIBENZOR 40-10-25 MG TABS TAKE 1 TABLET BY MOUTH EVERY DAY 90 tablet 1  ? finasteride (PROSCAR) 5 MG tablet Take 0.5 tablets by mouth daily.    ? tadalafil (CIALIS) 20 MG tablet Take 1 tablet (20 mg total) by mouth daily as needed for erectile dysfunction. 12 tablet 5  ? ?No facility-administered medications prior to visit.  ? ? ?ROS: ?Review of Systems  ?Constitutional:  Negative for appetite change, fatigue and unexpected weight change.  ?HENT:  Positive for congestion, sinus pressure and voice change. Negative for nosebleeds, sneezing, sore throat and trouble swallowing.   ?Eyes:  Negative for itching and visual disturbance.  ?Respiratory:  Negative for cough.   ?Cardiovascular:  Negative for chest pain, palpitations and leg swelling.  ?Gastrointestinal:  Negative for abdominal distention, blood in stool, diarrhea and nausea.  ?Genitourinary:  Negative for frequency and hematuria.  ?Musculoskeletal:  Negative for back pain, gait problem, joint swelling and neck pain.  ?Skin:  Negative for rash.  ?Neurological:  Negative for dizziness, tremors, speech difficulty and weakness.  ?  Psychiatric/Behavioral:  Negative for agitation, dysphoric mood, sleep disturbance and suicidal ideas. The patient is nervous/anxious.   ? ?Objective:  ?BP 118/82 (BP Location: Left Arm, Patient Position: Sitting, Cuff Size: Large)   Pulse 97   Temp 98.5 ?F (36.9 ?C) (Oral)   Ht '5\' 7"'$  (1.702 m)   Wt 226 lb (102.5 kg)   SpO2 97%   BMI 35.40 kg/m?  ? ?BP Readings from Last 3 Encounters:  ?07/28/21 118/82  ?04/30/21 130/84  ?03/30/21 132/80  ? ? ?Wt Readings from Last 3 Encounters:  ?07/28/21 226 lb (102.5 kg)  ?04/30/21 216 lb 9.6 oz (98.2 kg)   ?03/30/21 222 lb 6.4 oz (100.9 kg)  ? ? ?Physical Exam ?Constitutional:   ?   General: He is not in acute distress. ?   Appearance: He is well-developed. He is obese.  ?   Comments: NAD  ?Eyes:  ?   Conjunctiva/sclera: Conjunctivae normal.  ?   Pupils: Pupils are equal, round, and reactive to light.  ?Neck:  ?   Thyroid: No thyromegaly.  ?   Vascular: No JVD.  ?Cardiovascular:  ?   Rate and Rhythm: Normal rate and regular rhythm.  ?   Heart sounds: Normal heart sounds. No murmur heard. ?  No friction rub. No gallop.  ?Pulmonary:  ?   Effort: Pulmonary effort is normal. No respiratory distress.  ?   Breath sounds: Normal breath sounds. No wheezing or rales.  ?Chest:  ?   Chest wall: No tenderness.  ?Abdominal:  ?   General: Bowel sounds are normal. There is no distension.  ?   Palpations: Abdomen is soft. There is no mass.  ?   Tenderness: There is no abdominal tenderness. There is no guarding or rebound.  ?Musculoskeletal:     ?   General: No tenderness. Normal range of motion.  ?   Cervical back: Normal range of motion.  ?Lymphadenopathy:  ?   Cervical: No cervical adenopathy.  ?Skin: ?   General: Skin is warm and dry.  ?   Findings: No rash.  ?Neurological:  ?   Mental Status: He is alert and oriented to person, place, and time.  ?   Cranial Nerves: No cranial nerve deficit.  ?   Motor: No abnormal muscle tone.  ?   Coordination: Coordination normal.  ?   Gait: Gait normal.  ?   Deep Tendon Reflexes: Reflexes are normal and symmetric.  ?Psychiatric:     ?   Behavior: Behavior normal.     ?   Thought Content: Thought content normal.     ?   Judgment: Judgment normal.  ?Long uvula ? ?Lab Results  ?Component Value Date  ? WBC 4.2 09/18/2020  ? HGB 14.3 09/18/2020  ? HCT 42.2 09/18/2020  ? PLT 320.0 09/18/2020  ? GLUCOSE 99 09/18/2020  ? CHOL 148 09/18/2020  ? TRIG 47.0 09/18/2020  ? HDL 39.40 09/18/2020  ? [place] 99 09/18/2020  ? ALT 13 09/18/2020  ? AST 17 09/18/2020  ? NA 137 09/18/2020  ? K 3.6 09/18/2020  ? CL  102 09/18/2020  ? CREATININE 1.20 09/18/2020  ? BUN 20 09/18/2020  ? CO2 27 09/18/2020  ? TSH 1.97 09/18/2020  ? PSA 3.85 09/17/2019  ? ? ?No results found. ? ?Assessment & Plan:  ? ?Problem List Items Addressed This Visit   ? ? Bladder neck obstruction  ?  Kendry will sch w/Dr Amalia Hailey, his urologist ?  ?  ? Erectile  dysfunction  ?  Refractory ?Osias will sch w/Dr Amalia Hailey, his urologist ?  ?  ? Hypogonadism in male  ?  Tyrel will sch w/Dr Amalia Hailey, his urologist ?  ?  ? Snoring  ?  Worse ?ENT ref - Dr Constance Holster ?  ?  ? Relevant Orders  ? Ambulatory referral to ENT  ? ?Other Visit Diagnoses   ? ? Colon cancer screening    -  Primary  ? Relevant Orders  ? Ambulatory referral to Gastroenterology  ? ?  ?  ? ? ?No orders of the defined types were placed in this encounter. ?  ? ? ?Follow-up: No follow-ups on file. ? ?Walker Kehr, MD ?

## 2021-08-03 ENCOUNTER — Encounter: Payer: Self-pay | Admitting: Gastroenterology

## 2021-08-10 ENCOUNTER — Other Ambulatory Visit: Payer: Self-pay

## 2021-08-10 ENCOUNTER — Other Ambulatory Visit: Payer: Self-pay | Admitting: Internal Medicine

## 2021-08-10 ENCOUNTER — Encounter: Payer: Self-pay | Admitting: Internal Medicine

## 2021-08-10 ENCOUNTER — Other Ambulatory Visit (INDEPENDENT_AMBULATORY_CARE_PROVIDER_SITE_OTHER): Payer: 59

## 2021-08-10 DIAGNOSIS — F329 Major depressive disorder, single episode, unspecified: Secondary | ICD-10-CM

## 2021-08-10 DIAGNOSIS — N320 Bladder-neck obstruction: Secondary | ICD-10-CM

## 2021-08-10 DIAGNOSIS — E538 Deficiency of other specified B group vitamins: Secondary | ICD-10-CM

## 2021-08-10 DIAGNOSIS — E559 Vitamin D deficiency, unspecified: Secondary | ICD-10-CM

## 2021-08-10 LAB — CBC WITH DIFFERENTIAL/PLATELET
Basophils Absolute: 0.1 10*3/uL (ref 0.0–0.1)
Basophils Relative: 1.9 % (ref 0.0–3.0)
Eosinophils Absolute: 0.1 10*3/uL (ref 0.0–0.7)
Eosinophils Relative: 2.9 % (ref 0.0–5.0)
HCT: 43 % (ref 39.0–52.0)
Hemoglobin: 14.5 g/dL (ref 13.0–17.0)
Lymphocytes Relative: 30.9 % (ref 12.0–46.0)
Lymphs Abs: 1.1 10*3/uL (ref 0.7–4.0)
MCHC: 33.7 g/dL (ref 30.0–36.0)
MCV: 88.4 fl (ref 78.0–100.0)
Monocytes Absolute: 0.4 10*3/uL (ref 0.1–1.0)
Monocytes Relative: 11.9 % (ref 3.0–12.0)
Neutro Abs: 1.9 10*3/uL (ref 1.4–7.7)
Neutrophils Relative %: 52.4 % (ref 43.0–77.0)
Platelets: 271 10*3/uL (ref 150.0–400.0)
RBC: 4.86 Mil/uL (ref 4.22–5.81)
RDW: 13.6 % (ref 11.5–15.5)
WBC: 3.7 10*3/uL — ABNORMAL LOW (ref 4.0–10.5)

## 2021-08-10 LAB — COMPREHENSIVE METABOLIC PANEL
ALT: 17 U/L (ref 0–53)
AST: 21 U/L (ref 0–37)
Albumin: 4.2 g/dL (ref 3.5–5.2)
Alkaline Phosphatase: 50 U/L (ref 39–117)
BUN: 12 mg/dL (ref 6–23)
CO2: 27 mEq/L (ref 19–32)
Calcium: 9.6 mg/dL (ref 8.4–10.5)
Chloride: 103 mEq/L (ref 96–112)
Creatinine, Ser: 1.18 mg/dL (ref 0.40–1.50)
GFR: 69.24 mL/min (ref >60.00)
Glucose, Bld: 129 mg/dL — ABNORMAL HIGH (ref 70–99)
Potassium: 3.6 mEq/L (ref 3.5–5.1)
Sodium: 137 mEq/L (ref 135–145)
Total Bilirubin: 0.4 mg/dL (ref 0.2–1.2)
Total Protein: 7.2 g/dL (ref 6.0–8.3)

## 2021-08-10 LAB — TSH: TSH: 2.3 u[IU]/mL (ref 0.35–5.50)

## 2021-08-10 LAB — VITAMIN B12: Vitamin B-12: 551 pg/mL (ref 211–911)

## 2021-08-10 LAB — VITAMIN D 25 HYDROXY (VIT D DEFICIENCY, FRACTURES): VITD: 76.48 ng/mL (ref 30.00–100.00)

## 2021-08-10 NOTE — Progress Notes (Signed)
Pt requested blood type test. He knows it may not be covered ?

## 2021-08-11 LAB — ABO/RH: Rh Factor: POSITIVE

## 2021-08-24 DIAGNOSIS — Z0279 Encounter for issue of other medical certificate: Secondary | ICD-10-CM

## 2021-08-26 ENCOUNTER — Other Ambulatory Visit: Payer: Self-pay | Admitting: Internal Medicine

## 2021-08-31 ENCOUNTER — Telehealth: Payer: Self-pay

## 2021-08-31 NOTE — Telephone Encounter (Signed)
Pt is calling about his FMLA pw to see when he can come to pick it up. ? ?Please call pt when ready. ?

## 2021-09-03 ENCOUNTER — Encounter: Payer: Self-pay | Admitting: Internal Medicine

## 2021-09-04 NOTE — Telephone Encounter (Signed)
Done. Thanks.

## 2021-09-04 NOTE — Telephone Encounter (Signed)
Forms was given to Corey Oliver/front desk on yesterday. Pt was notified forms are ready.Marland KitchenJohny Chess ?

## 2021-09-14 ENCOUNTER — Encounter: Payer: Self-pay | Admitting: Gastroenterology

## 2021-09-22 ENCOUNTER — Telehealth: Payer: Self-pay | Admitting: *Deleted

## 2021-09-22 ENCOUNTER — Ambulatory Visit: Payer: 59

## 2021-09-22 NOTE — Telephone Encounter (Signed)
Called pt he answered at 2 nd call,pt. At work he rescheduled previsit for 10/16/21 '@1330'$  and procedure at 10/23/21 because he was going to be out of town on 10/14/21 ?

## 2021-10-08 DIAGNOSIS — L249 Irritant contact dermatitis, unspecified cause: Secondary | ICD-10-CM | POA: Insufficient documentation

## 2021-10-14 ENCOUNTER — Encounter: Payer: 59 | Admitting: Gastroenterology

## 2021-10-23 ENCOUNTER — Encounter: Payer: 59 | Admitting: Gastroenterology

## 2021-10-26 ENCOUNTER — Ambulatory Visit (AMBULATORY_SURGERY_CENTER): Payer: Self-pay

## 2021-10-26 VITALS — Ht 67.0 in | Wt 224.0 lb

## 2021-10-26 DIAGNOSIS — Z1211 Encounter for screening for malignant neoplasm of colon: Secondary | ICD-10-CM

## 2021-10-26 MED ORDER — ONDANSETRON HCL 4 MG PO TABS: 4.0000 mg | ORAL_TABLET | ORAL | 0 refills | Status: AC

## 2021-10-26 NOTE — Progress Notes (Signed)
No egg or soy allergy known to patient  No issues known to pt with past sedation with any surgeries or procedures Patient denies ever being told they had issues or difficulty with intubation  No FH of Malignant Hyperthermia Pt is not on diet pills Pt is not on  home 02  Pt is not on blood thinners  Pt denies issues with constipation  No A fib or A flutter   NO PA's for preps discussed with pt In PV today  Discussed with pt there will be an out-of-pocket cost for prep and that varies from $0 to 70 +  dollars - pt verbalized understanding  Pt instructed to use Singlecare.com or GoodRx for a price reduction on prep   PV completed IN PERSON.  Pt verified name, DOB, address and insurance during PV today.   Pt encouraged to call with questions or issues.   If pt has My chart, procedure instructions ALSO sent via My Chart  Insurance confirmed with pt at [facility] today

## 2021-10-27 ENCOUNTER — Other Ambulatory Visit: Payer: Self-pay | Admitting: Internal Medicine

## 2021-11-06 ENCOUNTER — Telehealth: Payer: Self-pay | Admitting: Gastroenterology

## 2021-11-06 DIAGNOSIS — Z1211 Encounter for screening for malignant neoplasm of colon: Secondary | ICD-10-CM

## 2021-11-06 MED ORDER — NA SULFATE-K SULFATE-MG SULF 17.5-3.13-1.6 GM/177ML PO SOLN: 1.0000 | ORAL | 0 refills | Status: DC

## 2021-11-06 NOTE — Telephone Encounter (Signed)
Sent suprep Rx to pharmacy per pt's request.

## 2021-11-06 NOTE — Telephone Encounter (Signed)
Patient went by the pharmacy and his prep was not there.  He was told they did not get the prescription for the Suprep.  Please call it into [facility] on [address] in [place].  Thank you.

## 2021-11-17 ENCOUNTER — Encounter: Payer: Self-pay | Admitting: Gastroenterology

## 2021-11-20 ENCOUNTER — Other Ambulatory Visit: Payer: Self-pay | Admitting: Internal Medicine

## 2021-11-20 ENCOUNTER — Ambulatory Visit (AMBULATORY_SURGERY_CENTER): Payer: 59 | Admitting: Gastroenterology

## 2021-11-20 ENCOUNTER — Encounter: Payer: Self-pay | Admitting: Gastroenterology

## 2021-11-20 VITALS — BP 139/94 | HR 77 | Temp 98.6°F | Resp 17 | Ht 67.0 in | Wt 224.0 lb

## 2021-11-20 DIAGNOSIS — D125 Benign neoplasm of sigmoid colon: Secondary | ICD-10-CM | POA: Diagnosis not present

## 2021-11-20 DIAGNOSIS — Z1211 Encounter for screening for malignant neoplasm of colon: Secondary | ICD-10-CM | POA: Diagnosis not present

## 2021-11-20 MED ORDER — SODIUM CHLORIDE 0.9 % IV SOLN: 500.0000 mL | Freq: Once | INTRAVENOUS | Status: DC

## 2021-11-20 NOTE — Progress Notes (Signed)
Called to room to assist during endoscopic procedure.  Patient ID and intended procedure confirmed with present staff. Received instructions for my participation in the procedure from the performing physician.  

## 2021-11-20 NOTE — Progress Notes (Signed)
HPI: This is a man at routine risk for [facility]  Colonoscopy 06/2011 Dr. Ardis Hughs found single subCM non precancerous polyp, external hemorrhoids.   ROS: complete GI ROS as described in HPI, all other review negative.  Constitutional:  No unintentional weight loss   Past Medical History:  Diagnosis Date   Allergy    seasonal-pollen   Cystic acne    GERD (gastroesophageal reflux disease)    Hemorrhoids    Hypertension    IBS (irritable bowel syndrome)    Influenza 05/23/2012   Sleep apnea    URI (upper respiratory infection)     Past Surgical History:  Procedure Laterality Date   APPENDECTOMY     arthroscopic surgery left shoulder     for bone spurs jan '11   COLONOSCOPY     2013    Current Outpatient Medications  Medication Sig Dispense Refill   acetaminophen (TYLENOL) 325 MG tablet Take 2 tablets (650 mg total) by mouth every 6 (six) hours as needed. 30 tablet 0   Albuterol Sulfate (PROAIR RESPICLICK) 712 (90 Base) MCG/ACT AEPB Inhale 1-2 puffs into the lungs 4 (four) times daily as needed. 1 each 2   B Complex-Folic Acid (B COMPLEX PLUS) TABS Take 1 tablet by mouth daily. 100 tablet 3   buPROPion (WELLBUTRIN XL) 150 MG 24 hr tablet TAKE 1 TABLET BY MOUTH EVERY DAY 30 tablet 5   Cholecalciferol (VITAMIN D3) 50 MCG (2000 UT) capsule Take 1 capsule (2,000 Units total) by mouth daily. 100 capsule 3   clobetasol ointment (TEMOVATE) 0.05 % Apply topically 3 (three) times a week.     Clobetasol Propionate 0.05 % shampoo Apply topically.     dexlansoprazole (DEXILANT) 60 MG capsule TAKE 1 CAPSULE BY MOUTH AS NEEDED 90 capsule 3   finasteride (PROSCAR) 5 MG tablet Take 0.5 tablets by mouth daily.     Fluocin-Hydroquinone-Tretinoin (TRI-LUMA) 0.01-4-0.05 % CREA As directed 30 g 3   fluticasone (FLONASE) 50 MCG/ACT nasal spray Place 2 sprays into the nose daily. (Patient taking differently: Place 2 sprays into the nose as needed for allergies.) 16 g 10   hydrocortisone-pramoxine  (ANALPRAM-HC) 2.5-1 % rectal cream Place 1 application rectally 3 (three) times daily. 30 g 0   hydroquinone 4 % cream Apply topically at bedtime.     ibuprofen (ADVIL) 800 MG tablet TAKE 1 TABLET BY MOUTH EVERY 8 HOURS AS NEEDED 100 tablet 0   ipratropium (ATROVENT) 0.03 % nasal spray Place 2 sprays into both nostrils 2 (two) times daily. Do not use for more than 5days. (Patient taking differently: Place 2 sprays into both nostrils 2 (two) times daily as needed. Do not use for more than 5days.) 30 mL 0   Na Sulfate-K Sulfate-Mg Sulf 17.5-3.13-1.6 GM/177ML SOLN Take 1 kit by mouth as directed. May use generic SUPREP;NO prior authorizations will be done.Please use Singlecare or GOOD-RX coupon. 354 mL 0   ondansetron (ZOFRAN ODT) 8 MG disintegrating tablet Take 1 tablet (8 mg total) by mouth every 8 (eight) hours as needed for nausea or vomiting. 20 tablet 0   ondansetron (ZOFRAN) 4 MG tablet Take 1 tablet (4 mg total) by mouth as directed. Take one Zofran 4 mg tablet 30-60 minutes before each prep dose 2 tablet 0   SUMAtriptan (IMITREX) 100 MG tablet Take 1 tablet (100 mg total) by mouth once. May repeat in 2 hours if headache persists or recurs. (Patient taking differently: Take 100 mg by mouth every 2 (two) hours as needed  for migraine. May repeat in 2 hours if headache persists or recurs.) 12 tablet 3   tadalafil (CIALIS) 20 MG tablet Take 1 tablet (20 mg total) by mouth daily as needed for erectile dysfunction. 12 tablet 5   triamcinolone ointment (KENALOG) 0.1 % Apply 1 application topically 2 (two) times daily. 80 g 3   TRIBENZOR 40-10-25 MG TABS TAKE 1 TABLET BY MOUTH EVERY DAY 90 tablet 1   Current Facility-Administered Medications  Medication Dose Route Frequency Provider Last Rate Last Admin   0.9 %  sodium chloride infusion  500 mL Intravenous Once Milus Banister, MD        Allergies as of 11/20/2021   (No Known Allergies)    Family History  Problem Relation Age of Onset   Ovarian  cancer Mother    Colon cancer Neg Hx    Colon polyps Neg Hx    Esophageal cancer Neg Hx    Rectal cancer Neg Hx    Stomach cancer Neg Hx     Social History   Socioeconomic History   Marital status: Married    Spouse name: Not on file   Number of children: 1   Years of education: 16   Highest education level: Not on file  Occupational History    Employer: [employer]    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 Married '94. 1 daughter-'2002.  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    Social Determinants of Health   Financial Resource Strain: Not on file  Food Insecurity: Not on file  Transportation Needs: Not on file  Physical Activity: Not on file  Stress: Not on file  Social Connections: Not on file  Intimate Partner Violence: Not on file     Physical Exam: BP (!) 142/88   Pulse 88   Temp 98.6 F (37 C) (Skin)   Ht _0  (1.702 m)   Wt 224 lb (101.6 kg)   SpO2 97%   BMI 35.08 kg/m  Constitutional: generally well-appearing Psychiatric: alert and oriented x3 Lungs: CTA bilaterally Heart: no MCR  Assessment and plan: 56 y.o. male with routine risk for CRC  Screening colonoscopy today  Care is appropriate for the ambulatory setting.  Owens Loffler, MD [facility] 11/20/2021, 9:59 AM

## 2021-11-20 NOTE — Progress Notes (Signed)
Pt's states no medical or surgical changes since previsit or office visit. VS assessed by C.W 

## 2021-11-20 NOTE — Patient Instructions (Signed)
Handouts Provided:  Polyps  YOU HAD AN ENDOSCOPIC PROCEDURE TODAY AT THE [facility]:   Refer to the procedure report that was given to you for any specific questions about what was found during the examination.  If the procedure report does not answer your questions, please call your gastroenterologist to clarify.  If you requested that your care partner not be given the details of your procedure findings, then the procedure report has been included in a sealed envelope for you to review at your convenience later.  YOU SHOULD EXPECT: Some feelings of bloating in the abdomen. Passage of more gas than usual.  Walking can help get rid of the air that was put into your GI tract during the procedure and reduce the bloating. If you had a lower endoscopy (such as a colonoscopy or flexible sigmoidoscopy) you may notice spotting of blood in your stool or on the toilet paper. If you underwent a bowel prep for your procedure, you may not have a normal bowel movement for a few days.  Please Note:  You might notice some irritation and congestion in your nose or some drainage.  This is from the oxygen used during your procedure.  There is no need for concern and it should clear up in a day or so.  SYMPTOMS TO REPORT IMMEDIATELY:  Following lower endoscopy (colonoscopy or flexible sigmoidoscopy):  Excessive amounts of blood in the stool  Significant tenderness or worsening of abdominal pains  Swelling of the abdomen that is new, acute  Fever of 100F or higher  For urgent or emergent issues, a gastroenterologist can be reached at any hour by calling [phone]. Do not use MyChart messaging for urgent concerns.    DIET:  We do recommend a small meal at first, but then you may proceed to your regular diet.  Drink plenty of fluids but you should avoid alcoholic beverages for 24 hours.  ACTIVITY:  You should plan to take it easy for the rest of today and you should NOT DRIVE or use heavy  machinery until tomorrow (because of the sedation medicines used during the test).    FOLLOW UP: Our staff will call the number listed on your records the next business day following your procedure.  We will call around 7:15- 8:00 am to check on you and address any questions or concerns that you may have regarding the information given to you following your procedure. If we do not reach you, we will leave a message.  If you develop any symptoms (ie: fever, flu-like symptoms, shortness of breath, cough etc.) before then, please call [phone].  If you test positive for Covid 19 in the 2 weeks post procedure, please call and report this information to us.    If any biopsies were taken you will be contacted by phone or by letter within the next 1-3 weeks.  Please call us at [phone] if you have not heard about the biopsies in 3 weeks.    SIGNATURES/CONFIDENTIALITY: You and/or your care partner have signed paperwork which will be entered into your electronic medical record.  These signatures attest to the fact that that the information above on your After Visit Summary has been reviewed and is understood.  Full responsibility of the confidentiality of this discharge information lies with you and/or your care-partner.  

## 2021-11-20 NOTE — Op Note (Signed)
[facility] Patient Name: Corey Oliver Procedure Date: 11/20/2021 10:34 AM MRN: [id number] Endoscopist: Milus Banister , MD Age: 56 Referring MD:  Date of Birth: [birth year] Gender: Male Account #: [id number] Procedure:                Colonoscopy Indications:              Screening for colorectal malignant neoplasm;                            Colonoscopy 06/2011 Dr. Ardis Hughs found single subCM                            non precancerous polyp, external hemorrhoids Medicines:                Monitored Anesthesia Care Procedure:                Pre-Anesthesia Assessment:                           - Prior to the procedure, a History and Physical                            was performed, and patient medications and                            allergies were reviewed. The patient's tolerance of                            previous anesthesia was also reviewed. The risks                            and benefits of the procedure and the sedation                            options and risks were discussed with the patient.                            All questions were answered, and informed consent                            was obtained. Prior Anticoagulants: The patient has                            taken no previous anticoagulant or antiplatelet                            agents. ASA Grade Assessment: II - A patient with                            mild systemic disease. After reviewing the risks                            and benefits, the patient was deemed in  satisfactory condition to undergo the procedure.                           After obtaining informed consent, the colonoscope                            was passed under direct vision. Throughout the                            procedure, the patient's blood pressure, pulse, and                            oxygen saturations were monitored continuously. The                            Olympus CF-HQ190L  [id number]) Colonoscope was                            introduced through the anus and advanced to the the                            cecum, identified by appendiceal orifice and                            ileocecal valve. The colonoscopy was performed                            without difficulty. The patient tolerated the                            procedure well. The quality of the bowel                            preparation was good. The ileocecal valve,                            appendiceal orifice, and rectum were photographed. Scope In: 10:39:11 AM Scope Out: 10:49:04 AM Scope Withdrawal Time: 0 hours 8 minutes 0 seconds  Total Procedure Duration: 0 hours 9 minutes 53 seconds  Findings:                 A 3 mm polyp was found in the sigmoid colon. The                            polyp was sessile. The polyp was removed with a                            cold snare. Resection and retrieval were complete.                           A few small-mouthed diverticula were found in the                            left colon.  External and internal hemorrhoids were found. The                            hemorrhoids were small and medium-sized.                           The exam was otherwise without abnormality on                            direct and retroflexion views. Complications:            No immediate complications. Estimated blood loss:                            None. Estimated Blood Loss:     Estimated blood loss: none. Impression:               - One 3 mm polyp in the sigmoid colon, removed with                            a cold snare. Resected and retrieved.                           - Diverticulosis in the left colon.                           - External and internal hemorrhoids.                           - The examination was otherwise normal on direct                            and retroflexion views. Recommendation:           - Patient has a contact  number available for                            emergencies. The signs and symptoms of potential                            delayed complications were discussed with the                            patient. Return to normal activities tomorrow.                            Written discharge instructions were provided to the                            patient.                           - Resume previous diet.                           - Continue present medications.                           -  Await pathology results. Milus Banister, MD 11/20/2021 10:51:38 AM This report has been signed electronically.

## 2021-11-20 NOTE — Progress Notes (Signed)
Report to PACU, RN, vss, BBS= Clear.  

## 2021-11-23 ENCOUNTER — Telehealth: Payer: Self-pay | Admitting: *Deleted

## 2021-11-23 NOTE — Telephone Encounter (Signed)
  Follow up Call-     11/20/2021    9:57 AM  Call back number  Post procedure Call Back phone  # [phone]  Permission to leave phone message Yes     Patient questions:  Do you have a fever, pain , or abdominal swelling? No. Pain Score  0 *  Have you tolerated food without any problems? Yes.    Have you been able to return to your normal activities? Yes.    Do you have any questions about your discharge instructions: Diet   No. Medications  No. Follow up visit  No.  Do you have questions or concerns about your Care? No.  Actions: * If pain score is 4 or above: No action needed, pain <4.

## 2021-11-30 ENCOUNTER — Encounter: Payer: Self-pay | Admitting: Gastroenterology

## 2021-12-05 LAB — GLUCOSE, POCT (MANUAL RESULT ENTRY): POC Glucose: 169 mg/dl — AB (ref 70–99)

## 2021-12-23 ENCOUNTER — Encounter (HOSPITAL_BASED_OUTPATIENT_CLINIC_OR_DEPARTMENT_OTHER): Payer: Self-pay

## 2021-12-23 ENCOUNTER — Emergency Department (HOSPITAL_BASED_OUTPATIENT_CLINIC_OR_DEPARTMENT_OTHER): Payer: 59 | Admitting: Radiology

## 2021-12-23 ENCOUNTER — Emergency Department (HOSPITAL_BASED_OUTPATIENT_CLINIC_OR_DEPARTMENT_OTHER)
Admission: EM | Admit: 2021-12-23 | Discharge: 2021-12-23 | Disposition: A | Discharge status: Home / Self Care | Payer: 59 | Attending: Emergency Medicine | Admitting: Emergency Medicine

## 2021-12-23 DIAGNOSIS — M25512 Pain in left shoulder: Secondary | ICD-10-CM | POA: Insufficient documentation

## 2021-12-23 DIAGNOSIS — R079 Chest pain, unspecified: Secondary | ICD-10-CM | POA: Diagnosis not present

## 2021-12-23 LAB — BASIC METABOLIC PANEL
Anion gap: 10 (ref 5–15)
BUN: 15 mg/dL (ref 6–20)
CO2: 26 mmol/L (ref 22–32)
Calcium: 9.7 mg/dL (ref 8.9–10.3)
Chloride: 103 mmol/L (ref 98–111)
Creatinine, Ser: 1.18 mg/dL (ref 0.61–1.24)
GFR, Estimated: >60 mL/min (ref >60)
Glucose, Bld: 92 mg/dL (ref 70–99)
Potassium: 3.7 mmol/L (ref 3.5–5.1)
Sodium: 139 mmol/L (ref 135–145)

## 2021-12-23 LAB — CBC
HCT: 42.2 % (ref 39.0–52.0)
Hemoglobin: 14.7 g/dL (ref 13.0–17.0)
MCH: 29.9 pg (ref 26.0–34.0)
MCHC: 34.8 g/dL (ref 30.0–36.0)
MCV: 85.9 fL (ref 80.0–100.0)
Platelets: 282 10*3/uL (ref 150–400)
RBC: 4.91 MIL/uL (ref 4.22–5.81)
RDW: 12.8 % (ref 11.5–15.5)
WBC: 4.2 10*3/uL (ref 4.0–10.5)
nRBC: 0 % (ref 0.0–0.2)

## 2021-12-23 LAB — D-DIMER, QUANTITATIVE: D-Dimer, Quant: <0.27 ug/mL-FEU (ref 0.00–0.50)

## 2021-12-23 LAB — TROPONIN I (HIGH SENSITIVITY)
Troponin I (High Sensitivity): 3 ng/L (ref <18)
Troponin I (High Sensitivity): 3 ng/L (ref <18)

## 2021-12-23 NOTE — ED Triage Notes (Signed)
Pt presents POV from [facility] for Left side chest pain x2 days. RN at work evaluated him and sent him to the ED for further evaluation.

## 2021-12-23 NOTE — ED Provider Notes (Signed)
[facility] EMERGENCY DEPT Provider Note   CSN: [id number] Arrival date & time: 12/23/21  1803     History  Chief Complaint  Patient presents with   Chest Pain    Corey Oliver is a 56 y.o. male.   Chest Pain  Pt has been having some pain in his left chest/shoulder for a few days.  The pain has been ongoing for most of the day.  When the pain occurs it last for minutes at a time.  Nothing precipitates the pain in particular.  No cough.  No fevers.  Pt does have history of htn.  He has been taking his blood pressure medications.  No heart disease.  No lung problems.    Home Medications Prior to Admission medications   Medication Sig Start Date End Date Taking? Authorizing Provider  acetaminophen (TYLENOL) 325 MG tablet Take 2 tablets (650 mg total) by mouth every 6 (six) hours as needed. 12/18/18   Varney Biles, MD  Albuterol Sulfate (PROAIR RESPICLICK) 096 (90 Base) MCG/ACT AEPB Inhale 1-2 puffs into the lungs 4 (four) times daily as needed. 04/07/18   Marrian Salvage, FNP  B Complex-Folic Acid (B COMPLEX PLUS) TABS Take 1 tablet by mouth daily. 04/30/21   Plotnikov, Evie Lacks, MD  buPROPion (WELLBUTRIN XL) 150 MG 24 hr tablet TAKE 1 TABLET BY MOUTH EVERY DAY 10/27/21   Plotnikov, Evie Lacks, MD  Cholecalciferol (VITAMIN D3) 50 MCG (2000 UT) capsule Take 1 capsule (2,000 Units total) by mouth daily. 04/30/21   Plotnikov, Evie Lacks, MD  clobetasol ointment (TEMOVATE) 0.05 % Apply topically 3 (three) times a week. 10/08/21   [provider]  Clobetasol Propionate 0.05 % shampoo Apply topically. 10/08/21   [provider]  DEXILANT 60 MG capsule TAKE 1 CAPSULE BY MOUTH AS NEEDED Strength: 60 mg 11/20/21   Plotnikov, Evie Lacks, MD  finasteride (PROSCAR) 5 MG tablet Take 0.5 tablets by mouth daily. Patient not taking: Reported on 11/20/2021    [provider]  Fluocin-Hydroquinone-Tretinoin (TRI-LUMA) 0.01-4-0.05 % CREA As directed Patient not  taking: Reported on 11/20/2021 04/27/17   Plotnikov, Evie Lacks, MD  fluticasone (FLONASE) 50 MCG/ACT nasal spray Place 2 sprays into the nose daily. Patient not taking: Reported on 11/20/2021 03/15/13   Neena Rhymes, MD  hydrocortisone-pramoxine [facility]) 2.5-1 % rectal cream Place 1 application rectally 3 (three) times daily. Patient not taking: Reported on 11/20/2021 05/14/20   Plotnikov, Evie Lacks, MD  hydroquinone 4 % cream Apply topically at bedtime. Patient not taking: Reported on 11/20/2021 10/08/21   [provider]  ibuprofen (ADVIL) 800 MG tablet TAKE 1 TABLET BY MOUTH EVERY 8 HOURS AS NEEDED Patient not taking: Reported on 11/20/2021 08/27/21   Plotnikov, Evie Lacks, MD  ipratropium (ATROVENT) 0.03 % nasal spray Place 2 sprays into both nostrils 2 (two) times daily. Do not use for more than 5days. Patient not taking: Reported on 11/20/2021 01/28/17   Nche, Charlene Brooke, NP  ondansetron (ZOFRAN ODT) 8 MG disintegrating tablet Take 1 tablet (8 mg total) by mouth every 8 (eight) hours as needed for nausea or vomiting. 08/12/20   Marrian Salvage, FNP  ondansetron (ZOFRAN) 4 MG tablet Take 1 tablet (4 mg total) by mouth as directed. Take one Zofran 4 mg tablet 30-60 minutes before each prep dose 10/26/21   Milus Banister, MD  SUMAtriptan (IMITREX) 100 MG tablet Take 1 tablet (100 mg total) by mouth once. May repeat in 2 hours if headache  persists or recurs. Patient not taking: Reported on 11/20/2021 04/21/15   Plotnikov, Evie Lacks, MD  tadalafil (CIALIS) 20 MG tablet Take 1 tablet (20 mg total) by mouth daily as needed for erectile dysfunction. Patient not taking: Reported on 11/20/2021 09/18/20 10/26/21  Plotnikov, Evie Lacks, MD  triamcinolone ointment (KENALOG) 0.1 % Apply 1 application topically 2 (two) times daily. Patient not taking: Reported on 11/20/2021 03/19/20   Plotnikov, Evie Lacks, MD  TRIBENZOR 40-10-25 MG TABS TAKE 1 TABLET BY MOUTH EVERY DAY 03/30/21   Plotnikov, Evie Lacks, MD      Allergies    Patient has no known allergies.    Review of Systems   Review of Systems  Cardiovascular:  Positive for chest pain.    Physical Exam Updated Vital Signs BP (!) 146/92   Pulse 93   Temp 97.9 F (36.6 C) (Oral)   Resp 17   SpO2 98%  Physical Exam Vitals and nursing note reviewed.  Constitutional:      General: He is not in acute distress.    Appearance: He is well-developed.  HENT:     Head: Normocephalic and atraumatic.     Right Ear: External ear normal.     Left Ear: External ear normal.  Eyes:     General: No scleral icterus.       Right eye: No discharge.        Left eye: No discharge.     Conjunctiva/sclera: Conjunctivae normal.  Neck:     Trachea: No tracheal deviation.  Cardiovascular:     Rate and Rhythm: Normal rate and regular rhythm.  Pulmonary:     Effort: Pulmonary effort is normal. No respiratory distress.     Breath sounds: Normal breath sounds. No stridor. No wheezing or rales.  Abdominal:     General: Bowel sounds are normal. There is no distension.     Palpations: Abdomen is soft.     Tenderness: There is no abdominal tenderness. There is no guarding or rebound.  Musculoskeletal:        General: No tenderness or deformity.     Cervical back: Neck supple.  Skin:    General: Skin is warm and dry.     Findings: No rash.  Neurological:     General: No focal deficit present.     Mental Status: He is alert.     Cranial Nerves: No cranial nerve deficit (no facial droop, extraocular movements intact, no slurred speech).     Sensory: No sensory deficit.     Motor: No abnormal muscle tone or seizure activity.     Coordination: Coordination normal.  Psychiatric:        Mood and Affect: Mood normal.     ED Results / Procedures / Treatments   Labs (all labs ordered are listed, but only abnormal results are displayed) Labs Reviewed  BASIC METABOLIC PANEL  CBC  D-DIMER, QUANTITATIVE  TROPONIN I (HIGH SENSITIVITY)   TROPONIN I (HIGH SENSITIVITY)    EKG EKG Interpretation  Date/Time:  Wednesday December 23 2021 18:09:17 EDT Ventricular Rate:  100 PR Interval:  176 QRS Duration: 76 QT Interval:  340 QTC Calculation: 438 R Axis:   41 Text Interpretation: Normal sinus rhythm When compared with ECG of 27-Oct-2008 17:03, No significant change was found Confirmed by Dorie Rank [phone]) on 12/23/2021 6:53:04 PM  Radiology DG Chest 2 View  Result Date: 12/23/2021 CLINICAL DATA:  Chest pain EXAM: CHEST - 2 VIEW COMPARISON:  11/07/2015 FINDINGS:  The heart size and mediastinal contours are within normal limits. Both lungs are clear. The visualized skeletal structures are unremarkable. IMPRESSION: No acute abnormality of the lungs. Electronically Signed   By: Delanna Ahmadi M.D.   On: 12/23/2021 18:43    Procedures Procedures    Medications Ordered in ED Medications - No data to display  ED Course/ Medical Decision Making/ A&P Clinical Course as of 12/23/21 2116  Wed Dec 23, 2021  2055 Troponin I (High Sensitivity) [JK]    Clinical Course User Index [JK] Dorie Rank, MD                           Medical Decision Making Amount and/or Complexity of Data Reviewed Labs: ordered. Decision-making details documented in ED Course. Radiology: ordered.   Patient presented to the ER for evaluation of chest pain.  Was primarily having pain in his shoulder and really in his chest.  Pain not typical for ACS.  No exercise-induced component.  ED work-up is reassuring.  Serial troponins are normal.  CBC metabolic panel normal.  D-dimer is negative.  I doubt acute coronary syndrome or pulmonary embolism.  No evidence of pneumonia.  Overall low risk heart score.  Evaluation and diagnostic testing in the emergency department does not suggest an emergent condition requiring admission or immediate intervention beyond what has been performed at this time.  The patient is safe for discharge and has been instructed to return  immediately for worsening symptoms, change in symptoms or any other concerns.         Final Clinical Impression(s) / ED Diagnoses Final diagnoses:  Chest pain, unspecified type    Rx / DC Orders ED Discharge Orders     None         Dorie Rank, MD 12/23/21 2117

## 2021-12-23 NOTE — ED Notes (Signed)
Late entry -- Pt lying in bed awake and alert; no acute distress observed.  GCS 15.  RR even and unlabored on RA with symmetrical rise and fall of chest.  Pt denies cp - reports only non-radiating L shoulder pain; denied any recent injury/trauma initially then re-calls heavy lifting at work recently; states employment does not typically require frequent heavy lifting.  Pt denies LUE parasthesias, denies n/v, denies HA/weakness/vision changes/sob.  Continuous cardiac and pulse ox maintained as pt awaits results on repeat trop.  Denies any additional questions, concerns, needs at this time.  Will monitor for acute changes and maintain plan of care.

## 2021-12-23 NOTE — ED Notes (Signed)
ED Provider at bedside. 

## 2021-12-23 NOTE — Discharge Instructions (Signed)
Continue your current medications.  Follow-up with your primary care doctor as we discussed.  Return as needed for worsening symptoms

## 2021-12-23 NOTE — ED Notes (Signed)
Pt to the nurses station; states he will just review d/c instructions on MyChart -- does not want to wait for written d/c instructions -- provider had been to the bedside to discuss results and plan for discharge dispo -- paperwork not ready yet -- pt ambulatory with steady gait; vitals stable; no distress.

## 2022-02-08 ENCOUNTER — Telehealth: Payer: Self-pay

## 2022-02-08 DIAGNOSIS — R7989 Other specified abnormal findings of blood chemistry: Secondary | ICD-10-CM

## 2022-02-08 DIAGNOSIS — Z0000 Encounter for general adult medical examination without abnormal findings: Secondary | ICD-10-CM

## 2022-02-08 NOTE — Telephone Encounter (Signed)
Pt is requesting labs to be ordered/completed prior to his CPE on 02/11/22.  Please advise

## 2022-02-09 NOTE — Telephone Encounter (Signed)
Okay.  Thanks.

## 2022-02-09 NOTE — Telephone Encounter (Signed)
Notified pt MD ok labs. Will come tomorrow. Add to lab schedule.Marland KitchenJohny Oliver

## 2022-02-10 ENCOUNTER — Encounter: Payer: Self-pay | Admitting: Internal Medicine

## 2022-02-10 ENCOUNTER — Other Ambulatory Visit (INDEPENDENT_AMBULATORY_CARE_PROVIDER_SITE_OTHER): Payer: 59

## 2022-02-10 DIAGNOSIS — Z0000 Encounter for general adult medical examination without abnormal findings: Secondary | ICD-10-CM | POA: Diagnosis not present

## 2022-02-10 DIAGNOSIS — R7989 Other specified abnormal findings of blood chemistry: Secondary | ICD-10-CM

## 2022-02-10 LAB — CBC WITH DIFFERENTIAL/PLATELET
Basophils Absolute: 0 10*3/uL (ref 0.0–0.1)
Basophils Relative: 0.7 % (ref 0.0–3.0)
Eosinophils Absolute: 0.1 10*3/uL (ref 0.0–0.7)
Eosinophils Relative: 3 % (ref 0.0–5.0)
HCT: 42.8 % (ref 39.0–52.0)
Hemoglobin: 14.2 g/dL (ref 13.0–17.0)
Lymphocytes Relative: 42.4 % (ref 12.0–46.0)
Lymphs Abs: 1.8 10*3/uL (ref 0.7–4.0)
MCHC: 33.2 g/dL (ref 30.0–36.0)
MCV: 88.8 fl (ref 78.0–100.0)
Monocytes Absolute: 0.3 10*3/uL (ref 0.1–1.0)
Monocytes Relative: 7.8 % (ref 3.0–12.0)
Neutro Abs: 1.9 10*3/uL (ref 1.4–7.7)
Neutrophils Relative %: 46.1 % (ref 43.0–77.0)
Platelets: 278 10*3/uL (ref 150.0–400.0)
RBC: 4.82 Mil/uL (ref 4.22–5.81)
RDW: 13.4 % (ref 11.5–15.5)
WBC: 4.1 10*3/uL (ref 4.0–10.5)

## 2022-02-10 LAB — COMPREHENSIVE METABOLIC PANEL
ALT: 12 U/L (ref 0–53)
AST: 14 U/L (ref 0–37)
Albumin: 3.9 g/dL (ref 3.5–5.2)
Alkaline Phosphatase: 60 U/L (ref 39–117)
BUN: 14 mg/dL (ref 6–23)
CO2: 26 mEq/L (ref 19–32)
Calcium: 9.2 mg/dL (ref 8.4–10.5)
Chloride: 102 mEq/L (ref 96–112)
Creatinine, Ser: 1.2 mg/dL (ref 0.40–1.50)
GFR: 67.62 mL/min (ref >60.00)
Glucose, Bld: 136 mg/dL — ABNORMAL HIGH (ref 70–99)
Potassium: 3.7 mEq/L (ref 3.5–5.1)
Sodium: 140 mEq/L (ref 135–145)
Total Bilirubin: 0.7 mg/dL (ref 0.2–1.2)
Total Protein: 6.8 g/dL (ref 6.0–8.3)

## 2022-02-10 LAB — PSA: PSA: 4.58 ng/mL — ABNORMAL HIGH (ref 0.10–4.00)

## 2022-02-10 LAB — LIPID PANEL
Cholesterol: 147 mg/dL (ref 0–200)
HDL: 42.7 mg/dL (ref >39.00)
LDL Cholesterol: 93 mg/dL (ref 0–99)
NonHDL: 104.22
Total CHOL/HDL Ratio: 3
Triglycerides: 56 mg/dL (ref 0.0–149.0)
VLDL: 11.2 mg/dL (ref 0.0–40.0)

## 2022-02-10 LAB — URINALYSIS
Bilirubin Urine: NEGATIVE
Hgb urine dipstick: NEGATIVE
Ketones, ur: NEGATIVE
Leukocytes,Ua: NEGATIVE
Nitrite: NEGATIVE
Specific Gravity, Urine: 1.025 (ref 1.000–1.030)
Total Protein, Urine: NEGATIVE
Urine Glucose: NEGATIVE
Urobilinogen, UA: 1 (ref 0.0–1.0)
pH: 6 (ref 5.0–8.0)

## 2022-02-10 LAB — TSH: TSH: 1.82 u[IU]/mL (ref 0.35–5.50)

## 2022-02-10 LAB — T4, FREE: Free T4: 0.89 ng/dL (ref 0.60–1.60)

## 2022-02-11 ENCOUNTER — Ambulatory Visit (INDEPENDENT_AMBULATORY_CARE_PROVIDER_SITE_OTHER): Payer: 59 | Admitting: Internal Medicine

## 2022-02-11 ENCOUNTER — Encounter: Payer: Self-pay | Admitting: Internal Medicine

## 2022-02-11 VITALS — BP 130/88 | HR 99 | Temp 97.9°F | Ht 67.0 in | Wt 221.4 lb

## 2022-02-11 DIAGNOSIS — N529 Male erectile dysfunction, unspecified: Secondary | ICD-10-CM | POA: Diagnosis not present

## 2022-02-11 DIAGNOSIS — E559 Vitamin D deficiency, unspecified: Secondary | ICD-10-CM

## 2022-02-11 DIAGNOSIS — Z0000 Encounter for general adult medical examination without abnormal findings: Secondary | ICD-10-CM | POA: Diagnosis not present

## 2022-02-11 DIAGNOSIS — R739 Hyperglycemia, unspecified: Secondary | ICD-10-CM

## 2022-02-11 DIAGNOSIS — N138 Other obstructive and reflux uropathy: Secondary | ICD-10-CM

## 2022-02-11 DIAGNOSIS — N401 Enlarged prostate with lower urinary tract symptoms: Secondary | ICD-10-CM

## 2022-02-11 DIAGNOSIS — E291 Testicular hypofunction: Secondary | ICD-10-CM

## 2022-02-11 DIAGNOSIS — E669 Obesity, unspecified: Secondary | ICD-10-CM | POA: Insufficient documentation

## 2022-02-11 DIAGNOSIS — E538 Deficiency of other specified B group vitamins: Secondary | ICD-10-CM

## 2022-02-11 DIAGNOSIS — R972 Elevated prostate specific antigen [PSA]: Secondary | ICD-10-CM

## 2022-02-11 DIAGNOSIS — F329 Major depressive disorder, single episode, unspecified: Secondary | ICD-10-CM

## 2022-02-11 MED ORDER — WEGOVY 0.25 MG/0.5ML ~~LOC~~ SOAJ: 0.2500 mg | SUBCUTANEOUS | 2 refills | Status: DC

## 2022-02-11 MED ORDER — TADALAFIL 5 MG PO TABS: 5.0000 mg | ORAL_TABLET | Freq: Every day | ORAL | 3 refills | Status: AC

## 2022-02-11 NOTE — Progress Notes (Signed)
Subjective:  Patient ID: Corey Oliver, male    DOB: 04/04/1966  Age: 56 y.o. MRN: [id number]  CC: Annual Exam   HPI DEKARI BURES presents for a well exam C/o obesity, stress  Outpatient Medications Prior to Visit  Medication Sig Dispense Refill   acetaminophen (TYLENOL) 325 MG tablet Take 2 tablets (650 mg total) by mouth every 6 (six) hours as needed. 30 tablet 0   Albuterol Sulfate (PROAIR RESPICLICK) 035 (90 Base) MCG/ACT AEPB Inhale 1-2 puffs into the lungs 4 (four) times daily as needed. 1 each 2   B Complex-Folic Acid (B COMPLEX PLUS) TABS Take 1 tablet by mouth daily. 100 tablet 3   buPROPion (WELLBUTRIN XL) 150 MG 24 hr tablet TAKE 1 TABLET BY MOUTH EVERY DAY 30 tablet 5   Cholecalciferol (VITAMIN D3) 50 MCG (2000 UT) capsule Take 1 capsule (2,000 Units total) by mouth daily. 100 capsule 3   clobetasol ointment (TEMOVATE) 0.05 % Apply topically 3 (three) times a week.     Clobetasol Propionate 0.05 % shampoo Apply topically.     DEXILANT 60 MG capsule TAKE 1 CAPSULE BY MOUTH AS NEEDED Strength: 60 mg 30 capsule 2   finasteride (PROSCAR) 5 MG tablet Take 0.5 tablets by mouth daily.     Fluocin-Hydroquinone-Tretinoin (TRI-LUMA) 0.01-4-0.05 % CREA As directed 30 g 3   fluticasone (FLONASE) 50 MCG/ACT nasal spray Place 2 sprays into the nose daily. 16 g 10   hydrocortisone-pramoxine (ANALPRAM-HC) 2.5-1 % rectal cream Place 1 application rectally 3 (three) times daily. 30 g 0   hydroquinone 4 % cream Apply topically at bedtime.     ibuprofen (ADVIL) 800 MG tablet TAKE 1 TABLET BY MOUTH EVERY 8 HOURS AS NEEDED 100 tablet 0   ipratropium (ATROVENT) 0.03 % nasal spray Place 2 sprays into both nostrils 2 (two) times daily. Do not use for more than 5days. 30 mL 0   ondansetron (ZOFRAN) 4 MG tablet Take 1 tablet (4 mg total) by mouth as directed. Take one Zofran 4 mg tablet 30-60 minutes before each prep dose 2 tablet 0   SUMAtriptan (IMITREX) 100 MG tablet Take 1 tablet (100 mg total)  by mouth once. May repeat in 2 hours if headache persists or recurs. 12 tablet 3   triamcinolone ointment (KENALOG) 0.1 % Apply 1 application topically 2 (two) times daily. 80 g 3   TRIBENZOR 40-10-25 MG TABS TAKE 1 TABLET BY MOUTH EVERY DAY 90 tablet 1   ondansetron (ZOFRAN ODT) 8 MG disintegrating tablet Take 1 tablet (8 mg total) by mouth every 8 (eight) hours as needed for nausea or vomiting. (Patient not taking: Reported on 02/11/2022) 20 tablet 0   tadalafil (CIALIS) 20 MG tablet Take 1 tablet (20 mg total) by mouth daily as needed for erectile dysfunction. (Patient not taking: Reported on 11/20/2021) 12 tablet 5   No facility-administered medications prior to visit.    ROS: Review of Systems  Constitutional:  Negative for appetite change, fatigue and unexpected weight change.  HENT:  Negative for congestion, nosebleeds, sneezing, sore throat and trouble swallowing.   Eyes:  Negative for itching and visual disturbance.  Respiratory:  Negative for cough.   Cardiovascular:  Negative for chest pain, palpitations and leg swelling.  Gastrointestinal:  Negative for abdominal distention, blood in stool, diarrhea and nausea.  Genitourinary:  Negative for frequency and hematuria.  Musculoskeletal:  Negative for back pain, gait problem, joint swelling and neck pain.  Skin:  Negative for rash.  Neurological:  Negative for dizziness, tremors, speech difficulty and weakness.  Psychiatric/Behavioral:  Positive for dysphoric mood. Negative for agitation, sleep disturbance and suicidal ideas. The patient is nervous/anxious.     Objective:  BP 130/88 (BP Location: Left Arm)   Pulse 99   Temp 97.9 F (36.6 C) (Oral)   Ht '5\' 7"'$  (1.702 m)   Wt 221 lb 6.4 oz (100.4 kg)   SpO2 95%   BMI 34.68 kg/m   BP Readings from Last 3 Encounters:  02/11/22 130/88  12/23/21 (!) 146/92  12/05/21 (!) 150/94    Wt Readings from Last 3 Encounters:  02/11/22 221 lb 6.4 oz (100.4 kg)  11/20/21 224 lb (101.6  kg)  10/26/21 224 lb (101.6 kg)    Physical Exam Constitutional:      General: He is not in acute distress.    Appearance: He is well-developed.     Comments: NAD  Eyes:     Conjunctiva/sclera: Conjunctivae normal.     Pupils: Pupils are equal, round, and reactive to light.  Neck:     Thyroid: No thyromegaly.     Vascular: No JVD.  Cardiovascular:     Rate and Rhythm: Normal rate and regular rhythm.     Heart sounds: Normal heart sounds. No murmur heard.    No friction rub. No gallop.  Pulmonary:     Effort: Pulmonary effort is normal. No respiratory distress.     Breath sounds: Normal breath sounds. No wheezing or rales.  Chest:     Chest wall: No tenderness.  Abdominal:     General: Bowel sounds are normal. There is no distension.     Palpations: Abdomen is soft. There is no mass.     Tenderness: There is no abdominal tenderness. There is no guarding or rebound.  Musculoskeletal:        General: No tenderness. Normal range of motion.     Cervical back: Normal range of motion.  Lymphadenopathy:     Cervical: No cervical adenopathy.  Skin:    General: Skin is warm and dry.     Findings: No rash.  Neurological:     Mental Status: He is alert and oriented to person, place, and time.     Cranial Nerves: No cranial nerve deficit.     Motor: No abnormal muscle tone.     Coordination: Coordination normal.     Gait: Gait normal.     Deep Tendon Reflexes: Reflexes are normal and symmetric.  Psychiatric:        Behavior: Behavior normal.        Thought Content: Thought content normal.        Judgment: Judgment normal.   Rectal - per GI I spent 22 minutes in addition to time for CPX wellness examination in preparing to see the patient by review of recent labs, imaging and procedures, obtaining and reviewing separately obtained history, communicating with the patient, ordering medications, tests or procedures, and documenting clinical information in the EHR including the  differential diagnosis, treatment, and any further evaluation and other management of elev PSA, elev glucose, obesity         Lab Results  Component Value Date   WBC 4.1 02/10/2022   HGB 14.2 02/10/2022   HCT 42.8 02/10/2022   PLT 278.0 02/10/2022   GLUCOSE 136 (H) 02/10/2022   CHOL 147 02/10/2022   TRIG 56.0 02/10/2022   HDL 42.70 02/10/2022   LDLCALC 93 02/10/2022   ALT 12 02/10/2022  AST 14 02/10/2022   NA 140 02/10/2022   K 3.7 02/10/2022   CL 102 02/10/2022   CREATININE 1.20 02/10/2022   BUN 14 02/10/2022   CO2 26 02/10/2022   TSH 1.82 02/10/2022   PSA 4.58 (H) 02/10/2022    DG Chest 2 View  Result Date: 12/23/2021 CLINICAL DATA:  Chest pain EXAM: CHEST - 2 VIEW COMPARISON:  11/07/2015 FINDINGS: The heart size and mediastinal contours are within normal limits. Both lungs are clear. The visualized skeletal structures are unremarkable. IMPRESSION: No acute abnormality of the lungs. Electronically Signed   By: Delanna Ahmadi M.D.   On: 12/23/2021 18:43    Assessment & Plan:   Problem List Items Addressed This Visit     B12 deficiency    On B complex      BPH with obstruction/lower urinary tract symptoms    F/u w/Urology - Dr Amalia Hailey      Relevant Orders   PSA   Elevated prostate specific antigen (PSA)    Lab Results  Component Value Date   PSA 4.58 (H) 02/10/2022   PSA 3.85 09/17/2019   PSA 2.30 04/28/2018  Pt will see Dr Amalia Hailey       Erectile dysfunction    F/u w/Urology - Dr Amalia Hailey      Hypogonadism in male    F/u w/Urology - Dr Amalia Hailey      Obesity (BMI 30-39.9)    BMI 34 Wegovy Rx      Relevant Medications   Semaglutide-Weight Management (WEGOVY) 0.25 MG/0.5ML SOAJ   Other Relevant Orders   Comprehensive metabolic panel   Reactive depression (situational)     Chronic stress/depression f/u. Cont w/Wellbutrin      Vitamin D deficiency     Vit D prescription 50000 iu weekly       Well adult exam    We discussed age appropriate health  related issues, including available/recomended screening tests and vaccinations. We discussed a need for adhering to healthy diet and exercise. Labs  were reviewed/ordered. All questions were answered. BMI 34 Pt had a colonsocopy Feb '13 - normal study; 2023 Dr Ardis Hughs - polyp, due in 2030. Immunization: decline flu shot ; reports he has had tetanus in the last 10 years.      Other Visit Diagnoses     Hyperglycemia    -  Primary   Relevant Orders   Hemoglobin A1c         Meds ordered this encounter  Medications   tadalafil (CIALIS) 5 MG tablet    Sig: Take 1 tablet (5 mg total) by mouth daily.    Dispense:  90 tablet    Refill:  3   Semaglutide-Weight Management (WEGOVY) 0.25 MG/0.5ML SOAJ    Sig: Inject 0.25 mg into the skin once a week.    Dispense:  2 mL    Refill:  2      Follow-up: Return in about 4 months (around 06/13/2022) for a follow-up visit.  Walker Kehr, MD

## 2022-02-11 NOTE — Assessment & Plan Note (Signed)
On B complex 

## 2022-02-11 NOTE — Assessment & Plan Note (Signed)
F/u w/Urology - Dr Amalia Hailey

## 2022-02-11 NOTE — Assessment & Plan Note (Signed)
Vit D prescription 50000 iu weekly

## 2022-02-11 NOTE — Assessment & Plan Note (Signed)
Chronic stress/depression f/u. Cont w/Wellbutrin

## 2022-02-11 NOTE — Assessment & Plan Note (Signed)
We discussed age appropriate health related issues, including available/recomended screening tests and vaccinations. We discussed a need for adhering to healthy diet and exercise. Labs  were reviewed/ordered. All questions were answered. BMI 34 Pt had a colonsocopy Feb '13 - normal study; 2023 Dr Ardis Hughs - polyp, due in 2030. Immunization: decline flu shot ; reports he has had tetanus in the last 10 years.

## 2022-02-11 NOTE — Assessment & Plan Note (Signed)
Lab Results  Component Value Date   PSA 4.58 (H) 02/10/2022   PSA 3.85 09/17/2019   PSA 2.30 04/28/2018   Pt will see Dr Amalia Hailey

## 2022-02-11 NOTE — Assessment & Plan Note (Signed)
BMI 34 Wegovy Rx

## 2022-02-16 ENCOUNTER — Other Ambulatory Visit: Payer: Self-pay | Admitting: Internal Medicine

## 2022-02-17 ENCOUNTER — Telehealth: Payer: Self-pay | Admitting: Internal Medicine

## 2022-02-17 MED ORDER — WEGOVY 0.25 MG/0.5ML ~~LOC~~ SOAJ: 0.2500 mg | SUBCUTANEOUS | 0 refills | Status: DC

## 2022-02-17 MED ORDER — BUPROPION HCL ER (XL) 150 MG PO TB24: 150.0000 mg | ORAL_TABLET | Freq: Every day | ORAL | 3 refills | Status: DC

## 2022-02-17 NOTE — Telephone Encounter (Signed)
Patient is requesting 3 month supplies for the following medications - he states his insurance will cover if they are sent in for three months:   buPROPion (WELLBUTRIN XL) 150 MG 24 hr tablet  Semaglutide-Weight Management (WEGOVY) 0.25 MG/0.5ML SOAJ  [id number]- [place], [place] - [address]

## 2022-02-17 NOTE — Telephone Encounter (Signed)
Resent rx for 90day to [facility],,,/lmb

## 2022-02-19 ENCOUNTER — Encounter: Payer: Self-pay | Admitting: Internal Medicine

## 2022-02-19 ENCOUNTER — Telehealth: Payer: Self-pay | Admitting: Internal Medicine

## 2022-02-19 NOTE — Telephone Encounter (Signed)
Pt has dropped off PW to be filled out by the provider and they have been placed in Dr. Judeen Hammans box.

## 2022-02-22 NOTE — Telephone Encounter (Signed)
Place FMLA in purple folder for completion.Marland KitchenJohny Oliver

## 2022-02-24 NOTE — Telephone Encounter (Signed)
MD signed notified pt forms ready for pick-up.Marland KitchenJohny Chess

## 2022-06-01 ENCOUNTER — Ambulatory Visit: Payer: 59 | Admitting: Internal Medicine

## 2022-06-01 ENCOUNTER — Encounter: Payer: Self-pay | Admitting: Internal Medicine

## 2022-06-01 VITALS — BP 128/80 | HR 99 | Temp 97.7°F | Ht 67.0 in | Wt 219.0 lb

## 2022-06-01 DIAGNOSIS — R519 Headache, unspecified: Secondary | ICD-10-CM

## 2022-06-01 DIAGNOSIS — R739 Hyperglycemia, unspecified: Secondary | ICD-10-CM

## 2022-06-01 DIAGNOSIS — E538 Deficiency of other specified B group vitamins: Secondary | ICD-10-CM

## 2022-06-01 DIAGNOSIS — E669 Obesity, unspecified: Secondary | ICD-10-CM | POA: Diagnosis not present

## 2022-06-01 DIAGNOSIS — E559 Vitamin D deficiency, unspecified: Secondary | ICD-10-CM

## 2022-06-01 DIAGNOSIS — R972 Elevated prostate specific antigen [PSA]: Secondary | ICD-10-CM

## 2022-06-01 DIAGNOSIS — K219 Gastro-esophageal reflux disease without esophagitis: Secondary | ICD-10-CM | POA: Diagnosis not present

## 2022-06-01 DIAGNOSIS — I10 Essential (primary) hypertension: Secondary | ICD-10-CM

## 2022-06-01 MED ORDER — SUMATRIPTAN SUCCINATE 100 MG PO TABS: 100.0000 mg | ORAL_TABLET | Freq: Once | ORAL | 3 refills | Status: DC

## 2022-06-01 MED ORDER — DEXILANT 60 MG PO CPDR: DELAYED_RELEASE_CAPSULE | ORAL | 3 refills | Status: DC

## 2022-06-01 MED ORDER — IBUPROFEN 600 MG PO TABS: ORAL_TABLET | ORAL | 0 refills | Status: DC

## 2022-06-01 MED ORDER — TRIBENZOR 40-10-25 MG PO TABS
1.0000 | ORAL_TABLET | Freq: Every day | ORAL | 3 refills | Status: DC
Start: 2022-06-01 — End: 2023-10-25

## 2022-06-01 NOTE — Assessment & Plan Note (Signed)
Imitrex prn Ibuprofen 600 mg tid pc prn w/caution

## 2022-06-01 NOTE — Assessment & Plan Note (Signed)
Dexilant qd

## 2022-06-01 NOTE — Assessment & Plan Note (Signed)
On Vit D 

## 2022-06-01 NOTE — Progress Notes (Signed)
Subjective:  Patient ID: Corey Oliver, male    DOB: 02-15-1966  Age: 57 y.o. MRN: [id number]  CC: Headache (Has been off and on all week , wants dexilant and tribenzor refills )   HPI Corey Oliver presents for HA x 2 days. Pt had URI x >1 week F/u on HTN, GERD, LBP  Outpatient Medications Prior to Visit  Medication Sig Dispense Refill   acetaminophen (TYLENOL) 325 MG tablet Take 2 tablets (650 mg total) by mouth every 6 (six) hours as needed. 30 tablet 0   Albuterol Sulfate (PROAIR RESPICLICK) 175 (90 Base) MCG/ACT AEPB Inhale 1-2 puffs into the lungs 4 (four) times daily as needed. 1 each 2   B Complex-Folic Acid (B COMPLEX PLUS) TABS Take 1 tablet by mouth daily. 100 tablet 3   buPROPion (WELLBUTRIN XL) 150 MG 24 hr tablet Take 1 tablet (150 mg total) by mouth daily. 90 tablet 3   Cholecalciferol (VITAMIN D3) 50 MCG (2000 UT) capsule Take 1 capsule (2,000 Units total) by mouth daily. 100 capsule 3   clobetasol ointment (TEMOVATE) 0.05 % Apply topically 3 (three) times a week.     Clobetasol Propionate 0.05 % shampoo Apply topically.     finasteride (PROSCAR) 5 MG tablet Take 0.5 tablets by mouth daily.     Fluocin-Hydroquinone-Tretinoin (TRI-LUMA) 0.01-4-0.05 % CREA As directed 30 g 3   fluticasone (FLONASE) 50 MCG/ACT nasal spray Place 2 sprays into the nose daily. 16 g 10   hydrocortisone-pramoxine (ANALPRAM-HC) 2.5-1 % rectal cream Place 1 application rectally 3 (three) times daily. 30 g 0   hydroquinone 4 % cream Apply topically at bedtime.     ipratropium (ATROVENT) 0.03 % nasal spray Place 2 sprays into both nostrils 2 (two) times daily. Do not use for more than 5days. 30 mL 0   ondansetron (ZOFRAN) 4 MG tablet Take 1 tablet (4 mg total) by mouth as directed. Take one Zofran 4 mg tablet 30-60 minutes before each prep dose 2 tablet 0   Semaglutide-Weight Management (WEGOVY) 0.25 MG/0.5ML SOAJ Inject 0.25 mg into the skin once a week. 6 mL 0   tadalafil (CIALIS) 5 MG tablet Take  1 tablet (5 mg total) by mouth daily. 90 tablet 3   triamcinolone ointment (KENALOG) 0.1 % Apply 1 application topically 2 (two) times daily. 80 g 3   benzonatate (TESSALON) 100 MG capsule Take 1 capsule by mouth 3 (three) times daily as needed.     DEXILANT 60 MG capsule TAKE 1 CAPSULE BY MOUTH AS NEEDED STRENGTH 90 capsule 3   ibuprofen (ADVIL) 800 MG tablet TAKE 1 TABLET BY MOUTH EVERY 8 HOURS AS NEEDED 100 tablet 0   SUMAtriptan (IMITREX) 100 MG tablet Take 1 tablet (100 mg total) by mouth once. May repeat in 2 hours if headache persists or recurs. 12 tablet 3   TRIBENZOR 40-10-25 MG TABS TAKE 1 TABLET BY MOUTH EVERY DAY 90 tablet 1   No facility-administered medications prior to visit.    ROS: Review of Systems  Constitutional:  Negative for appetite change, fatigue and unexpected weight change.  HENT:  Positive for congestion. Negative for nosebleeds, sneezing, sore throat and trouble swallowing.   Eyes:  Negative for itching and visual disturbance.  Respiratory:  Negative for cough.   Cardiovascular:  Negative for chest pain, palpitations and leg swelling.  Gastrointestinal:  Negative for abdominal distention, blood in stool, diarrhea and nausea.  Genitourinary:  Negative for frequency and hematuria.  Musculoskeletal:  Negative for back pain, gait problem, joint swelling and neck pain.  Skin:  Negative for rash.  Neurological:  Positive for headaches. Negative for dizziness, tremors, speech difficulty and weakness.  Psychiatric/Behavioral:  Negative for agitation, dysphoric mood and sleep disturbance. The patient is not nervous/anxious.     Objective:  BP 128/80 (BP Location: Right Arm, Patient Position: Sitting, Cuff Size: Normal)   Pulse 99   Temp 97.7 F (36.5 C) (Oral)   Ht '5\' 7"'$  (1.702 m)   Wt 219 lb (99.3 kg)   SpO2 97%   BMI 34.30 kg/m   BP Readings from Last 3 Encounters:  06/01/22 128/80  02/11/22 130/88  12/23/21 (!) 146/92    Wt Readings from Last 3  Encounters:  06/01/22 219 lb (99.3 kg)  02/11/22 221 lb 6.4 oz (100.4 kg)  11/20/21 224 lb (101.6 kg)    Physical Exam Constitutional:      General: He is not in acute distress.    Appearance: He is well-developed. He is obese.     Comments: NAD  Eyes:     Conjunctiva/sclera: Conjunctivae normal.     Pupils: Pupils are equal, round, and reactive to light.  Neck:     Thyroid: No thyromegaly.     Vascular: No JVD.  Cardiovascular:     Rate and Rhythm: Normal rate and regular rhythm.     Heart sounds: Normal heart sounds. No murmur heard.    No friction rub. No gallop.  Pulmonary:     Effort: Pulmonary effort is normal. No respiratory distress.     Breath sounds: Normal breath sounds. No wheezing or rales.  Chest:     Chest wall: No tenderness.  Abdominal:     General: Bowel sounds are normal. There is no distension.     Palpations: Abdomen is soft. There is no mass.     Tenderness: There is no abdominal tenderness. There is no guarding or rebound.  Musculoskeletal:        General: No tenderness. Normal range of motion.     Cervical back: Normal range of motion.  Lymphadenopathy:     Cervical: No cervical adenopathy.  Skin:    General: Skin is warm and dry.     Findings: No rash.  Neurological:     Mental Status: He is alert and oriented to person, place, and time.     Cranial Nerves: No cranial nerve deficit.     Motor: No abnormal muscle tone.     Coordination: Coordination normal.     Gait: Gait normal.     Deep Tendon Reflexes: Reflexes are normal and symmetric.  Psychiatric:        Behavior: Behavior normal.        Thought Content: Thought content normal.        Judgment: Judgment normal.     Lab Results  Component Value Date   WBC 4.1 02/10/2022   HGB 14.2 02/10/2022   HCT 42.8 02/10/2022   PLT 278.0 02/10/2022   GLUCOSE 136 (H) 02/10/2022   CHOL 147 02/10/2022   TRIG 56.0 02/10/2022   HDL 42.70 02/10/2022   LDLCALC 93 02/10/2022   ALT 12 02/10/2022    AST 14 02/10/2022   NA 140 02/10/2022   K 3.7 02/10/2022   CL 102 02/10/2022   CREATININE 1.20 02/10/2022   BUN 14 02/10/2022   CO2 26 02/10/2022   TSH 1.82 02/10/2022   PSA 4.58 (H) 02/10/2022    DG Chest 2 View  Result Date: 12/23/2021 CLINICAL DATA:  Chest pain EXAM: CHEST - 2 VIEW COMPARISON:  11/07/2015 FINDINGS: The heart size and mediastinal contours are within normal limits. Both lungs are clear. The visualized skeletal structures are unremarkable. IMPRESSION: No acute abnormality of the lungs. Electronically Signed   By: Delanna Ahmadi M.D.   On: 12/23/2021 18:43    Assessment & Plan:   Problem List Items Addressed This Visit       Digestive   GERD    Dexilant qd      Relevant Medications   DEXILANT 60 MG capsule     Other   Vitamin D deficiency    On Vit D      Obesity (BMI 30-39.9)    Wegovy Rx - n/a      Headache - Primary    Imitrex prn Ibuprofen 600 mg tid pc prn w/caution      Relevant Medications   ibuprofen (ADVIL) 600 MG tablet   SUMAtriptan (IMITREX) 100 MG tablet      Meds ordered this encounter  Medications   ibuprofen (ADVIL) 600 MG tablet    Sig: Take twice a day x 2 weeks, then prn pain    Dispense:  100 tablet    Refill:  0   DEXILANT 60 MG capsule    Sig: TAKE 1 CAPSULE BY MOUTH AS NEEDED STRENGTH    Dispense:  90 capsule    Refill:  3   SUMAtriptan (IMITREX) 100 MG tablet    Sig: Take 1 tablet (100 mg total) by mouth once for 1 dose. May repeat in 2 hours if headache persists or recurs.    Dispense:  12 tablet    Refill:  3   TRIBENZOR 40-10-25 MG TABS    Sig: Take 1 tablet by mouth daily.    Dispense:  90 tablet    Refill:  3      Follow-up: Return in about 6 months (around 11/30/2022) for a follow-up visit.  Walker Kehr, MD

## 2022-06-01 NOTE — Assessment & Plan Note (Signed)
[facility] Rx - n/a

## 2022-06-01 NOTE — Patient Instructions (Signed)
ZepBound?

## 2022-06-16 ENCOUNTER — Telehealth: Payer: Self-pay | Admitting: Internal Medicine

## 2022-06-16 ENCOUNTER — Other Ambulatory Visit (HOSPITAL_COMMUNITY): Payer: Self-pay

## 2022-06-16 MED ORDER — ESOMEPRAZOLE MAGNESIUM 40 MG PO CPDR: 40.0000 mg | DELAYED_RELEASE_CAPSULE | Freq: Every day | ORAL | 11 refills | Status: DC

## 2022-06-16 NOTE — Telephone Encounter (Addendum)
Pharmacy Patient Advocate Encounter   Received notification that prior authorization for Dexilant '60mg'$  caps is required/requested.  Per Test Claim: Must use Esomeprazole, Lansoprazole, Omeprazole, Pantoprazole or Medically Necessary PA only - requires a prior authorization    Can the patient take any of the meds on the formulary? Is he unable to take due to a trial and inadequate treatment response or intolerance, or a contraindication? (Documentation is required)  Please advise. PA not submitted Key: [id number]

## 2022-06-16 NOTE — Telephone Encounter (Signed)
Patient called about DEXILANT 60 MG capsule and said he is out and that the pharmacy said that they didn't receive the refill. I called the pharmacy and they said they did receive it and that it was requiring a Prior Auth and had sent It back to [place]. Patient said he needs as soon as possible

## 2022-06-16 NOTE — Telephone Encounter (Signed)
A new prescription for Nexium was emailed to the pharmacy in place of [place].  Thanks

## 2022-06-17 ENCOUNTER — Telehealth: Payer: Self-pay | Admitting: Internal Medicine

## 2022-06-17 NOTE — Telephone Encounter (Signed)
Called pt no answer LMOM MD sent nexium to replace dexilant due to insurance denying ../l,mb

## 2022-06-17 NOTE — Telephone Encounter (Signed)
Patient called requesting a callback from [facility]. Best callback number is [phone].

## 2022-06-17 NOTE — Telephone Encounter (Signed)
Called pt back he states he tried the nexium before gives him a headache, he has also taking omeprazole does not work. Only thing that help is dexilant. Would like appeal letter.Marland KitchenJohny Oliver

## 2022-06-18 NOTE — Telephone Encounter (Signed)
Generated letter called pt to see if he has fax # for Appeal. No answer LMOM RTC.Marland KitchenJohny Chess

## 2022-06-18 NOTE — Telephone Encounter (Signed)
Lucy, Please type a letter.  Thank you

## 2022-06-21 NOTE — Telephone Encounter (Signed)
Pt call back he gave [phone] which is the telephone #for [facility]. Called # spoke w/ rep gave fax # to appeals at [phone]. Faxed appeal letter to # given.Marland KitchenJohny Chess

## 2022-06-21 NOTE — Telephone Encounter (Signed)
Called pt he states he will call his insurance for appeal #, and call back.Corey KitchenJohny Chess

## 2022-07-06 ENCOUNTER — Other Ambulatory Visit: Payer: Self-pay | Admitting: Internal Medicine

## 2022-07-29 ENCOUNTER — Telehealth: Payer: Self-pay | Admitting: *Deleted

## 2022-07-29 NOTE — Telephone Encounter (Signed)
Pt need PA on Wegovy. Submitted w/ (Key: [id number]) Your PA request has been sent to [facility].Marland KitchenJohny Chess

## 2022-07-30 NOTE — Telephone Encounter (Signed)
Rec'd determination med was DENIED. It states Your plan only covers this drug if you have been in a comprehensive weight management program for at least 6 months before starting this drug. We have denied your request because you have not been taking part in a comprehensive weight management program for at least 6 months.No alternative was given.Marland KitchenJohny Oliver

## 2022-08-06 ENCOUNTER — Telehealth: Payer: Self-pay | Admitting: Internal Medicine

## 2022-08-06 MED ORDER — DEXLANSOPRAZOLE 60 MG PO CPDR: 60.0000 mg | DELAYED_RELEASE_CAPSULE | Freq: Every day | ORAL | 1 refills | Status: DC

## 2022-08-06 NOTE — Telephone Encounter (Signed)
Patient is reguesting a 3 month supply of Dexilant - he does not like the nexium - [facility] [place], [place]

## 2022-08-06 NOTE — Telephone Encounter (Signed)
[facility].../lmb 

## 2022-08-12 ENCOUNTER — Other Ambulatory Visit: Payer: Self-pay | Admitting: *Deleted

## 2022-08-12 ENCOUNTER — Telehealth: Payer: Self-pay | Admitting: Internal Medicine

## 2022-08-12 ENCOUNTER — Other Ambulatory Visit (HOSPITAL_COMMUNITY): Payer: Self-pay

## 2022-08-12 NOTE — Telephone Encounter (Signed)
Patient called and said that a prior authorization needs to be sent for the dexilent

## 2022-08-12 NOTE — Telephone Encounter (Signed)
Pt need PA on Dexilant. Submitted w/ (Key: [id number]) PA sent to plan for review.Marland KitchenJohny Chess

## 2022-08-13 ENCOUNTER — Other Ambulatory Visit (HOSPITAL_COMMUNITY): Payer: Self-pay

## 2022-08-13 NOTE — Telephone Encounter (Signed)
Patient Advocate Encounter  Prior Authorization for Dexilant 60MG  dr capsules has been approved.    PA# [id number] Effective dates: 08/13/22 through 08/13/23

## 2022-08-13 NOTE — Telephone Encounter (Signed)
Rec'd determination med was APPROVED. Effective dates 08/13/2022 to 08/13/2023. Faxed approval to [employer] and notified pt.Marland KitchenJohny Chess

## 2022-08-31 ENCOUNTER — Telehealth: Payer: Self-pay | Admitting: Internal Medicine

## 2022-08-31 ENCOUNTER — Telehealth: Payer: Self-pay | Admitting: *Deleted

## 2022-08-31 NOTE — Telephone Encounter (Signed)
Patient dropped off document FMLA, to be filled out by provider. Patient requested to send it via Fax  and Call patient to pick up a hard copy as well within 7-days. Document is located in providers tray at front office.Please advise at Mobile [phone] (mobile)

## 2022-08-31 NOTE — Telephone Encounter (Signed)
Fill-out FMLA gave to MD for signature.Marland KitchenRaechel Chute

## 2022-08-31 NOTE — Telephone Encounter (Signed)
Pt called and stated he need a PA on Key # [id number] (OZEMPIC) (1 MG/DOSE). Submitted to plan waiting on response.Marland KitchenRaechel Chute

## 2022-09-01 NOTE — Telephone Encounter (Signed)
Rec'd determination med DENIED. It states Covered use is for type 2 diabetes mellitus which yu do not have.Marland KitchenRaechel Chute

## 2022-09-01 NOTE — Telephone Encounter (Signed)
Faxed FMLA to [employer] @ [phone].Marland KitchenRaechel Chute

## 2022-09-03 ENCOUNTER — Telehealth: Payer: Self-pay | Admitting: *Deleted

## 2022-09-03 NOTE — Telephone Encounter (Signed)
Rec'd call pt states the [facility] was not done. Insurance denied Ozempic. Inform pt we received PA on Ozempic and that was done. He called insurance will be sending [phone]. Inform pt will give him a call once we hear from insurance.   Submitted PA w/(Key: [employer] ) Insurance sent back mag stating  Wegovy PA with Limit [employer] STD 11-2021 **CAS WEB** ePA Review (DOC); Set has denial reasons loaded ty [id number]. Denial was sent back in March it states " If you have been in a weight management program for 6 months.Marland KitchenRaechel Chute

## 2022-09-06 NOTE — Telephone Encounter (Signed)
Notified pt w/ response. He keep saying the med was approved but the pharmacy was out. Inform pt due to limit supply on wegovy, ozempic or mounjaro a lot of insurance will deny if pt does not have Diabetes which he does not have.Marland KitchenRaechel Chute

## 2022-10-01 ENCOUNTER — Telehealth: Payer: Self-pay | Admitting: *Deleted

## 2022-10-01 NOTE — Telephone Encounter (Signed)
Prior auth started via cover my meds.  Awaiting determination.  Key: [id number]

## 2022-10-04 NOTE — Telephone Encounter (Signed)
We're pleased to let you know that we've approved your or your doctor's request for coverage for Wegovy (semaglutide injection) 0.25 mg/0.5 mL. You can now fill your prescription, and it will be covered according to your plan. As long as you remain covered by your prescription drug plan and there are no changes to your plan benefits, this request is approved from 10/04/2022 to 05/06/2023. When this approval expires, please speak to your doctor about your treatment.

## 2022-11-10 ENCOUNTER — Telehealth: Payer: Self-pay | Admitting: Internal Medicine

## 2022-11-10 NOTE — Telephone Encounter (Signed)
Patient called and said he had a dosage question about Wegovy. He would like a call back at [phone].

## 2022-11-11 NOTE — Telephone Encounter (Signed)
Called pt he states he is ready for the next dosage. [facility]

## 2022-11-12 MED ORDER — WEGOVY 1 MG/0.5ML ~~LOC~~ SOAJ: 1.0000 mg | SUBCUTANEOUS | 3 refills | Status: DC

## 2022-11-12 NOTE — Telephone Encounter (Signed)
Okay.  Done. Schedule follow-up with his visit with me.  Thanks

## 2022-11-15 NOTE — Telephone Encounter (Signed)
Patient called back and wanted to know when PCP wants this appointment to be scheduled. Patient has not started the medication yet. He was assuming it would be a little while since he was just prescribed the medication. Patient would like a call back at [phone].

## 2022-11-15 NOTE — Telephone Encounter (Signed)
Make the appt 2 weeks after starting new dosage...Raechel Chute

## 2022-11-15 NOTE — Telephone Encounter (Signed)
Called pt gave MD response. Pt boss was calling he states he will call back to make appt...Corey Oliver

## 2022-12-31 ENCOUNTER — Telehealth: Payer: Self-pay | Admitting: Internal Medicine

## 2022-12-31 NOTE — Telephone Encounter (Signed)
Patient would like a call to discuss his wygovey rx.  [phone]

## 2022-12-31 NOTE — Telephone Encounter (Signed)
Called pt back he states MD sent in [facility] 1 mg/0.5mg  . He states that dosage is too much. He states he should have went on 0.5 mg/0.63ml. [facility]lmb

## 2023-01-05 MED ORDER — WEGOVY 0.5 MG/0.5ML ~~LOC~~ SOAJ: 0.5000 mg | SUBCUTANEOUS | 3 refills | Status: DC

## 2023-01-05 NOTE — Telephone Encounter (Signed)
OK  Keep ROV Thx 

## 2023-01-05 NOTE — Telephone Encounter (Signed)
Notified pt w/MD response.../lmb 

## 2023-01-17 ENCOUNTER — Encounter: Payer: Self-pay | Admitting: Internal Medicine

## 2023-01-17 ENCOUNTER — Ambulatory Visit: Payer: 59 | Admitting: Internal Medicine

## 2023-01-17 VITALS — BP 150/92 | HR 93 | Temp 97.7°F | Ht 67.0 in | Wt 216.0 lb

## 2023-01-17 DIAGNOSIS — E669 Obesity, unspecified: Secondary | ICD-10-CM

## 2023-01-17 DIAGNOSIS — R6882 Decreased libido: Secondary | ICD-10-CM

## 2023-01-17 DIAGNOSIS — E559 Vitamin D deficiency, unspecified: Secondary | ICD-10-CM | POA: Diagnosis not present

## 2023-01-17 DIAGNOSIS — R972 Elevated prostate specific antigen [PSA]: Secondary | ICD-10-CM | POA: Diagnosis not present

## 2023-01-17 DIAGNOSIS — E538 Deficiency of other specified B group vitamins: Secondary | ICD-10-CM

## 2023-01-17 DIAGNOSIS — R7989 Other specified abnormal findings of blood chemistry: Secondary | ICD-10-CM | POA: Diagnosis not present

## 2023-01-17 DIAGNOSIS — R5383 Other fatigue: Secondary | ICD-10-CM

## 2023-01-17 NOTE — Assessment & Plan Note (Signed)
Check TSH, FT4 

## 2023-01-17 NOTE — Assessment & Plan Note (Addendum)
F/u w/Dr Logan Bores Monitor PSA Prostate MRI is pending

## 2023-01-17 NOTE — Assessment & Plan Note (Signed)
Wt Readings from Last 3 Encounters:  01/17/23 216 lb (98 kg)  06/01/22 219 lb (99.3 kg)  02/11/22 221 lb 6.4 oz (100.4 kg)

## 2023-01-17 NOTE — Assessment & Plan Note (Signed)
On Vit D 

## 2023-01-17 NOTE — Progress Notes (Signed)
Subjective:  Patient ID: Corey Oliver, male    DOB: [birth year]  Age: 57 y.o. MRN: [id number]  CC: Follow-up (6 month f/u on the [facility]. Patient wants to discuss the dosage of his medication. Pt needs 0.5mg  dose due to skipping the 0.5 mg dose and given the 1mg  which states this dose is to strong... Sxs of 1mg  dose persistent nausea.)   HPI Trigger A Nickolson presents for obesity, on We  Outpatient Medications Prior to Visit  Medication Sig Dispense Refill   acetaminophen (TYLENOL) 325 MG tablet Take 2 tablets (650 mg total) by mouth every 6 (six) hours as needed. 30 tablet 0   Albuterol Sulfate (PROAIR RESPICLICK) 108 (90 Base) MCG/ACT AEPB Inhale 1-2 puffs into the lungs 4 (four) times daily as needed. 1 each 2   B Complex-Folic Acid (B COMPLEX PLUS) TABS Take 1 tablet by mouth daily. 100 tablet 3   buPROPion (WELLBUTRIN XL) 150 MG 24 hr tablet Take 1 tablet (150 mg total) by mouth daily. 90 tablet 3   Cholecalciferol (VITAMIN D3) 50 MCG (2000 UT) capsule Take 1 capsule (2,000 Units total) by mouth daily. 100 capsule 3   clobetasol ointment (TEMOVATE) 0.05 % Apply topically 3 (three) times a week.     Clobetasol Propionate 0.05 % shampoo Apply topically.     dexlansoprazole (DEXILANT) 60 MG capsule Take 1 capsule (60 mg total) by mouth daily. 90 capsule 1   finasteride (PROSCAR) 5 MG tablet Take 0.5 tablets by mouth daily.     Fluocin-Hydroquinone-Tretinoin (TRI-LUMA) 0.01-4-0.05 % CREA As directed 30 g 3   fluticasone (FLONASE) 50 MCG/ACT nasal spray Place 2 sprays into the nose daily. 16 g 10   hydrocortisone-pramoxine (ANALPRAM-HC) 2.5-1 % rectal cream Place 1 application rectally 3 (three) times daily. 30 g 0   hydroquinone 4 % cream Apply topically at bedtime.     ibuprofen (ADVIL) 600 MG tablet Take twice a day x 2 weeks, then prn pain 100 tablet 0   ipratropium (ATROVENT) 0.03 % nasal spray Place 2 sprays into both nostrils 2 (two) times daily. Do not use for more than 5days. 30 mL 0    ondansetron (ZOFRAN) 4 MG tablet Take 1 tablet (4 mg total) by mouth as directed. Take one Zofran 4 mg tablet 30-60 minutes before each prep dose 2 tablet 0   Semaglutide-Weight Management (WEGOVY) 0.5 MG/0.5ML SOAJ Inject 0.5 mg into the skin once a week. 2 mL 3   tadalafil (CIALIS) 5 MG tablet Take 1 tablet (5 mg total) by mouth daily. 90 tablet 3   triamcinolone ointment (KENALOG) 0.1 % Apply 1 application topically 2 (two) times daily. 80 g 3   TRIBENZOR 40-10-25 MG TABS Take 1 tablet by mouth daily. 90 tablet 3   SUMAtriptan (IMITREX) 100 MG tablet Take 1 tablet (100 mg total) by mouth once for 1 dose. May repeat in 2 hours if headache persists or recurs. 12 tablet 3   No facility-administered medications prior to visit.    ROS: Review of Systems  Constitutional:  Positive for fatigue. Negative for appetite change and unexpected weight change.  HENT:  Negative for congestion, nosebleeds, sneezing, sore throat and trouble swallowing.   Eyes:  Negative for itching and visual disturbance.  Respiratory:  Negative for cough.   Cardiovascular:  Negative for chest pain, palpitations and leg swelling.  Gastrointestinal:  Negative for abdominal distention, blood in stool, diarrhea and nausea.  Genitourinary:  Negative for frequency and hematuria.  Musculoskeletal:  Negative for back pain, gait problem, joint swelling and neck pain.  Skin:  Negative for rash.  Neurological:  Negative for dizziness, tremors, speech difficulty and weakness.  Psychiatric/Behavioral:  Negative for agitation, dysphoric mood and sleep disturbance. The patient is not nervous/anxious.     Objective:  BP (!) 150/92   Pulse 93   Temp 97.7 F (36.5 C) (Oral)   Ht 5\' 7"  (1.702 m)   Wt 216 lb (98 kg)   SpO2 97%   BMI 33.83 kg/m   BP Readings from Last 3 Encounters:  01/17/23 (!) 150/92  06/01/22 128/80  02/11/22 130/88    Wt Readings from Last 3 Encounters:  01/17/23 216 lb (98 kg)  06/01/22 219 lb (99.3  kg)  02/11/22 221 lb 6.4 oz (100.4 kg)    Physical Exam Constitutional:      General: He is not in acute distress.    Appearance: He is well-developed. He is obese.     Comments: NAD  Eyes:     Conjunctiva/sclera: Conjunctivae normal.     Pupils: Pupils are equal, round, and reactive to light.  Neck:     Thyroid: No thyromegaly.     Vascular: No JVD.  Cardiovascular:     Rate and Rhythm: Normal rate and regular rhythm.     Heart sounds: Normal heart sounds. No murmur heard.    No friction rub. No gallop.  Pulmonary:     Effort: Pulmonary effort is normal. No respiratory distress.     Breath sounds: Normal breath sounds. No wheezing or rales.  Chest:     Chest wall: No tenderness.  Abdominal:     General: Bowel sounds are normal. There is no distension.     Palpations: Abdomen is soft. There is no mass.     Tenderness: There is no abdominal tenderness. There is no guarding or rebound.  Musculoskeletal:        General: No tenderness. Normal range of motion.     Cervical back: Normal range of motion.  Lymphadenopathy:     Cervical: No cervical adenopathy.  Skin:    General: Skin is warm and dry.     Findings: No rash.  Neurological:     Mental Status: He is alert and oriented to person, place, and time.     Cranial Nerves: No cranial nerve deficit.     Motor: No abnormal muscle tone.     Coordination: Coordination normal.     Gait: Gait normal.     Deep Tendon Reflexes: Reflexes are normal and symmetric.  Psychiatric:        Behavior: Behavior normal.        Thought Content: Thought content normal.        Judgment: Judgment normal.     Lab Results  Component Value Date   WBC 4.1 02/10/2022   HGB 14.2 02/10/2022   HCT 42.8 02/10/2022   PLT 278.0 02/10/2022   GLUCOSE 136 (H) 02/10/2022   CHOL 147 02/10/2022   TRIG 56.0 02/10/2022   HDL 42.70 02/10/2022   LDLCALC 93 02/10/2022   ALT 12 02/10/2022   AST 14 02/10/2022   NA 140 02/10/2022   K 3.7 02/10/2022    CL 102 02/10/2022   CREATININE 1.20 02/10/2022   BUN 14 02/10/2022   CO2 26 02/10/2022   TSH 1.82 02/10/2022   PSA 4.58 (H) 02/10/2022    DG Chest 2 View  Result Date: 12/23/2021 CLINICAL DATA:  Chest pain EXAM: CHEST -  2 VIEW COMPARISON:  11/07/2015 FINDINGS: The heart size and mediastinal contours are within normal limits. Both lungs are clear. The visualized skeletal structures are unremarkable. IMPRESSION: No acute abnormality of the lungs. Electronically Signed   By: Jearld Lesch M.D.   On: 12/23/2021 18:43    Assessment & Plan:   Problem List Items Addressed This Visit     Abnormal TSH    Check TSH, FT4       B12 deficiency - Primary    On B complex      Elevated prostate specific antigen (PSA)    F/u w/Dr Logan Bores Monitor PSA Prostate MRI is pending      Fatigue    Work/stress related      Libido, decreased    F/u w/Dr Logan Bores Not better      Obesity (BMI 30-39.9)    Wt Readings from Last 3 Encounters:  01/17/23 216 lb (98 kg)  06/01/22 219 lb (99.3 kg)  02/11/22 221 lb 6.4 oz (100.4 kg)         Vitamin D deficiency    On Vit D         No orders of the defined types were placed in this encounter.     Follow-up: Return for a follow-up visit.  Sonda Primes, MD

## 2023-01-17 NOTE — Assessment & Plan Note (Signed)
F/u w/Dr Logan Bores Not better

## 2023-01-17 NOTE — Assessment & Plan Note (Signed)
Work/stress related

## 2023-01-17 NOTE — Assessment & Plan Note (Signed)
On B complex 

## 2023-01-20 ENCOUNTER — Other Ambulatory Visit (INDEPENDENT_AMBULATORY_CARE_PROVIDER_SITE_OTHER): Payer: 59

## 2023-01-20 ENCOUNTER — Encounter: Payer: Self-pay | Admitting: Internal Medicine

## 2023-01-20 DIAGNOSIS — R519 Headache, unspecified: Secondary | ICD-10-CM

## 2023-01-20 DIAGNOSIS — E559 Vitamin D deficiency, unspecified: Secondary | ICD-10-CM | POA: Diagnosis not present

## 2023-01-20 DIAGNOSIS — I10 Essential (primary) hypertension: Secondary | ICD-10-CM

## 2023-01-20 DIAGNOSIS — R739 Hyperglycemia, unspecified: Secondary | ICD-10-CM | POA: Diagnosis not present

## 2023-01-20 DIAGNOSIS — E538 Deficiency of other specified B group vitamins: Secondary | ICD-10-CM

## 2023-01-20 LAB — CBC WITH DIFFERENTIAL/PLATELET
Basophils Absolute: 0.1 10*3/uL (ref 0.0–0.1)
Basophils Relative: 1.8 % (ref 0.0–3.0)
Eosinophils Absolute: 0.2 10*3/uL (ref 0.0–0.7)
Eosinophils Relative: 3.9 % (ref 0.0–5.0)
HCT: 44.4 % (ref 39.0–52.0)
Hemoglobin: 14.5 g/dL (ref 13.0–17.0)
Lymphocytes Relative: 40.7 % (ref 12.0–46.0)
Lymphs Abs: 1.6 10*3/uL (ref 0.7–4.0)
MCHC: 32.7 g/dL (ref 30.0–36.0)
MCV: 88.5 fl (ref 78.0–100.0)
Monocytes Absolute: 0.5 10*3/uL (ref 0.1–1.0)
Monocytes Relative: 11.3 % (ref 3.0–12.0)
Neutro Abs: 1.7 10*3/uL (ref 1.4–7.7)
Neutrophils Relative %: 42.3 % — ABNORMAL LOW (ref 43.0–77.0)
Platelets: 313 10*3/uL (ref 150.0–400.0)
RBC: 5.02 Mil/uL (ref 4.22–5.81)
RDW: 13.5 % (ref 11.5–15.5)
WBC: 4 10*3/uL (ref 4.0–10.5)

## 2023-01-20 LAB — COMPREHENSIVE METABOLIC PANEL
ALT: 11 U/L (ref 0–53)
AST: 13 U/L (ref 0–37)
Albumin: 4 g/dL (ref 3.5–5.2)
Alkaline Phosphatase: 59 U/L (ref 39–117)
BUN: 14 mg/dL (ref 6–23)
CO2: 26 mEq/L (ref 19–32)
Calcium: 9.2 mg/dL (ref 8.4–10.5)
Chloride: 105 mEq/L (ref 96–112)
Creatinine, Ser: 1.15 mg/dL (ref 0.40–1.50)
GFR: 70.69 mL/min (ref >60.00)
Glucose, Bld: 93 mg/dL (ref 70–99)
Potassium: 3.8 mEq/L (ref 3.5–5.1)
Sodium: 138 mEq/L (ref 135–145)
Total Bilirubin: 0.4 mg/dL (ref 0.2–1.2)
Total Protein: 6.8 g/dL (ref 6.0–8.3)

## 2023-01-20 LAB — TSH: TSH: 1.91 u[IU]/mL (ref 0.35–5.50)

## 2023-01-20 LAB — URINALYSIS
Bilirubin Urine: NEGATIVE
Hgb urine dipstick: NEGATIVE
Ketones, ur: NEGATIVE
Leukocytes,Ua: NEGATIVE
Nitrite: NEGATIVE
Specific Gravity, Urine: 1.02 (ref 1.000–1.030)
Total Protein, Urine: NEGATIVE
Urine Glucose: NEGATIVE
Urobilinogen, UA: 1 (ref 0.0–1.0)
pH: 6.5 (ref 5.0–8.0)

## 2023-01-20 LAB — LIPID PANEL
Cholesterol: 138 mg/dL (ref 0–200)
HDL: 40 mg/dL (ref >39.00)
LDL Cholesterol: 89 mg/dL (ref 0–99)
NonHDL: 98.06
Total CHOL/HDL Ratio: 3
Triglycerides: 46 mg/dL (ref 0.0–149.0)
VLDL: 9.2 mg/dL (ref 0.0–40.0)

## 2023-01-20 LAB — PSA: PSA: 3.75 ng/mL (ref 0.10–4.00)

## 2023-01-20 LAB — HEMOGLOBIN A1C: Hgb A1c MFr Bld: 5.8 % (ref 4.6–6.5)

## 2023-02-14 ENCOUNTER — Encounter: Payer: Self-pay | Admitting: Internal Medicine

## 2023-02-14 ENCOUNTER — Telehealth: Payer: Self-pay | Admitting: Internal Medicine

## 2023-02-14 ENCOUNTER — Ambulatory Visit: Payer: 59 | Admitting: Internal Medicine

## 2023-02-14 VITALS — BP 126/70 | HR 97 | Temp 98.6°F | Ht 67.0 in | Wt 220.0 lb

## 2023-02-14 DIAGNOSIS — E559 Vitamin D deficiency, unspecified: Secondary | ICD-10-CM

## 2023-02-14 DIAGNOSIS — E538 Deficiency of other specified B group vitamins: Secondary | ICD-10-CM | POA: Diagnosis not present

## 2023-02-14 DIAGNOSIS — F329 Major depressive disorder, single episode, unspecified: Secondary | ICD-10-CM

## 2023-02-14 DIAGNOSIS — R972 Elevated prostate specific antigen [PSA]: Secondary | ICD-10-CM | POA: Diagnosis not present

## 2023-02-14 DIAGNOSIS — G43909 Migraine, unspecified, not intractable, without status migrainosus: Secondary | ICD-10-CM | POA: Insufficient documentation

## 2023-02-14 MED ORDER — RIZATRIPTAN BENZOATE 10 MG PO TBDP: 10.0000 mg | ORAL_TABLET | ORAL | 5 refills | Status: AC | PRN

## 2023-02-14 NOTE — Assessment & Plan Note (Addendum)
On B complex

## 2023-02-14 NOTE — Assessment & Plan Note (Signed)
Prostate MRI/bx is pending 2024

## 2023-02-14 NOTE — Progress Notes (Signed)
Subjective:  Patient ID: Corey Oliver, male    DOB: 29-Oct-1965  Age: 57 y.o. MRN: [id number]  CC: Follow-up (1 MNTH F/U)   HPI Corey Oliver presents for migraines, stress, elevated PSA  Outpatient Medications Prior to Visit  Medication Sig Dispense Refill   acetaminophen (TYLENOL) 325 MG tablet Take 2 tablets (650 mg total) by mouth every 6 (six) hours as needed. 30 tablet 0   Albuterol Sulfate (PROAIR RESPICLICK) 108 (90 Base) MCG/ACT AEPB Inhale 1-2 puffs into the lungs 4 (four) times daily as needed. 1 each 2   B Complex-Folic Acid (B COMPLEX PLUS) TABS Take 1 tablet by mouth daily. 100 tablet 3   buPROPion (WELLBUTRIN XL) 150 MG 24 hr tablet Take 1 tablet (150 mg total) by mouth daily. 90 tablet 3   Cholecalciferol (VITAMIN D3) 50 MCG (2000 UT) capsule Take 1 capsule (2,000 Units total) by mouth daily. 100 capsule 3   clobetasol ointment (TEMOVATE) 0.05 % Apply topically 3 (three) times a week.     Clobetasol Propionate 0.05 % shampoo Apply topically.     dexlansoprazole (DEXILANT) 60 MG capsule Take 1 capsule (60 mg total) by mouth daily. 90 capsule 1   finasteride (PROSCAR) 5 MG tablet Take 0.5 tablets by mouth daily.     Fluocin-Hydroquinone-Tretinoin (TRI-LUMA) 0.01-4-0.05 % CREA As directed 30 g 3   fluticasone (FLONASE) 50 MCG/ACT nasal spray Place 2 sprays into the nose daily. 16 g 10   hydrocortisone-pramoxine (ANALPRAM-HC) 2.5-1 % rectal cream Place 1 application rectally 3 (three) times daily. 30 g 0   hydroquinone 4 % cream Apply topically at bedtime.     ibuprofen (ADVIL) 600 MG tablet Take twice a day x 2 weeks, then prn pain 100 tablet 0   ipratropium (ATROVENT) 0.03 % nasal spray Place 2 sprays into both nostrils 2 (two) times daily. Do not use for more than 5days. 30 mL 0   ondansetron (ZOFRAN) 4 MG tablet Take 1 tablet (4 mg total) by mouth as directed. Take one Zofran 4 mg tablet 30-60 minutes before each prep dose 2 tablet 0   Semaglutide-Weight Management  (WEGOVY) 0.5 MG/0.5ML SOAJ Inject 0.5 mg into the skin once a week. 2 mL 3   tadalafil (CIALIS) 5 MG tablet Take 1 tablet (5 mg total) by mouth daily. 90 tablet 3   triamcinolone ointment (KENALOG) 0.1 % Apply 1 application topically 2 (two) times daily. 80 g 3   TRIBENZOR 40-10-25 MG TABS Take 1 tablet by mouth daily. 90 tablet 3   SUMAtriptan (IMITREX) 100 MG tablet Take 1 tablet (100 mg total) by mouth once for 1 dose. May repeat in 2 hours if headache persists or recurs. 12 tablet 3   No facility-administered medications prior to visit.    ROS: Review of Systems  Constitutional:  Positive for fatigue. Negative for appetite change and unexpected weight change.  HENT:  Negative for congestion, nosebleeds, sneezing, sore throat and trouble swallowing.   Eyes:  Negative for itching and visual disturbance.  Respiratory:  Negative for cough.   Cardiovascular:  Negative for chest pain, palpitations and leg swelling.  Gastrointestinal:  Negative for abdominal distention, blood in stool, diarrhea and nausea.  Genitourinary:  Negative for frequency and hematuria.  Musculoskeletal:  Negative for back pain, gait problem, joint swelling and neck pain.  Skin:  Negative for rash.  Neurological:  Negative for dizziness, tremors, speech difficulty and weakness.  Psychiatric/Behavioral:  Positive for dysphoric mood and sleep disturbance.  Negative for agitation, self-injury and suicidal ideas. The patient is nervous/anxious.     Objective:  BP 126/70 (BP Location: Left Arm, Patient Position: Sitting, Cuff Size: Large)   Pulse 97   Temp 98.6 F (37 C) (Oral)   Ht 5\' 7"  (1.702 m)   Wt 220 lb (99.8 kg)   SpO2 99%   BMI 34.46 kg/m   BP Readings from Last 3 Encounters:  02/14/23 126/70  01/17/23 (!) 150/92  06/01/22 128/80    Wt Readings from Last 3 Encounters:  02/14/23 220 lb (99.8 kg)  01/17/23 216 lb (98 kg)  06/01/22 219 lb (99.3 kg)    Physical Exam Constitutional:      General: He  is not in acute distress.    Appearance: He is well-developed.     Comments: NAD  Eyes:     Conjunctiva/sclera: Conjunctivae normal.     Pupils: Pupils are equal, round, and reactive to light.  Neck:     Thyroid: No thyromegaly.     Vascular: No JVD.  Cardiovascular:     Rate and Rhythm: Normal rate and regular rhythm.     Heart sounds: Normal heart sounds. No murmur heard.    No friction rub. No gallop.  Pulmonary:     Effort: Pulmonary effort is normal. No respiratory distress.     Breath sounds: Normal breath sounds. No wheezing or rales.  Chest:     Chest wall: No tenderness.  Abdominal:     General: Bowel sounds are normal. There is no distension.     Palpations: Abdomen is soft. There is no mass.     Tenderness: There is no abdominal tenderness. There is no guarding or rebound.  Musculoskeletal:        General: No tenderness. Normal range of motion.     Cervical back: Normal range of motion.  Lymphadenopathy:     Cervical: No cervical adenopathy.  Skin:    General: Skin is warm and dry.     Findings: No rash.  Neurological:     Mental Status: He is alert and oriented to person, place, and time.     Cranial Nerves: No cranial nerve deficit.     Motor: No abnormal muscle tone.     Coordination: Coordination normal.     Gait: Gait normal.     Deep Tendon Reflexes: Reflexes are normal and symmetric.  Psychiatric:        Behavior: Behavior normal.        Thought Content: Thought content normal.        Judgment: Judgment normal.     Lab Results  Component Value Date   WBC 4.0 01/20/2023   HGB 14.5 01/20/2023   HCT 44.4 01/20/2023   PLT 313.0 01/20/2023   GLUCOSE 93 01/20/2023   CHOL 138 01/20/2023   TRIG 46.0 01/20/2023   HDL 40.00 01/20/2023   LDLCALC 89 01/20/2023   ALT 11 01/20/2023   AST 13 01/20/2023   NA 138 01/20/2023   K 3.8 01/20/2023   CL 105 01/20/2023   CREATININE 1.15 01/20/2023   BUN 14 01/20/2023   CO2 26 01/20/2023   TSH 1.91 01/20/2023    PSA 3.75 01/20/2023   HGBA1C 5.8 01/20/2023    DG Chest 2 View  Result Date: 12/23/2021 CLINICAL DATA:  Chest pain EXAM: CHEST - 2 VIEW COMPARISON:  11/07/2015 FINDINGS: The heart size and mediastinal contours are within normal limits. Both lungs are clear. The visualized skeletal structures are unremarkable.  IMPRESSION: No acute abnormality of the lungs. Electronically Signed   By: Jearld Lesch M.D.   On: 12/23/2021 18:43    Assessment & Plan:   Problem List Items Addressed This Visit     Vitamin D deficiency    On Vit D      B12 deficiency - Primary    On B complex       Reactive depression (situational)    Worse Needs time off 9/24-10/15/2024      Elevated prostate specific antigen (PSA)    Prostate MRI/bx is pending 2024      Migraines    Worse Try Maxalt, Exedrin prn      Relevant Medications   rizatriptan (MAXALT-MLT) 10 MG disintegrating tablet      Meds ordered this encounter  Medications   rizatriptan (MAXALT-MLT) 10 MG disintegrating tablet    Sig: Take 1 tablet (10 mg total) by mouth as needed for migraine. May repeat in 2 hours if needed    Dispense:  10 tablet    Refill:  5      Follow-up: Return in about 3 months (around 05/16/2023) for a follow-up visit.  Sonda Primes, MD

## 2023-02-14 NOTE — Assessment & Plan Note (Signed)
Worse Try Maxalt, Exedrin prn

## 2023-02-14 NOTE — Assessment & Plan Note (Signed)
Worse Needs time off 9/24-10/15/2024

## 2023-02-14 NOTE — Telephone Encounter (Signed)
Patient dropped off document Limited wage continuation plan, to be filled out by provider. Patient requested to send it back via Call Patient to pick up within ASAP. Document is located in providers tray at front office.Please advise at Mobile [phone] (mobile)

## 2023-02-14 NOTE — Assessment & Plan Note (Signed)
On Vit D

## 2023-02-15 ENCOUNTER — Telehealth: Payer: Self-pay | Admitting: Internal Medicine

## 2023-02-15 NOTE — Telephone Encounter (Signed)
Patient called about paperwork and asked to speak with the nurse. He would like a call back at [phone].

## 2023-02-15 NOTE — Telephone Encounter (Signed)
Error

## 2023-02-15 NOTE — Telephone Encounter (Signed)
Spoke with pt about forms.

## 2023-02-18 ENCOUNTER — Other Ambulatory Visit: Payer: Self-pay | Admitting: Internal Medicine

## 2023-02-20 NOTE — Telephone Encounter (Signed)
He has a prescription for ibuprofen 600 mg

## 2023-02-23 DIAGNOSIS — Z0279 Encounter for issue of other medical certificate: Secondary | ICD-10-CM

## 2023-02-24 NOTE — Telephone Encounter (Signed)
Spoke with pt and was able to confirm forms have been faxed.  **Pt has requested completed forms be sent to his home via mail and I was able to put the forms out today to be mailed to pt.

## 2023-03-07 ENCOUNTER — Telehealth: Payer: Self-pay | Admitting: Internal Medicine

## 2023-03-07 NOTE — Telephone Encounter (Signed)
Patient called and said his work is requiring a note saying he can go back to work on 03/08/2023. He has a note already saying he is out until tomorrow, but his work is asking for specific wording that says he can go back tomorrow. Patient would like to know if he can get the note today. Best callback is [phone].

## 2023-03-08 NOTE — Telephone Encounter (Signed)
Okay. Thank you.

## 2023-03-10 ENCOUNTER — Encounter: Payer: Self-pay | Admitting: Internal Medicine

## 2023-03-10 NOTE — Telephone Encounter (Signed)
Letter has been printed and is available on pts MyChart.

## 2023-03-24 ENCOUNTER — Telehealth: Payer: Self-pay | Admitting: Internal Medicine

## 2023-03-24 NOTE — Telephone Encounter (Signed)
Patient is changing pharmacies and would like this medication to be sent to the below pharmacy. He requested a 90 day supply for the refill.   Prescription Request  03/24/2023  LOV: 02/14/2023  What is the name of the medication or equipment?  buPROPion (WELLBUTRIN XL) 150 MG 24 hr tablet  Have you contacted your pharmacy to request a refill? Yes   Which pharmacy would you like this sent to?  [facility] in [place], [place]   Patient notified that their request is being sent to the clinical staff for review and that they should receive a response within 2 business days.   Please advise at Mobile [phone] (mobile)

## 2023-03-28 MED ORDER — BUPROPION HCL ER (XL) 150 MG PO TB24: 150.0000 mg | ORAL_TABLET | Freq: Every day | ORAL | 1 refills | Status: DC

## 2023-03-28 NOTE — Telephone Encounter (Signed)
Okay.  Thanks.

## 2023-04-28 ENCOUNTER — Encounter: Payer: Self-pay | Admitting: Internal Medicine

## 2023-04-28 ENCOUNTER — Ambulatory Visit: Payer: 59 | Admitting: Internal Medicine

## 2023-04-28 VITALS — BP 132/82 | HR 68 | Temp 98.2°F | Ht 67.0 in | Wt 218.4 lb

## 2023-04-28 DIAGNOSIS — E291 Testicular hypofunction: Secondary | ICD-10-CM

## 2023-04-28 DIAGNOSIS — C61 Malignant neoplasm of prostate: Secondary | ICD-10-CM | POA: Diagnosis not present

## 2023-04-28 DIAGNOSIS — F329 Major depressive disorder, single episode, unspecified: Secondary | ICD-10-CM | POA: Diagnosis not present

## 2023-04-28 DIAGNOSIS — E559 Vitamin D deficiency, unspecified: Secondary | ICD-10-CM | POA: Diagnosis not present

## 2023-04-28 MED ORDER — LORAZEPAM 1 MG PO TABS: 1.0000 mg | ORAL_TABLET | Freq: Four times a day (QID) | ORAL | 1 refills | Status: AC | PRN

## 2023-04-28 NOTE — Assessment & Plan Note (Signed)
Worse due to prostate cancer - new dx (Dr Logan Bores): Gleason 3+4=7. Discussed

## 2023-04-28 NOTE — Assessment & Plan Note (Signed)
03/2023 prostate cancer - new dx (Dr Logan Bores): Gleason 3+4=7.

## 2023-04-28 NOTE — Assessment & Plan Note (Signed)
On Vit D 

## 2023-04-28 NOTE — Assessment & Plan Note (Signed)
03/2023 prostate cancer - new dx (Dr Logan Bores): Gleason 3+4=7. C/o stress w/the above

## 2023-04-28 NOTE — Progress Notes (Signed)
Subjective:  Patient ID: Corey Oliver, male    DOB: 05/04/1966  Age: 57 y.o. MRN: [id number]  CC: Medical Management of Chronic Issues (3 month follow up. PT notes of occasional tingling in right hand/wrist. Doesn't note of any motions that cause this to happen but that it occurs at random)   HPI Corey Oliver presents for prostate cancer - new dx (Dr Logan Bores): Gleason 3+4=7. C/o stress w/the above  Outpatient Medications Prior to Visit  Medication Sig Dispense Refill   acetaminophen (TYLENOL) 325 MG tablet Take 2 tablets (650 mg total) by mouth every 6 (six) hours as needed. 30 tablet 0   Albuterol Sulfate (PROAIR RESPICLICK) 108 (90 Base) MCG/ACT AEPB Inhale 1-2 puffs into the lungs 4 (four) times daily as needed. 1 each 2   B Complex-Folic Acid (B COMPLEX PLUS) TABS Take 1 tablet by mouth daily. 100 tablet 3   buPROPion (WELLBUTRIN XL) 150 MG 24 hr tablet Take 1 tablet (150 mg total) by mouth daily. 90 tablet 1   Cholecalciferol (VITAMIN D3) 50 MCG (2000 UT) capsule Take 1 capsule (2,000 Units total) by mouth daily. 100 capsule 3   clobetasol ointment (TEMOVATE) 0.05 % Apply topically 3 (three) times a week.     Clobetasol Propionate 0.05 % shampoo Apply topically.     dexlansoprazole (DEXILANT) 60 MG capsule Take 1 capsule (60 mg total) by mouth daily. 90 capsule 1   finasteride (PROSCAR) 5 MG tablet Take 0.5 tablets by mouth daily.     Fluocin-Hydroquinone-Tretinoin (TRI-LUMA) 0.01-4-0.05 % CREA As directed 30 g 3   fluticasone (FLONASE) 50 MCG/ACT nasal spray Place 2 sprays into the nose daily. 16 g 10   hydrocortisone-pramoxine (ANALPRAM-HC) 2.5-1 % rectal cream Place 1 application rectally 3 (three) times daily. 30 g 0   hydroquinone 4 % cream Apply topically at bedtime.     ibuprofen (ADVIL) 600 MG tablet Take twice a day x 2 weeks, then prn pain 100 tablet 0   ipratropium (ATROVENT) 0.03 % nasal spray Place 2 sprays into both nostrils 2 (two) times daily. Do not use for more than  5days. 30 mL 0   ondansetron (ZOFRAN) 4 MG tablet Take 1 tablet (4 mg total) by mouth as directed. Take one Zofran 4 mg tablet 30-60 minutes before each prep dose 2 tablet 0   rizatriptan (MAXALT-MLT) 10 MG disintegrating tablet Take 1 tablet (10 mg total) by mouth as needed for migraine. May repeat in 2 hours if needed 10 tablet 5   Semaglutide-Weight Management (WEGOVY) 0.5 MG/0.5ML SOAJ Inject 0.5 mg into the skin once a week. 2 mL 3   tadalafil (CIALIS) 5 MG tablet Take 1 tablet (5 mg total) by mouth daily. 90 tablet 3   triamcinolone ointment (KENALOG) 0.1 % Apply 1 application topically 2 (two) times daily. 80 g 3   TRIBENZOR 40-10-25 MG TABS Take 1 tablet by mouth daily. 90 tablet 3   No facility-administered medications prior to visit.    ROS: Review of Systems  Constitutional:  Negative for appetite change, fatigue and unexpected weight change.  HENT:  Negative for congestion, nosebleeds, sneezing, sore throat and trouble swallowing.   Eyes:  Negative for itching and visual disturbance.  Respiratory:  Negative for cough.   Cardiovascular:  Negative for chest pain, palpitations and leg swelling.  Gastrointestinal:  Negative for abdominal distention, blood in stool, diarrhea and nausea.  Genitourinary:  Negative for frequency and hematuria.  Musculoskeletal:  Negative for back pain,  gait problem, joint swelling and neck pain.  Skin:  Negative for rash.  Neurological:  Negative for dizziness, tremors, speech difficulty and weakness.  Psychiatric/Behavioral:  Positive for sleep disturbance. Negative for agitation, decreased concentration, dysphoric mood and suicidal ideas. The patient is nervous/anxious.     Objective:  BP 132/82   Pulse 68   Temp 98.2 F (36.8 C) (Oral)   Ht 5\' 7"  (1.702 m)   Wt 218 lb 6.4 oz (99.1 kg)   SpO2 96%   BMI 34.21 kg/m   BP Readings from Last 3 Encounters:  04/28/23 132/82  02/14/23 126/70  01/17/23 (!) 150/92    Wt Readings from Last 3  Encounters:  04/28/23 218 lb 6.4 oz (99.1 kg)  02/14/23 220 lb (99.8 kg)  01/17/23 216 lb (98 kg)    Physical Exam Constitutional:      General: He is not in acute distress.    Appearance: He is well-developed. He is obese.     Comments: NAD  Eyes:     Conjunctiva/sclera: Conjunctivae normal.     Pupils: Pupils are equal, round, and reactive to light.  Neck:     Thyroid: No thyromegaly.     Vascular: No JVD.  Cardiovascular:     Rate and Rhythm: Normal rate and regular rhythm.     Heart sounds: Normal heart sounds. No murmur heard.    No friction rub. No gallop.  Pulmonary:     Effort: Pulmonary effort is normal. No respiratory distress.     Breath sounds: Normal breath sounds. No wheezing or rales.  Chest:     Chest wall: No tenderness.  Abdominal:     General: Bowel sounds are normal. There is no distension.     Palpations: Abdomen is soft. There is no mass.     Tenderness: There is no abdominal tenderness. There is no guarding or rebound.  Musculoskeletal:        General: No tenderness. Normal range of motion.     Cervical back: Normal range of motion.  Lymphadenopathy:     Cervical: No cervical adenopathy.  Skin:    General: Skin is warm and dry.     Findings: No rash.  Neurological:     Mental Status: He is alert and oriented to person, place, and time.     Cranial Nerves: No cranial nerve deficit.     Motor: No abnormal muscle tone.     Coordination: Coordination normal.     Gait: Gait normal.     Deep Tendon Reflexes: Reflexes are normal and symmetric.  Psychiatric:        Behavior: Behavior normal.        Thought Content: Thought content normal.        Judgment: Judgment normal.     Lab Results  Component Value Date   WBC 4.0 01/20/2023   HGB 14.5 01/20/2023   HCT 44.4 01/20/2023   PLT 313.0 01/20/2023   GLUCOSE 93 01/20/2023   CHOL 138 01/20/2023   TRIG 46.0 01/20/2023   HDL 40.00 01/20/2023   LDLCALC 89 01/20/2023   ALT 11 01/20/2023   AST 13  01/20/2023   NA 138 01/20/2023   K 3.8 01/20/2023   CL 105 01/20/2023   CREATININE 1.15 01/20/2023   BUN 14 01/20/2023   CO2 26 01/20/2023   TSH 1.91 01/20/2023   PSA 3.75 01/20/2023   HGBA1C 5.8 01/20/2023    DG Chest 2 View  Result Date: 12/23/2021 CLINICAL DATA:  Chest pain EXAM: CHEST - 2 VIEW COMPARISON:  11/07/2015 FINDINGS: The heart size and mediastinal contours are within normal limits. Both lungs are clear. The visualized skeletal structures are unremarkable. IMPRESSION: No acute abnormality of the lungs. Electronically Signed   By: Jearld Lesch M.D.   On: 12/23/2021 18:43    Assessment & Plan:   Problem List Items Addressed This Visit     Vitamin D deficiency    On Vit D      Reactive depression (situational)    Worse due to prostate cancer - new dx (Dr Logan Bores): Gleason 3+4=7. Discussed      Relevant Medications   LORazepam (ATIVAN) 1 MG tablet   Hypogonadism in male    03/2023 prostate cancer - new dx (Dr Logan Bores): Gleason 3+4=7.       Prostate cancer [facility]) - Primary    03/2023 prostate cancer - new dx (Dr Logan Bores): Gleason 3+4=7. C/o stress w/the above      Relevant Medications   LORazepam (ATIVAN) 1 MG tablet      Meds ordered this encounter  Medications   LORazepam (ATIVAN) 1 MG tablet    Sig: Take 1 tablet (1 mg total) by mouth every 6 (six) hours as needed for anxiety.    Dispense:  60 tablet    Refill:  1      Follow-up: Return in about 3 months (around 07/27/2023) for a follow-up visit.  Sonda Primes, MD

## 2023-05-16 ENCOUNTER — Ambulatory Visit: Payer: 59 | Admitting: Internal Medicine

## 2023-05-22 ENCOUNTER — Other Ambulatory Visit: Payer: Self-pay | Admitting: Internal Medicine

## 2023-06-19 ENCOUNTER — Other Ambulatory Visit: Payer: Self-pay | Admitting: Internal Medicine

## 2023-06-21 ENCOUNTER — Other Ambulatory Visit (HOSPITAL_COMMUNITY): Payer: Self-pay

## 2023-06-21 ENCOUNTER — Telehealth: Payer: Self-pay

## 2023-06-21 NOTE — Telephone Encounter (Signed)
Pharmacy Patient Advocate Encounter   Received notification from CoverMyMeds that prior authorization for [facility] 0.5MG /0.5ML auto-injectors is required/requested.   Insurance verification completed.   The patient is insured through [employer] [facility] .   Per test claim: PA required; PA submitted to above mentioned insurance via CoverMyMeds Key/confirmation #/EOC [facility] Status is pending

## 2023-06-23 NOTE — Telephone Encounter (Signed)
Pharmacy Patient Advocate Encounter  Received notification from [employer] [facility] that Prior Authorization for [facility] 0.5MG /0.5ML auto-injectors  has been DENIED.  Full denial letter will be uploaded to the media tab. See denial reason below.       PA #/Case ID/Reference #: [id number]

## 2023-07-04 NOTE — Telephone Encounter (Signed)
 Copied from CRM [id number]. Topic: Clinical - Prescription Issue >> Jul 04, 2023  1:54 PM Corey Oliver wrote: Reason for CRM: Patient was calling in to inquire about his Semaglutide -Weight Management (WEGOVY ) 1 MG/0.5ML SOAJ refill, per notes states insurance denied prior authorization. Please reach out to patient for some clarity, thanks.  [name] [phone]

## 2023-07-06 ENCOUNTER — Other Ambulatory Visit: Payer: Self-pay | Admitting: Internal Medicine

## 2023-07-06 ENCOUNTER — Telehealth: Payer: Self-pay

## 2023-07-06 NOTE — Telephone Encounter (Unsigned)
Copied from CRM [id number]. Topic: Clinical - Prescription Issue >> Jul 04, 2023  1:54 PM Gurney Maxin H wrote: Reason for CRM: Patient was calling in to inquire about his Semaglutide-Weight Management (WEGOVY) 1 MG/0.5ML SOAJ refill, per notes states insurance denied prior authorization. Please reach out to patient for some clarity, thanks.  Stanley [phone] >> Jul 06, 2023  1:42 PM Tiffany H wrote: Patient called to advise that he's having trouble refilling his Wegovy. Patient advised that his prescription was filled at a higher dosage than he takes. Pharmacy denied his refill,.   Patient would like to continue with 0.15ml. Please update prescription and resend. Follow up with patient if he's to continue taking 1ml and advise him where he can pick that strength up, as current pharmacy may not have that strength.   Please assist.

## 2023-07-06 NOTE — Telephone Encounter (Signed)
LVM for pt to call the clinic back in regards to his [place] PA denial... please ask pt what is it that he is needing and how we can help?

## 2023-07-06 NOTE — Telephone Encounter (Signed)
Copied from CRM [id number]. Topic: Clinical - Prescription Issue >> Jul 04, 2023  1:54 PM Gurney Maxin H wrote: Reason for CRM: Patient was calling in to inquire about his Semaglutide-Weight Management (WEGOVY) 1 MG/0.5ML SOAJ refill, per notes states insurance denied prior authorization. Please reach out to patient for some clarity, thanks.  Stanley [phone] >> Jul 06, 2023  1:42 PM Tiffany H wrote: Patient called to advise that he's having trouble refilling his Wegovy. Patient advised that his prescription was filled at a higher dosage than he takes. Pharmacy denied his refill,.   Patient would like to continue with 0.15ml. Please update prescription and resend. Follow up with patient if he's to continue taking 1ml and advise him where he can pick that strength up, as current pharmacy may not have that strength.   Please assist.

## 2023-07-07 ENCOUNTER — Other Ambulatory Visit (HOSPITAL_COMMUNITY): Payer: Self-pay

## 2023-07-07 MED ORDER — WEGOVY 0.5 MG/0.5ML ~~LOC~~ SOAJ: 0.5000 mg | SUBCUTANEOUS | 3 refills | Status: DC

## 2023-07-07 NOTE — Addendum Note (Signed)
Addended by: Delsa Grana R on: 07/07/2023 10:27 AM   Modules accepted: Orders

## 2023-07-07 NOTE — Telephone Encounter (Signed)
I was able to send in the 0.5mg  dose as requested by the pt.

## 2023-07-11 ENCOUNTER — Telehealth (HOSPITAL_COMMUNITY): Payer: Self-pay | Admitting: Pharmacy Technician

## 2023-07-11 ENCOUNTER — Other Ambulatory Visit (HOSPITAL_COMMUNITY): Payer: Self-pay

## 2023-07-11 NOTE — Telephone Encounter (Signed)
Pharmacy Patient Advocate Encounter   Received notification from  [employer]  that prior authorization for [facility] 0.5MG /0.5ML PEN is required/requested.   Insurance verification completed.   The patient is insured through [employer] [facility] .   Per test claim: PA required; PA submitted to above mentioned insurance via CoverMyMeds Key/confirmation #/EOC [id number] Status is pending

## 2023-07-12 ENCOUNTER — Other Ambulatory Visit (HOSPITAL_COMMUNITY): Payer: Self-pay

## 2023-07-12 NOTE — Telephone Encounter (Signed)
Pharmacy Patient Advocate Encounter  Received notification from [employer] [facility] that Prior Authorization for [facility] 5MG /0.5ML PEN has been APPROVED from 07/11/2023 to 02/08/2024. Ran test claim, Copay is $18.00. This test claim was processed through [facility]- copay amounts may vary at other pharmacies due to pharmacy/plan contracts, or as the patient moves through the different stages of their insurance plan.   PA #/Case ID/Reference #: Key: Corey Oliver

## 2023-07-15 ENCOUNTER — Other Ambulatory Visit (HOSPITAL_COMMUNITY): Payer: Self-pay

## 2023-07-15 ENCOUNTER — Telehealth: Payer: Self-pay | Admitting: Pharmacy Technician

## 2023-07-15 NOTE — Telephone Encounter (Signed)
Pharmacy Patient Advocate Encounter   Received notification from CoverMyMeds that prior authorization for DEXILANT 60MG  CAPSULES is required/requested.   Insurance verification completed.   The patient is insured through [employer] [facility] .   Per test claim:  GENERIC DEXLANSOPRAZOLE 60MG  CAPSULES is preferred by the insurance.  If suggested medication is appropriate, Please send in a new RX and discontinue this one. If not, please advise as to why it's not appropriate so that we may request a Prior Authorization. Please note, some preferred medications may still require a PA.  If the suggested medications have not been trialed and there are no contraindications to their use, the PA will not be submitted, as it will not be approved.  PA not needed at this time.

## 2023-08-06 ENCOUNTER — Other Ambulatory Visit: Payer: Self-pay | Admitting: Internal Medicine

## 2023-08-22 ENCOUNTER — Telehealth: Payer: Self-pay | Admitting: Internal Medicine

## 2023-08-22 NOTE — Telephone Encounter (Signed)
 Patient dropped off document FMLA, to be filled out by provider. Patient requested to send it back via Fax within 7-days. Document is located in providers tray at front office.Please advise at Mobile [phone] (mobile)   Patient would like a call when the paperwork is faxed.

## 2023-08-25 ENCOUNTER — Other Ambulatory Visit: Payer: Self-pay | Admitting: Internal Medicine

## 2023-08-25 NOTE — Telephone Encounter (Signed)
 Patient is calling back in regarding his fmla paperwork it needs to be done by feb 5th   and he also is needing his dex medication for acid relfux the dr needs to send over a new prescription that his insurance covers he need's the medication

## 2023-08-26 ENCOUNTER — Other Ambulatory Visit: Payer: Self-pay

## 2023-08-26 ENCOUNTER — Telehealth: Payer: Self-pay

## 2023-08-26 ENCOUNTER — Other Ambulatory Visit (HOSPITAL_COMMUNITY): Payer: Self-pay

## 2023-08-26 DIAGNOSIS — G43909 Migraine, unspecified, not intractable, without status migrainosus: Secondary | ICD-10-CM

## 2023-08-26 NOTE — Telephone Encounter (Signed)
 When speaking to patient about FMLA paper work he also brought up that he would need a refill on his ibuprofen (motrin) 800 mg. I informed him that we currently had him on for 600 mg and I would need an okay from Dr.Plotnikov prior to the dosage change. Please advise

## 2023-08-26 NOTE — Telephone Encounter (Signed)
 I apologize for this reaching you all so late. Spoke with patient and he had informed me that they have tried the generic in the past but it had made him sick. Would prefer the brand name Dexilant if possible. Patient will be completely out of script next week.

## 2023-08-26 NOTE — Telephone Encounter (Signed)
 PA request has been Submitted. New Encounter has been or will be created for follow up. For additional info see Pharmacy Prior Auth telephone encounter from 08/26/23.

## 2023-08-26 NOTE — Telephone Encounter (Signed)
 Called and informed patient that FMLA paper work is currently being completed. Dr.Plotnikov will be out until Monday and all we need left is his signature for this. Will call patient and inform him when form is completed and faxed over.

## 2023-08-26 NOTE — Telephone Encounter (Signed)
 Pharmacy Patient Advocate Encounter   Received notification from Pt Calls Messages that prior authorization for Dexilant 60MG  dr capsules is required/requested.   Insurance verification completed.   The patient is insured through [facility] .   Per test claim: PA required; PA submitted to above mentioned insurance via CoverMyMeds Key/confirmation #/EOC [id number] Status is pending

## 2023-08-26 NOTE — Telephone Encounter (Signed)
 Scratch that on the med change. Patient has had reactions off of the alternatives in the past. Going to work with him to get his prior authorization set on original script

## 2023-08-29 ENCOUNTER — Other Ambulatory Visit (HOSPITAL_COMMUNITY): Payer: Self-pay

## 2023-08-29 MED ORDER — IBUPROFEN 800 MG PO TABS: 800.0000 mg | ORAL_TABLET | Freq: Three times a day (TID) | ORAL | 0 refills | Status: DC | PRN

## 2023-08-29 NOTE — Telephone Encounter (Signed)
 Pharmacy Patient Advocate Encounter  Received notification from [facility] that Prior Authorization for Dexilant 60MG  dr capsules  has been APPROVED from 08/29/23 to 08/28/24. Ran test claim, Copay is $28. This test claim was processed through [facility]- copay amounts may vary at other pharmacies due to pharmacy/plan contracts, or as the patient moves through the different stages of their insurance plan.   PA #/Case ID/Reference #: [id number]

## 2023-08-30 NOTE — Telephone Encounter (Signed)
 Needs PA done, unable to take alternatives due to interactions

## 2023-08-31 MED ORDER — DEXILANT 60 MG PO CPDR: DELAYED_RELEASE_CAPSULE | ORAL | 3 refills | Status: AC

## 2023-08-31 NOTE — Addendum Note (Signed)
 Addended by: Marinus Maw on: 08/31/2023 04:16 PM   Modules accepted: Orders

## 2023-08-31 NOTE — Telephone Encounter (Signed)
 Patient is calling back in regarding his medication he stated the pharmacy said they dont have it and his fmla just need's to be filled out

## 2023-08-31 NOTE — Telephone Encounter (Signed)
 Called pt and advised medication has been resent in for him.  Pt wanted message to be sent to Zack to see if his FMLA forms were ready? He was last told it was completed, just needed signature. He would like a call when forms are completed

## 2023-09-01 NOTE — Telephone Encounter (Signed)
 Being addressed in separate note. Patient also informed me that dexilant was out of stock at current pharmacy. Patient will call around to see which pharmacies do have it in stock.

## 2023-09-01 NOTE — Telephone Encounter (Signed)
 Called and informed patient that form had been misplaced over the weekend. They will be working to either fax it over or come by tomorrow morning to drop off a new copy.

## 2023-09-02 NOTE — Telephone Encounter (Signed)
 FMLA completed and has been placed at front office. Have taken copies if needed.

## 2023-09-05 ENCOUNTER — Other Ambulatory Visit: Payer: Self-pay

## 2023-09-05 ENCOUNTER — Telehealth: Payer: Self-pay

## 2023-09-05 DIAGNOSIS — G43909 Migraine, unspecified, not intractable, without status migrainosus: Secondary | ICD-10-CM

## 2023-09-05 DIAGNOSIS — I10 Essential (primary) hypertension: Secondary | ICD-10-CM

## 2023-09-05 DIAGNOSIS — E538 Deficiency of other specified B group vitamins: Secondary | ICD-10-CM

## 2023-09-05 NOTE — Telephone Encounter (Signed)
 When reviewing FMLA forms with patient they had informed me that they would like to have their labs drawn prior to their follow up with Dr.Plotnikov in May. Okay to do so? And if so what all labs are appropriate?

## 2023-09-05 NOTE — Addendum Note (Signed)
 Addended byVernard Goldberg on: 09/05/2023 10:52 AM   Modules accepted: Orders

## 2023-09-12 ENCOUNTER — Other Ambulatory Visit: Payer: Self-pay

## 2023-09-12 DIAGNOSIS — E559 Vitamin D deficiency, unspecified: Secondary | ICD-10-CM

## 2023-09-12 DIAGNOSIS — I10 Essential (primary) hypertension: Secondary | ICD-10-CM

## 2023-09-12 DIAGNOSIS — C61 Malignant neoplasm of prostate: Secondary | ICD-10-CM

## 2023-09-12 DIAGNOSIS — E538 Deficiency of other specified B group vitamins: Secondary | ICD-10-CM

## 2023-09-12 DIAGNOSIS — G43909 Migraine, unspecified, not intractable, without status migrainosus: Secondary | ICD-10-CM

## 2023-09-12 NOTE — Telephone Encounter (Signed)
 Labs have since been placed under Dr.Plotnikov

## 2023-09-20 NOTE — Telephone Encounter (Signed)
 Called and spoke with patient regarding DMV form for tint exemption for his vehicle. Informed him that it is currently in Dr.Plotnikov's office and that we are waiting for a signature. Will inform patient once form has been signed

## 2023-09-26 ENCOUNTER — Telehealth: Payer: Self-pay

## 2023-09-26 NOTE — Telephone Encounter (Signed)
 Pt has asked if a rx can be sent in for the following medication, hydrocortisone  (ANUSOL -HC) 25 MG suppository due to inflammation flare with his hemorrhoids.

## 2023-09-27 MED ORDER — HYDROCORTISONE ACETATE 25 MG RE SUPP: 25.0000 mg | Freq: Two times a day (BID) | RECTAL | 1 refills | Status: DC

## 2023-09-27 NOTE — Addendum Note (Signed)
 Addended by: Lynniah Janoski V on: 09/27/2023 12:17 AM   Modules accepted: Orders

## 2023-09-28 ENCOUNTER — Telehealth: Payer: Self-pay | Admitting: Internal Medicine

## 2023-09-28 NOTE — Telephone Encounter (Signed)
 Copied from CRM [id number]. Topic: General - Other >> Sep 28, 2023  1:44 PM Trula Gable C wrote: Reason for CRM: Patient called in wanting to speak with Zachary would like for him to give him a callback regarding some forms that was filled out, patient stated he didn't complete one of the forms

## 2023-09-28 NOTE — Telephone Encounter (Signed)
 Called and spoke with patient. They will be returning to the office on Friday for labs and I will complete this form when they arrive.

## 2023-10-03 ENCOUNTER — Other Ambulatory Visit (INDEPENDENT_AMBULATORY_CARE_PROVIDER_SITE_OTHER)

## 2023-10-03 ENCOUNTER — Encounter (HOSPITAL_COMMUNITY): Payer: Self-pay

## 2023-10-03 DIAGNOSIS — E559 Vitamin D deficiency, unspecified: Secondary | ICD-10-CM | POA: Diagnosis not present

## 2023-10-03 DIAGNOSIS — I10 Essential (primary) hypertension: Secondary | ICD-10-CM | POA: Diagnosis not present

## 2023-10-03 DIAGNOSIS — G43909 Migraine, unspecified, not intractable, without status migrainosus: Secondary | ICD-10-CM

## 2023-10-03 DIAGNOSIS — C61 Malignant neoplasm of prostate: Secondary | ICD-10-CM | POA: Diagnosis not present

## 2023-10-03 LAB — HEMOGLOBIN A1C: Hgb A1c MFr Bld: 5.8 % (ref 4.6–6.5)

## 2023-10-03 LAB — LIPID PANEL
Cholesterol: 150 mg/dL (ref 0–200)
HDL: 40.6 mg/dL (ref >39.00)
LDL Cholesterol: 99 mg/dL (ref 0–99)
NonHDL: 109.66
Total CHOL/HDL Ratio: 4
Triglycerides: 51 mg/dL (ref 0.0–149.0)
VLDL: 10.2 mg/dL (ref 0.0–40.0)

## 2023-10-03 LAB — TESTOSTERONE: Testosterone: 307.73 ng/dL (ref 300.00–890.00)

## 2023-10-03 LAB — TSH: TSH: 2.48 u[IU]/mL (ref 0.35–5.50)

## 2023-10-03 LAB — VITAMIN D 25 HYDROXY (VIT D DEFICIENCY, FRACTURES): VITD: 36.93 ng/mL (ref 30.00–100.00)

## 2023-10-04 LAB — COMPREHENSIVE METABOLIC PANEL WITH GFR
AG Ratio: 1.6 (calc) (ref 1.0–2.5)
ALT: 11 U/L (ref 9–46)
AST: 16 U/L (ref 10–35)
Albumin: 4.5 g/dL (ref 3.6–5.1)
Alkaline phosphatase (APISO): 61 U/L (ref 35–144)
BUN/Creatinine Ratio: 14 (calc) (ref 6–22)
BUN: 19 mg/dL (ref 7–25)
CO2: 26 mmol/L (ref 20–32)
Calcium: 9.6 mg/dL (ref 8.6–10.3)
Chloride: 105 mmol/L (ref 98–110)
Creat: 1.36 mg/dL — ABNORMAL HIGH (ref 0.70–1.30)
Globulin: 2.9 g/dL (ref 1.9–3.7)
Glucose, Bld: 103 mg/dL — ABNORMAL HIGH (ref 65–99)
Potassium: 4 mmol/L (ref 3.5–5.3)
Sodium: 138 mmol/L (ref 135–146)
Total Bilirubin: 0.5 mg/dL (ref 0.2–1.2)
Total Protein: 7.4 g/dL (ref 6.1–8.1)
eGFR: 60 mL/min/{1.73_m2} (ref >=60)

## 2023-10-07 ENCOUNTER — Ambulatory Visit: Payer: Self-pay | Admitting: Internal Medicine

## 2023-10-10 ENCOUNTER — Encounter: Admitting: Internal Medicine

## 2023-10-13 ENCOUNTER — Other Ambulatory Visit: Payer: Self-pay

## 2023-10-13 DIAGNOSIS — K644 Residual hemorrhoidal skin tags: Secondary | ICD-10-CM

## 2023-10-13 MED ORDER — HYDROCORTISONE ACETATE 25 MG RE SUPP: 25.0000 mg | Freq: Two times a day (BID) | RECTAL | 1 refills | Status: AC

## 2023-10-13 NOTE — Telephone Encounter (Signed)
 Copied from CRM [id number]. Topic: General - Other >> Oct 13, 2023 12:19 PM Marlan Silva wrote: Reason for CRM: Patient is requesting a call back from [place] in regards to a form he was supposed to fill out, he also has some medication questions for him as well. Can be reached at [phone].

## 2023-10-13 NOTE — Telephone Encounter (Signed)
 Called and spoke with patient. Patient was inquiring on the [facility] form that we had filled out and was informed that we were missing a section with patient's SSN. When checking back through the blank version of this form I found no section asking for the SSN. I had provided patient with our fax number and phone number for the DMV to reach back to us  on what is needed.  Patient also requested to have their suppository script sent to another pharmacy which I have since done so. He let me know that the pharmacist had informed him that insurance might not cover it and he might have to look for other options

## 2023-10-24 ENCOUNTER — Other Ambulatory Visit: Payer: Self-pay | Admitting: Internal Medicine

## 2023-10-29 ENCOUNTER — Other Ambulatory Visit: Payer: Self-pay | Admitting: Internal Medicine

## 2023-10-30 ENCOUNTER — Other Ambulatory Visit: Payer: Self-pay | Admitting: Internal Medicine

## 2023-11-14 ENCOUNTER — Ambulatory Visit (INDEPENDENT_AMBULATORY_CARE_PROVIDER_SITE_OTHER): Admitting: Internal Medicine

## 2023-11-14 ENCOUNTER — Encounter: Admitting: Internal Medicine

## 2023-11-14 ENCOUNTER — Encounter: Payer: Self-pay | Admitting: Internal Medicine

## 2023-11-14 VITALS — BP 118/80 | HR 100 | Temp 97.7°F | Ht 67.0 in | Wt 229.0 lb

## 2023-11-14 DIAGNOSIS — N529 Male erectile dysfunction, unspecified: Secondary | ICD-10-CM

## 2023-11-14 DIAGNOSIS — C61 Malignant neoplasm of prostate: Secondary | ICD-10-CM | POA: Diagnosis not present

## 2023-11-14 DIAGNOSIS — E538 Deficiency of other specified B group vitamins: Secondary | ICD-10-CM

## 2023-11-14 DIAGNOSIS — L819 Disorder of pigmentation, unspecified: Secondary | ICD-10-CM

## 2023-11-14 DIAGNOSIS — E559 Vitamin D deficiency, unspecified: Secondary | ICD-10-CM

## 2023-11-14 MED ORDER — WEGOVY 1 MG/0.5ML ~~LOC~~ SOAJ: 1.0000 mg | SUBCUTANEOUS | 5 refills | Status: AC

## 2023-11-14 NOTE — Assessment & Plan Note (Addendum)
 Treated: prostate cancer - new dx (Dr Janit): Gleason 3+4=7. S/p ablation

## 2023-11-14 NOTE — Progress Notes (Signed)
 Subjective:  Patient ID: Corey Oliver, male    DOB: 12-25-1965  Age: 58 y.o. MRN: [id number]  CC: Annual Exam   HPI NAYEL PURDY presents for skin hyperpigmentation F/u on stress, HAs, HTN Working a lot H/o prostate CA - s/p ablation   Outpatient Medications Prior to Visit  Medication Sig Dispense Refill   acetaminophen  (TYLENOL ) 325 MG tablet Take 2 tablets (650 mg total) by mouth every 6 (six) hours as needed. 30 tablet 0   Albuterol  Sulfate (PROAIR  RESPICLICK) 108 (90 Base) MCG/ACT AEPB Inhale 1-2 puffs into the lungs 4 (four) times daily as needed. 1 each 2   B Complex-Folic Acid (B COMPLEX PLUS) TABS Take 1 tablet by mouth daily. 100 tablet 3   buPROPion  (WELLBUTRIN  XL) 150 MG 24 hr tablet Take 1 tablet (150 mg total) by mouth daily. 90 tablet 1   Cholecalciferol  (VITAMIN D3) 50 MCG (2000 UT) capsule Take 1 capsule (2,000 Units total) by mouth daily. 100 capsule 3   clobetasol ointment (TEMOVATE) 0.05 % Apply topically 3 (three) times a week.     Clobetasol Propionate 0.05 % shampoo Apply topically.     DEXILANT  60 MG capsule TAKE 1 CAPSULE BY MOUTH AS NEEDED 90 capsule 3   finasteride  (PROSCAR ) 5 MG tablet Take 0.5 tablets by mouth daily.     Fluocin-Hydroquinone -Tretinoin  (TRI-LUMA) 0.01-4-0.05 % CREA As directed 30 g 3   fluticasone  (FLONASE ) 50 MCG/ACT nasal spray Place 2 sprays into the nose daily. 16 g 10   hydrocortisone  (ANUSOL -HC) 25 MG suppository Place 1 suppository (25 mg total) rectally 2 (two) times daily. 20 suppository 1   hydrocortisone -pramoxine (ANALPRAM -HC) 2.5-1 % rectal cream Place 1 application rectally 3 (three) times daily. 30 g 0   hydroquinone  4 % cream Apply topically at bedtime.     ibuprofen  (ADVIL ) 800 MG tablet Take 1 tablet (800 mg total) by mouth every 8 (eight) hours as needed. 100 tablet 0   ipratropium (ATROVENT ) 0.03 % nasal spray Place 2 sprays into both nostrils 2 (two) times daily. Do not use for more than 5days. 30 mL 0   LORazepam   (ATIVAN ) 1 MG tablet Take 1 tablet (1 mg total) by mouth every 6 (six) hours as needed for anxiety. 60 tablet 1   ondansetron  (ZOFRAN ) 4 MG tablet Take 1 tablet (4 mg total) by mouth as directed. Take one Zofran  4 mg tablet 30-60 minutes before each prep dose 2 tablet 0   rizatriptan  (MAXALT -MLT) 10 MG disintegrating tablet Take 1 tablet (10 mg total) by mouth as needed for migraine. May repeat in 2 hours if needed 10 tablet 5   Semaglutide -Weight Management (WEGOVY ) 1 MG/0.5ML SOAJ INJECT 1MG  INTO THE SKIN ONCE A WEEK 2 mL 3   tadalafil  (CIALIS ) 5 MG tablet Take 1 tablet (5 mg total) by mouth daily. 90 tablet 3   triamcinolone  ointment (KENALOG ) 0.1 % Apply 1 application topically 2 (two) times daily. 80 g 3   TRIBENZOR  40-10-25 MG TABS TAKE 1 TABLET BY MOUTH EVERY DAY 90 tablet 3   Semaglutide -Weight Management (WEGOVY ) 0.5 MG/0.5ML SOAJ Inject 0.5 mg into the skin once a week. 2 mL 3   No facility-administered medications prior to visit.    ROS: Review of Systems  Constitutional:  Negative for appetite change, fatigue and unexpected weight change.  HENT:  Negative for congestion, nosebleeds, sneezing, sore throat and trouble swallowing.   Eyes:  Negative for itching and visual disturbance.  Respiratory:  Negative for  cough.   Cardiovascular:  Negative for chest pain, palpitations and leg swelling.  Gastrointestinal:  Negative for abdominal distention, blood in stool, diarrhea and nausea.  Genitourinary:  Negative for frequency and hematuria.  Musculoskeletal:  Negative for back pain, gait problem, joint swelling and neck pain.  Skin:  Negative for rash.  Neurological:  Negative for dizziness, tremors, speech difficulty and weakness.  Psychiatric/Behavioral:  Positive for sleep disturbance. Negative for agitation, dysphoric mood and suicidal ideas. The patient is nervous/anxious.     Objective:  BP 118/80   Pulse 100   Temp 97.7 F (36.5 C) (Oral)   Ht 5' 7 (1.702 m)   Wt 229 lb  (103.9 kg)   SpO2 95%   BMI 35.87 kg/m   BP Readings from Last 3 Encounters:  11/14/23 118/80  04/28/23 132/82  02/14/23 126/70    Wt Readings from Last 3 Encounters:  11/14/23 229 lb (103.9 kg)  04/28/23 218 lb 6.4 oz (99.1 kg)  02/14/23 220 lb (99.8 kg)    Physical Exam Constitutional:      General: He is not in acute distress.    Appearance: He is well-developed. He is obese.     Comments: NAD   Eyes:     Conjunctiva/sclera: Conjunctivae normal.     Pupils: Pupils are equal, round, and reactive to light.   Neck:     Thyroid : No thyromegaly.     Vascular: No JVD.   Cardiovascular:     Rate and Rhythm: Normal rate and regular rhythm.     Heart sounds: Normal heart sounds. No murmur heard.    No friction rub. No gallop.  Pulmonary:     Effort: Pulmonary effort is normal. No respiratory distress.     Breath sounds: Normal breath sounds. No wheezing or rales.  Chest:     Chest wall: No tenderness.  Abdominal:     General: Bowel sounds are normal. There is no distension.     Palpations: Abdomen is soft. There is no mass.     Tenderness: There is no abdominal tenderness. There is no guarding or rebound.   Musculoskeletal:        General: No tenderness. Normal range of motion.     Cervical back: Normal range of motion.  Lymphadenopathy:     Cervical: No cervical adenopathy.   Skin:    General: Skin is warm and dry.     Findings: No rash.   Neurological:     Mental Status: He is alert and oriented to person, place, and time.     Cranial Nerves: No cranial nerve deficit.     Motor: No abnormal muscle tone.     Coordination: Coordination normal.     Gait: Gait normal.     Deep Tendon Reflexes: Reflexes are normal and symmetric.   Psychiatric:        Behavior: Behavior normal.        Thought Content: Thought content normal.        Judgment: Judgment normal.     Lab Results  Component Value Date   WBC 4.0 01/20/2023   HGB 14.5 01/20/2023   HCT 44.4  01/20/2023   PLT 313.0 01/20/2023   GLUCOSE 103 (H) 10/03/2023   CHOL 150 10/03/2023   TRIG 51.0 10/03/2023   HDL 40.60 10/03/2023   LDLCALC 99 10/03/2023   ALT 11 10/03/2023   AST 16 10/03/2023   NA 138 10/03/2023   K 4.0 10/03/2023   CL 105 10/03/2023  CREATININE 1.36 (H) 10/03/2023   BUN 19 10/03/2023   CO2 26 10/03/2023   TSH 2.48 10/03/2023   PSA 3.75 01/20/2023   HGBA1C 5.8 10/03/2023    DG Chest 2 View Result Date: 12/23/2021 CLINICAL DATA:  Chest pain EXAM: CHEST - 2 VIEW COMPARISON:  11/07/2015 FINDINGS: The heart size and mediastinal contours are within normal limits. Both lungs are clear. The visualized skeletal structures are unremarkable. IMPRESSION: No acute abnormality of the lungs. Electronically Signed   By: Marolyn JONETTA Jaksch M.D.   On: 12/23/2021 18:43    Assessment & Plan:   Problem List Items Addressed This Visit     Vitamin D  deficiency - Primary   On Vit D      Erectile dysfunction   F/u w/Urology - Dr Janit      B12 deficiency   On B complex       Hyperpigmentation   Pt is using a compound cream      Prostate cancer (HCC)   Treated: prostate cancer - new dx (Dr Janit): Gleason 3+4=7. S/p ablation      Morbid obesity (HCC)   Increase Wegovy  dose - 1 mg/wk      Relevant Medications   Semaglutide -Weight Management (WEGOVY ) 1 MG/0.5ML SOAJ      Meds ordered this encounter  Medications   Semaglutide -Weight Management (WEGOVY ) 1 MG/0.5ML SOAJ    Sig: Inject 1 mg into the skin once a week.    Dispense:  2 mL    Refill:  5      Follow-up: Return in about 3 months (around 02/14/2024) for a follow-up visit.  Marolyn Noel, MD

## 2023-11-14 NOTE — Assessment & Plan Note (Signed)
 On Vit D

## 2023-11-14 NOTE — Assessment & Plan Note (Signed)
F/u w/Urology - Dr Amalia Hailey

## 2023-11-14 NOTE — Assessment & Plan Note (Signed)
 Pt is using a compound cream

## 2023-11-14 NOTE — Assessment & Plan Note (Signed)
 Increase Wegovy  dose - 1 mg/wk

## 2023-11-14 NOTE — Assessment & Plan Note (Signed)
On B complex 

## 2024-01-16 ENCOUNTER — Ambulatory Visit: Admitting: Internal Medicine

## 2024-01-16 ENCOUNTER — Encounter: Payer: Self-pay | Admitting: Internal Medicine

## 2024-01-16 VITALS — BP 138/106 | HR 100 | Temp 98.6°F | Ht 67.0 in | Wt 217.0 lb

## 2024-01-16 DIAGNOSIS — R6882 Decreased libido: Secondary | ICD-10-CM

## 2024-01-16 DIAGNOSIS — G4733 Obstructive sleep apnea (adult) (pediatric): Secondary | ICD-10-CM | POA: Diagnosis not present

## 2024-01-16 DIAGNOSIS — E669 Obesity, unspecified: Secondary | ICD-10-CM | POA: Diagnosis not present

## 2024-01-16 DIAGNOSIS — K219 Gastro-esophageal reflux disease without esophagitis: Secondary | ICD-10-CM

## 2024-01-16 DIAGNOSIS — E538 Deficiency of other specified B group vitamins: Secondary | ICD-10-CM | POA: Diagnosis not present

## 2024-01-16 DIAGNOSIS — G43909 Migraine, unspecified, not intractable, without status migrainosus: Secondary | ICD-10-CM

## 2024-01-16 MED ORDER — BUPROPION HCL ER (XL) 150 MG PO TB24: 150.0000 mg | ORAL_TABLET | Freq: Every day | ORAL | 1 refills | Status: AC

## 2024-01-16 MED ORDER — IBUPROFEN 800 MG PO TABS: 800.0000 mg | ORAL_TABLET | Freq: Three times a day (TID) | ORAL | 0 refills | Status: DC | PRN

## 2024-01-16 NOTE — Assessment & Plan Note (Addendum)
 Better - he lost wt - on  Wegovy  Rx0.5 mg/wk

## 2024-01-16 NOTE — Assessment & Plan Note (Signed)
Dexilant qd

## 2024-01-16 NOTE — Progress Notes (Signed)
 Subjective:  Patient ID: Corey Oliver, male    DOB: [birth year]  Age: 58 y.o. MRN: [id number]  CC: Medical Management of Chronic Issues (3 MNTH F/U )   HPI [name] presents for HTN, prostate cancer, stress, HAs  Outpatient Medications Prior to Visit  Medication Sig Dispense Refill   acetaminophen  (TYLENOL ) 325 MG tablet Take 2 tablets (650 mg total) by mouth every 6 (six) hours as needed. 30 tablet 0   Albuterol  Sulfate (PROAIR  RESPICLICK) 108 (90 Base) MCG/ACT AEPB Inhale 1-2 puffs into the lungs 4 (four) times daily as needed. 1 each 2   B Complex-Folic Acid (B COMPLEX PLUS) TABS Take 1 tablet by mouth daily. 100 tablet 3   Cholecalciferol  (VITAMIN D3) 50 MCG (2000 UT) capsule Take 1 capsule (2,000 Units total) by mouth daily. 100 capsule 3   clobetasol ointment (TEMOVATE) 0.05 % Apply topically 3 (three) times a week.     Clobetasol Propionate 0.05 % shampoo Apply topically.     DEXILANT  60 MG capsule TAKE 1 CAPSULE BY MOUTH AS NEEDED 90 capsule 3   finasteride  (PROSCAR ) 5 MG tablet Take 0.5 tablets by mouth daily.     Fluocin-Hydroquinone -Tretinoin  (TRI-LUMA) 0.01-4-0.05 % CREA As directed 30 g 3   fluticasone  (FLONASE ) 50 MCG/ACT nasal spray Place 2 sprays into the nose daily. 16 g 10   hydrocortisone  (ANUSOL -HC) 25 MG suppository Place 1 suppository (25 mg total) rectally 2 (two) times daily. 20 suppository 1   hydrocortisone -pramoxine (ANALPRAM -HC) 2.5-1 % rectal cream Place 1 application rectally 3 (three) times daily. 30 g 0   hydroquinone  4 % cream Apply topically at bedtime.     ipratropium (ATROVENT ) 0.03 % nasal spray Place 2 sprays into both nostrils 2 (two) times daily. Do not use for more than 5days. 30 mL 0   LORazepam  (ATIVAN ) 1 MG tablet Take 1 tablet (1 mg total) by mouth every 6 (six) hours as needed for anxiety. 60 tablet 1   ondansetron  (ZOFRAN ) 4 MG tablet Take 1 tablet (4 mg total) by mouth as directed. Take one Zofran  4 mg tablet 30-60 minutes before each  prep dose 2 tablet 0   rizatriptan  (MAXALT -MLT) 10 MG disintegrating tablet Take 1 tablet (10 mg total) by mouth as needed for migraine. May repeat in 2 hours if needed 10 tablet 5   Semaglutide -Weight Management (WEGOVY ) 1 MG/0.5ML SOAJ INJECT 1MG  INTO THE SKIN ONCE A WEEK 2 mL 3   Semaglutide -Weight Management (WEGOVY ) 1 MG/0.5ML SOAJ Inject 1 mg into the skin once a week. 2 mL 5   tadalafil  (CIALIS ) 5 MG tablet Take 1 tablet (5 mg total) by mouth daily. 90 tablet 3   triamcinolone  ointment (KENALOG ) 0.1 % Apply 1 application topically 2 (two) times daily. 80 g 3   TRIBENZOR  40-10-25 MG TABS TAKE 1 TABLET BY MOUTH EVERY DAY 90 tablet 3   buPROPion  (WELLBUTRIN  XL) 150 MG 24 hr tablet Take 1 tablet (150 mg total) by mouth daily. 90 tablet 1   ibuprofen  (ADVIL ) 800 MG tablet Take 1 tablet (800 mg total) by mouth every 8 (eight) hours as needed. 100 tablet 0   No facility-administered medications prior to visit.    ROS: Review of Systems  Constitutional:  Positive for unexpected weight change. Negative for appetite change and fatigue.  HENT:  Negative for congestion, nosebleeds, sneezing, sore throat and trouble swallowing.   Eyes:  Negative for itching and visual disturbance.  Respiratory:  Negative for cough.  Cardiovascular:  Negative for chest pain, palpitations and leg swelling.  Gastrointestinal:  Negative for abdominal distention, blood in stool, diarrhea and nausea.  Genitourinary:  Negative for frequency and hematuria.  Musculoskeletal:  Negative for back pain, gait problem, joint swelling and neck pain.  Skin:  Negative for rash.  Neurological:  Negative for dizziness, tremors, speech difficulty and weakness.  Psychiatric/Behavioral:  Negative for agitation, dysphoric mood and sleep disturbance. The patient is not nervous/anxious.     Objective:  BP (!) 138/106   Pulse 100   Temp 98.6 F (37 C) (Oral)   Ht 5' 7 (1.702 m)   Wt 217 lb (98.4 kg)   SpO2 99%   BMI 33.99 kg/m    BP Readings from Last 3 Encounters:  01/16/24 (!) 138/106  11/14/23 118/80  04/28/23 132/82    Wt Readings from Last 3 Encounters:  01/16/24 217 lb (98.4 kg)  11/14/23 229 lb (103.9 kg)  04/28/23 218 lb 6.4 oz (99.1 kg)    Physical Exam Constitutional:      General: He is not in acute distress.    Appearance: He is well-developed. He is obese.     Comments: NAD  Eyes:     Conjunctiva/sclera: Conjunctivae normal.     Pupils: Pupils are equal, round, and reactive to light.  Neck:     Thyroid : No thyromegaly.     Vascular: No JVD.  Cardiovascular:     Rate and Rhythm: Normal rate and regular rhythm.     Heart sounds: Normal heart sounds. No murmur heard.    No friction rub. No gallop.  Pulmonary:     Effort: Pulmonary effort is normal. No respiratory distress.     Breath sounds: Normal breath sounds. No wheezing or rales.  Chest:     Chest wall: No tenderness.  Abdominal:     General: Bowel sounds are normal. There is no distension.     Palpations: Abdomen is soft. There is no mass.     Tenderness: There is no abdominal tenderness. There is no guarding or rebound.  Musculoskeletal:        General: No tenderness. Normal range of motion.     Cervical back: Normal range of motion.  Lymphadenopathy:     Cervical: No cervical adenopathy.  Skin:    General: Skin is warm and dry.     Findings: No rash.  Neurological:     Mental Status: He is alert and oriented to person, place, and time.     Cranial Nerves: No cranial nerve deficit.     Motor: No abnormal muscle tone.     Coordination: Coordination normal.     Gait: Gait normal.     Deep Tendon Reflexes: Reflexes are normal and symmetric.  Psychiatric:        Behavior: Behavior normal.        Thought Content: Thought content normal.        Judgment: Judgment normal.     Lab Results  Component Value Date   WBC 4.0 01/20/2023   HGB 14.5 01/20/2023   HCT 44.4 01/20/2023   PLT 313.0 01/20/2023   GLUCOSE 103 (H)  10/03/2023   CHOL 150 10/03/2023   TRIG 51.0 10/03/2023   HDL 40.60 10/03/2023   LDLCALC 99 10/03/2023   ALT 11 10/03/2023   AST 16 10/03/2023   NA 138 10/03/2023   K 4.0 10/03/2023   CL 105 10/03/2023   CREATININE 1.36 (H) 10/03/2023   BUN 19 10/03/2023  CO2 26 10/03/2023   TSH 2.48 10/03/2023   PSA 3.75 01/20/2023   HGBA1C 5.8 10/03/2023    DG Chest 2 View Result Date: 12/23/2021 CLINICAL DATA:  Chest pain EXAM: CHEST - 2 VIEW COMPARISON:  11/07/2015 FINDINGS: The heart size and mediastinal contours are within normal limits. Both lungs are clear. The visualized skeletal structures are unremarkable. IMPRESSION: No acute abnormality of the lungs. Electronically Signed   By: Marolyn JONETTA Jaksch M.D.   On: 12/23/2021 18:43    Assessment & Plan:   Problem List Items Addressed This Visit     B12 deficiency - Primary   On B complex       GERD   Dexilant  qd      Libido, decreased   F/u w/Dr Janit Not better      Migraines   Relevant Medications   buPROPion  (WELLBUTRIN  XL) 150 MG 24 hr tablet   ibuprofen  (ADVIL ) 800 MG tablet   Obesity (BMI 30-39.9)   Better - he lost wt - on  Wegovy  Rx0.5 mg/wk      Obstructive sleep apnea   Using a CPAP         Meds ordered this encounter  Medications   buPROPion  (WELLBUTRIN  XL) 150 MG 24 hr tablet    Sig: Take 1 tablet (150 mg total) by mouth daily.    Dispense:  90 tablet    Refill:  1   ibuprofen  (ADVIL ) 800 MG tablet    Sig: Take 1 tablet (800 mg total) by mouth every 8 (eight) hours as needed.    Dispense:  100 tablet    Refill:  0      Follow-up: Return in about 6 months (around 07/18/2024) for a follow-up visit, Wellness Exam.  Marolyn Noel, MD

## 2024-01-16 NOTE — Assessment & Plan Note (Signed)
F/u w/Dr Logan Bores Not better

## 2024-01-16 NOTE — Assessment & Plan Note (Signed)
 On B complex

## 2024-01-16 NOTE — Assessment & Plan Note (Signed)
 Using a CPAP

## 2024-02-15 ENCOUNTER — Other Ambulatory Visit: Payer: Self-pay | Admitting: Internal Medicine

## 2024-02-15 DIAGNOSIS — G43909 Migraine, unspecified, not intractable, without status migrainosus: Secondary | ICD-10-CM

## 2024-03-13 ENCOUNTER — Telehealth: Payer: Self-pay

## 2024-03-13 ENCOUNTER — Other Ambulatory Visit (HOSPITAL_COMMUNITY): Payer: Self-pay

## 2024-03-13 NOTE — Telephone Encounter (Signed)
 Pharmacy Patient Advocate Encounter   Received notification from Onbase that prior authorization for Wegovy  1MG /0.5ML auto-injectors  is required/requested.   Insurance verification completed.   The patient is insured through [facility].   Per test claim: PA required; PA submitted to above mentioned insurance via Latent Key/confirmation #/EOC [id number] Status is pending

## 2024-03-14 ENCOUNTER — Other Ambulatory Visit (HOSPITAL_COMMUNITY): Payer: Self-pay

## 2024-03-14 NOTE — Telephone Encounter (Signed)
 Pharmacy Patient Advocate Encounter  Received notification from [facility] that Prior Authorization for Wegovy  1MG /0.5ML auto-injectors  has been APPROVED from 03/13/24 to 03/13/25. Unable to obtain price due to refill too soon rejection, last fill date 03/13/24 next available fill date11/11/25   PA #/Case ID/Reference #: [id number]

## 2024-04-04 ENCOUNTER — Other Ambulatory Visit: Payer: Self-pay | Admitting: Internal Medicine

## 2024-04-04 DIAGNOSIS — G43909 Migraine, unspecified, not intractable, without status migrainosus: Secondary | ICD-10-CM

## 2024-04-17 ENCOUNTER — Ambulatory Visit: Admitting: Internal Medicine

## 2024-04-17 ENCOUNTER — Encounter: Payer: Self-pay | Admitting: Internal Medicine

## 2024-04-17 VITALS — BP 164/102 | HR 107 | Ht 67.0 in | Wt 219.4 lb

## 2024-04-17 DIAGNOSIS — G8929 Other chronic pain: Secondary | ICD-10-CM

## 2024-04-17 DIAGNOSIS — J0100 Acute maxillary sinusitis, unspecified: Secondary | ICD-10-CM | POA: Diagnosis not present

## 2024-04-17 DIAGNOSIS — M25512 Pain in left shoulder: Secondary | ICD-10-CM | POA: Diagnosis not present

## 2024-04-17 MED ORDER — HYDROCODONE BIT-HOMATROP MBR 5-1.5 MG/5ML PO SOLN: 5.0000 mL | Freq: Three times a day (TID) | ORAL | 0 refills | Status: AC | PRN

## 2024-04-17 MED ORDER — AMOXICILLIN-POT CLAVULANATE 875-125 MG PO TABS: 1.0000 | ORAL_TABLET | Freq: Two times a day (BID) | ORAL | 0 refills | Status: AC

## 2024-04-17 NOTE — Assessment & Plan Note (Addendum)
 New Start Augmentin  x10 d Choking on phlegm - Hycodan pre

## 2024-04-17 NOTE — Patient Instructions (Signed)

## 2024-04-17 NOTE — Assessment & Plan Note (Signed)
 Worse Will ref to [facility] (he thinks he saw Dr Dozier in the past) - needs PT

## 2024-04-17 NOTE — Progress Notes (Signed)
 Subjective:  Patient ID: Corey Oliver, male    DOB: 11/14/1965  Age: 58 y.o. MRN: [id number]  CC: Medical Management of Chronic Issues (Migraines, FMLA, and referral for sports medicine (rotator cuff))   HPI Deland A Fantini presents for L shoulder pain, depression, HAs, HTN F/u on OSA on CPAP  Outpatient Medications Prior to Visit  Medication Sig Dispense Refill   acetaminophen  (TYLENOL ) 325 MG tablet Take 2 tablets (650 mg total) by mouth every 6 (six) hours as needed. 30 tablet 0   Albuterol  Sulfate (PROAIR  RESPICLICK) 108 (90 Base) MCG/ACT AEPB Inhale 1-2 puffs into the lungs 4 (four) times daily as needed. 1 each 2   B Complex-Folic Acid (B COMPLEX PLUS) TABS Take 1 tablet by mouth daily. 100 tablet 3   buPROPion  (WELLBUTRIN  XL) 150 MG 24 hr tablet Take 1 tablet (150 mg total) by mouth daily. 90 tablet 1   Cholecalciferol  (VITAMIN D3) 50 MCG (2000 UT) capsule Take 1 capsule (2,000 Units total) by mouth daily. 100 capsule 3   clobetasol ointment (TEMOVATE) 0.05 % Apply topically 3 (three) times a week.     Clobetasol Propionate 0.05 % shampoo Apply topically.     DEXILANT  60 MG capsule TAKE 1 CAPSULE BY MOUTH AS NEEDED 90 capsule 3   finasteride  (PROSCAR ) 5 MG tablet Take 0.5 tablets by mouth daily.     Fluocin-Hydroquinone -Tretinoin  (TRI-LUMA) 0.01-4-0.05 % CREA As directed 30 g 3   fluticasone  (FLONASE ) 50 MCG/ACT nasal spray Place 2 sprays into the nose daily. 16 g 10   hydrocortisone  (ANUSOL -HC) 25 MG suppository Place 1 suppository (25 mg total) rectally 2 (two) times daily. 20 suppository 1   hydrocortisone -pramoxine (ANALPRAM -HC) 2.5-1 % rectal cream Place 1 application rectally 3 (three) times daily. 30 g 0   hydroquinone  4 % cream Apply topically at bedtime.     ibuprofen  (ADVIL ) 800 MG tablet TAKE 1 TABLET BY MOUTH EVERY 8 HOURS AS NEEDED 100 tablet 0   ipratropium (ATROVENT ) 0.03 % nasal spray Place 2 sprays into both nostrils 2 (two) times daily. Do not use for more than  5days. 30 mL 0   LORazepam  (ATIVAN ) 1 MG tablet Take 1 tablet (1 mg total) by mouth every 6 (six) hours as needed for anxiety. 60 tablet 1   ondansetron  (ZOFRAN ) 4 MG tablet Take 1 tablet (4 mg total) by mouth as directed. Take one Zofran  4 mg tablet 30-60 minutes before each prep dose 2 tablet 0   rizatriptan  (MAXALT -MLT) 10 MG disintegrating tablet Take 1 tablet (10 mg total) by mouth as needed for migraine. May repeat in 2 hours if needed 10 tablet 5   Semaglutide -Weight Management (WEGOVY ) 1 MG/0.5ML SOAJ INJECT 1MG  INTO THE SKIN ONCE A WEEK 2 mL 3   Semaglutide -Weight Management (WEGOVY ) 1 MG/0.5ML SOAJ Inject 1 mg into the skin once a week. 2 mL 5   tadalafil  (CIALIS ) 5 MG tablet Take 1 tablet (5 mg total) by mouth daily. 90 tablet 3   triamcinolone  ointment (KENALOG ) 0.1 % Apply 1 application topically 2 (two) times daily. 80 g 3   TRIBENZOR  40-10-25 MG TABS TAKE 1 TABLET BY MOUTH EVERY DAY 90 tablet 3   No facility-administered medications prior to visit.    ROS: Review of Systems  Constitutional:  Negative for appetite change, fatigue and unexpected weight change.  HENT:  Positive for congestion, postnasal drip, rhinorrhea, sinus pressure and voice change. Negative for nosebleeds, sneezing, sore throat and trouble swallowing.   Eyes:  Negative for itching and visual disturbance.  Respiratory:  Positive for cough.   Cardiovascular:  Negative for chest pain, palpitations and leg swelling.  Gastrointestinal:  Negative for abdominal distention, blood in stool, diarrhea and nausea.  Genitourinary:  Negative for frequency and hematuria.  Musculoskeletal:  Positive for arthralgias. Negative for back pain, gait problem, joint swelling and neck pain.  Skin:  Negative for rash.  Neurological:  Negative for dizziness, tremors, speech difficulty and weakness.  Psychiatric/Behavioral:  Negative for agitation, dysphoric mood and sleep disturbance. The patient is not nervous/anxious.      Objective:  BP (!) 164/102   Pulse (!) 107   Ht 5' 7 (1.702 m)   Wt 219 lb 6.4 oz (99.5 kg)   SpO2 96%   BMI 34.36 kg/m   BP Readings from Last 3 Encounters:  04/17/24 (!) 164/102  01/16/24 (!) 138/106  11/14/23 118/80    Wt Readings from Last 3 Encounters:  04/17/24 219 lb 6.4 oz (99.5 kg)  01/16/24 217 lb (98.4 kg)  11/14/23 229 lb (103.9 kg)    Physical Exam Constitutional:      General: He is not in acute distress.    Appearance: He is well-developed. He is obese.     Comments: NAD  HENT:     Nose: Congestion and rhinorrhea present.     Mouth/Throat:     Pharynx: Posterior oropharyngeal erythema present. No oropharyngeal exudate.  Eyes:     Conjunctiva/sclera: Conjunctivae normal.     Pupils: Pupils are equal, round, and reactive to light.  Neck:     Thyroid : No thyromegaly.     Vascular: No JVD.  Cardiovascular:     Rate and Rhythm: Normal rate and regular rhythm.     Heart sounds: Normal heart sounds. No murmur heard.    No friction rub. No gallop.  Pulmonary:     Effort: Pulmonary effort is normal. No respiratory distress.     Breath sounds: Normal breath sounds. No wheezing or rales.  Chest:     Chest wall: No tenderness.  Abdominal:     General: Bowel sounds are normal. There is no distension.     Palpations: Abdomen is soft. There is no mass.     Tenderness: There is no abdominal tenderness. There is no guarding or rebound.  Musculoskeletal:        General: Tenderness present. Normal range of motion.     Cervical back: Normal range of motion.  Lymphadenopathy:     Cervical: No cervical adenopathy.  Skin:    General: Skin is warm and dry.     Findings: No rash.  Neurological:     Mental Status: He is alert and oriented to person, place, and time.     Cranial Nerves: No cranial nerve deficit.     Motor: No abnormal muscle tone.     Coordination: Coordination normal.     Gait: Gait normal.     Deep Tendon Reflexes: Reflexes are normal and  symmetric.  Psychiatric:        Behavior: Behavior normal.        Thought Content: Thought content normal.        Judgment: Judgment normal.   L shoulder w/pain  Lab Results  Component Value Date   WBC 4.0 01/20/2023   HGB 14.5 01/20/2023   HCT 44.4 01/20/2023   PLT 313.0 01/20/2023   GLUCOSE 103 (H) 10/03/2023   CHOL 150 10/03/2023   TRIG 51.0 10/03/2023   HDL  40.60 10/03/2023   LDLCALC 99 10/03/2023   ALT 11 10/03/2023   AST 16 10/03/2023   NA 138 10/03/2023   K 4.0 10/03/2023   CL 105 10/03/2023   CREATININE 1.36 (H) 10/03/2023   BUN 19 10/03/2023   CO2 26 10/03/2023   TSH 2.48 10/03/2023   PSA 3.75 01/20/2023   HGBA1C 5.8 10/03/2023    DG Chest 2 View Result Date: 12/23/2021 CLINICAL DATA:  Chest pain EXAM: CHEST - 2 VIEW COMPARISON:  11/07/2015 FINDINGS: The heart size and mediastinal contours are within normal limits. Both lungs are clear. The visualized skeletal structures are unremarkable. IMPRESSION: No acute abnormality of the lungs. Electronically Signed   By: Marolyn JONETTA Jaksch M.D.   On: 12/23/2021 18:43    Assessment & Plan:   Problem List Items Addressed This Visit     Acute sinusitis   New Start Augmentin  x10 d Choking on phlegm - Hycodan pre      Relevant Medications   amoxicillin -clavulanate (AUGMENTIN ) 875-125 MG tablet   HYDROcodone  bit-homatropine (HYCODAN) 5-1.5 MG/5ML syrup   Shoulder pain, left - Primary   Worse Will ref to [facility] (he thinks he saw Dr Dozier in the past) - needs PT      Relevant Orders   Ambulatory referral to Orthopedic Surgery      Meds ordered this encounter  Medications   amoxicillin -clavulanate (AUGMENTIN ) 875-125 MG tablet    Sig: Take 1 tablet by mouth 2 (two) times daily.    Dispense:  20 tablet    Refill:  0   HYDROcodone  bit-homatropine (HYCODAN) 5-1.5 MG/5ML syrup    Sig: Take 5 mLs by mouth every 8 (eight) hours as needed for cough.    Dispense:  240 mL    Refill:  0      Follow-up: Return  in about 3 months (around 07/18/2024) for a follow-up visit.  Marolyn Noel, MD

## 2024-06-15 ENCOUNTER — Other Ambulatory Visit: Payer: Self-pay | Admitting: Internal Medicine

## 2024-06-15 DIAGNOSIS — G43909 Migraine, unspecified, not intractable, without status migrainosus: Secondary | ICD-10-CM

## 2024-07-18 ENCOUNTER — Ambulatory Visit: Admitting: Internal Medicine
